# Patient Record
Sex: Male | Born: 1950 | Race: Black or African American | Hispanic: No | Marital: Single | Smoking: Current some day smoker
Health system: Southern US, Community
[De-identification: ages and names within clinical notes are randomized; demographics above are authoritative.]

## PROBLEM LIST (undated history)

## (undated) DIAGNOSIS — B20 Human immunodeficiency virus [HIV] disease: Secondary | ICD-10-CM

## (undated) DIAGNOSIS — Z21 Asymptomatic human immunodeficiency virus [HIV] infection status: Secondary | ICD-10-CM

## (undated) DIAGNOSIS — E049 Nontoxic goiter, unspecified: Secondary | ICD-10-CM

## (undated) HISTORY — DX: Nontoxic goiter, unspecified: E04.9

## (undated) HISTORY — DX: Human immunodeficiency virus (HIV) disease: B20

## (undated) HISTORY — DX: Asymptomatic human immunodeficiency virus (hiv) infection status: Z21

## (undated) HISTORY — PX: OTHER SURGICAL HISTORY: SHX169

---

## 2015-06-26 ENCOUNTER — Emergency Department (INDEPENDENT_AMBULATORY_CARE_PROVIDER_SITE_OTHER)
Admission: EM | Admit: 2015-06-26 | Discharge: 2015-06-26 | Disposition: A | Discharge status: Home / Self Care | Payer: Worker's Compensation | Source: Home / Self Care | Attending: Family Medicine | Admitting: Family Medicine

## 2015-06-26 ENCOUNTER — Encounter (HOSPITAL_COMMUNITY): Payer: Self-pay | Admitting: Emergency Medicine

## 2015-06-26 DIAGNOSIS — S20412A Abrasion of left back wall of thorax, initial encounter: Secondary | ICD-10-CM

## 2015-06-26 MED ORDER — NAPROXEN 500 MG PO TABS: 500.0000 mg | ORAL_TABLET | Freq: Two times a day (BID) | ORAL | Status: DC

## 2015-06-26 NOTE — ED Provider Notes (Signed)
CSN: [id number]     Arrival date & time 06/26/15  1654 History   First MD Initiated Contact with Patient 06/26/15 1954     Chief Complaint  Patient presents with  . Back Injury   (Consider location/radiation/quality/duration/timing/severity/associated sxs/prior Treatment) The history is provided by the patient. No language interpreter was used.  Patient presents for evaluation following an injury that occurred on the job today at around 4:30pm.  Was hit in the back by a large roll of fabric that came out of place. Did not knock him over. Sustained an abrasion over the L lower back. No trouble walking or using lower extremities. No prior history of back problems or injury that he can recall.  Came for evaluation.  PMHx he denies any past medical history. Does not have medication allergies and takes no medications. Has not taken anything for pain since today's incident. No history of gastritis or GI bleeding.   Social Hx; Drinks alcohol on occasion.   History reviewed. No pertinent past medical history. History reviewed. No pertinent past surgical history. History reviewed. No pertinent family history. Social History  Substance Use Topics  . Smoking status: Current Some Day Smoker -- 0.50 packs/day    Types: Cigarettes  . Smokeless tobacco: None  . Alcohol Use: Yes    Review of Systems  Constitutional: Negative for fever, chills, diaphoresis and fatigue.  Genitourinary:       No history of urinary or fecal incontinence.   Neurological: Negative for weakness.  All other systems reviewed and are negative.   Allergies  Review of patient's allergies indicates no known allergies.  Home Medications   Prior to Admission medications   Medication Sig Start Date End Date Taking? Authorizing Provider  naproxen (NAPROSYN) 500 MG tablet Take 1 tablet (500 mg total) by mouth 2 (two) times daily with a meal. 06/26/15   Willeen Niece, MD   Meds Ordered and Administered this Visit   Medications - No data to display  There were no vitals taken for this visit. No data found.   Physical Exam  Constitutional: He appears well-developed and well-nourished. No distress.  HENT:  Head: Normocephalic and atraumatic.  Musculoskeletal:  Area of redness without laceration along L iliac crest. No paraspinous mm tenderness or bony tenderness over vertebral processes.   UEs full active ROM and strength. Handgrip full and symmetric.   LEs full strength with bilat hip flexion, ab/adduction, and int/ext rotation.  Negative SLR.   Neurological:  Dorsi/plantarflexion bilat intact and full. Sensation grossly intact bilat. DTRs patellar 1+ symmetric bilaterally.     Skin: Skin is warm and dry. He is not diaphoretic.    ED Course  Procedures (including critical care time)  Labs Review Labs Reviewed - No data to display  Imaging Review No results found.   Visual Acuity Review  Right Eye Distance:   Left Eye Distance:   Bilateral Distance:    Right Eye Near:   Left Eye Near:    Bilateral Near:         MDM   1. Abrasion of back, left, initial encounter    Patient with L lower back abrasion, no laceration. MSK exam is unremarkable, I do not believe imaging is necessary.  Ice to area; NSAIDs with food this evening and tomorrow as needed. No restrictions at this time.   Dalbert Mayotte, MD  Willeen Niece, MD 06/26/15 2014

## 2015-06-26 NOTE — Discharge Instructions (Signed)
It is a pleasure to see you today.    For the abrasion of your back, I recommend applying ice to the affected area tonight, and tomorrow if needed.   I am prescribing an anti-inflammatory, NAPROXEN 500mg  tablets, take 1 tablet by mouth twice daily with something to eat.  May cause upset stomach.   No other restrictions for work at this time.

## 2015-06-26 NOTE — ED Notes (Signed)
Patient reports that a ream of paper fell onto his back this afternoon around 4:30 pm. Patient reports the left side of his back hurts. Patient is in NAD.

## 2016-10-07 ENCOUNTER — Ambulatory Visit (HOSPITAL_COMMUNITY)
Admission: EM | Admit: 2016-10-07 | Discharge: 2016-10-07 | Disposition: A | Discharge status: Home / Self Care | Payer: BLUE CROSS/BLUE SHIELD | Attending: Family Medicine | Admitting: Family Medicine

## 2016-10-07 ENCOUNTER — Encounter (HOSPITAL_COMMUNITY): Payer: Self-pay | Admitting: Emergency Medicine

## 2016-10-07 DIAGNOSIS — L03032 Cellulitis of left toe: Secondary | ICD-10-CM | POA: Diagnosis not present

## 2016-10-07 DIAGNOSIS — L732 Hidradenitis suppurativa: Secondary | ICD-10-CM

## 2016-10-07 MED ORDER — SULFAMETHOXAZOLE-TRIMETHOPRIM 800-160 MG PO TABS: 1.0000 | ORAL_TABLET | Freq: Two times a day (BID) | ORAL | 0 refills | Status: AC

## 2016-10-07 NOTE — Discharge Instructions (Addendum)
Today you were diagnosed with the following: 1. Paronychia of toe of left foot   2. Hydradenitis    You have been prescribed prescription medications this visit.   Meds ordered this encounter  Medications   ibuprofen (ADVIL,MOTRIN) 200 MG tablet    Sig: Take 200 mg by mouth every 6 (six) hours as needed.   sulfamethoxazole-trimethoprim (BACTRIM DS,SEPTRA DS) 800-160 MG tablet    Sig: Take 1 tablet by mouth 2 (two) times daily.    Dispense:  14 tablet    Refill:  0   Please take and finish your antibiotic. If you are not improving over the next few days or feel you are worsening please follow up here or the Emergency Department if you are unable to see your regular doctor.

## 2016-10-07 NOTE — ED Provider Notes (Signed)
  [name]   [id number] 10/07/16 Arrival Time: 1212  ASSESSMENT & PLAN:  Today you were diagnosed with the following: 1. Paronychia of toe of left foot   2. Hydradenitis    You have been prescribed prescription medications this visit.   Meds ordered this encounter  Medications  . ibuprofen (ADVIL,MOTRIN) 200 MG tablet    Sig: Take 200 mg by mouth every 6 (six) hours as needed.  . sulfamethoxazole-trimethoprim (BACTRIM DS,SEPTRA DS) 800-160 MG tablet    Sig: Take 1 tablet by mouth 2 (two) times daily.    Dispense:  14 tablet    Refill:  0   Please take and finish your antibiotic.  If you are not improving over the next few days or feel you are worsening please follow up here or the Emergency Department if you are unable to see your regular doctor.  Paronychia of L great toe cleaned with alcohol. #11 scalpel used to gently lift and open. Scant purulent drainage expressed. Not enough to get adequate culture. No complications. Pt tolerated well. No bleeding.  Wound care instructions discussed. Reviewed expectations re: course of current medical issues. Questions answered. Outlined signs and symptoms indicating need for more acute intervention. Patient verbalized understanding. After Visit Summary given.   SUBJECTIVE:  Juan Johnston is a 66 y.o. male who presents with complaint of questionable infection of L great toe. Noticed a few days ago. Slight tenderness. No injury. Afebrile. No drainage.  Also questions abscess in R axilla. Several weeks. Not very painful. No drainage. H/O similar in the past that have drained spontaneously.  ROS: As per HPI.   OBJECTIVE:  Vitals:   10/07/16 1302  BP: (!) 152/85  Pulse: 73  Resp: 20  Temp: 98.1 F (36.7 C)  TempSrc: Oral  SpO2: 100%    General appearance: alert; no distress Extremities: L great toe with paronychia; approx 1 cm around proximal nail; no significant erythema; toe with FROM MSK: symmetrical with  no gross deformities R axilla with approx 1 cm nodule vs cyst under skin; no tenderness; no fluctuance  No Known Allergies  PMHx, SurgHx, SocialHx, Medications, and Allergies were reviewed in the Visit Navigator and updated as appropriate.      Vanessa Kick, MD 10/07/16 1409

## 2016-10-07 NOTE — ED Triage Notes (Signed)
Abscess to right axilla Bilateral great toe pain and swelling around nail bed.

## 2017-02-06 ENCOUNTER — Emergency Department (HOSPITAL_COMMUNITY)
Admission: EM | Admit: 2017-02-06 | Discharge: 2017-02-06 | Disposition: A | Discharge status: Home / Self Care | Payer: Self-pay | Attending: Physician Assistant | Admitting: Physician Assistant

## 2017-02-06 ENCOUNTER — Encounter (HOSPITAL_COMMUNITY): Payer: Self-pay | Admitting: Emergency Medicine

## 2017-02-06 ENCOUNTER — Emergency Department (HOSPITAL_COMMUNITY): Payer: Self-pay

## 2017-02-06 DIAGNOSIS — J189 Pneumonia, unspecified organism: Secondary | ICD-10-CM | POA: Insufficient documentation

## 2017-02-06 DIAGNOSIS — J181 Lobar pneumonia, unspecified organism: Secondary | ICD-10-CM

## 2017-02-06 DIAGNOSIS — F17210 Nicotine dependence, cigarettes, uncomplicated: Secondary | ICD-10-CM | POA: Insufficient documentation

## 2017-02-06 LAB — COMPREHENSIVE METABOLIC PANEL
ALBUMIN: 3.4 g/dL — AB (ref 3.5–5.0)
ALT: 16 U/L — ABNORMAL LOW (ref 17–63)
AST: 18 U/L (ref 15–41)
Alkaline Phosphatase: 91 U/L (ref 38–126)
Anion gap: 10 (ref 5–15)
BUN: 8 mg/dL (ref 6–20)
CHLORIDE: 101 mmol/L (ref 101–111)
CO2: 25 mmol/L (ref 22–32)
CREATININE: 0.86 mg/dL (ref 0.61–1.24)
Calcium: 8.9 mg/dL (ref 8.9–10.3)
GFR calc non Af Amer: >60 mL/min (ref >60)
Glucose, Bld: 120 mg/dL — ABNORMAL HIGH (ref 65–99)
POTASSIUM: 3.6 mmol/L (ref 3.5–5.1)
SODIUM: 136 mmol/L (ref 135–145)
Total Bilirubin: 0.7 mg/dL (ref 0.3–1.2)
Total Protein: 7.2 g/dL (ref 6.5–8.1)

## 2017-02-06 LAB — CBC
HEMATOCRIT: 41.2 % (ref 39.0–52.0)
HEMOGLOBIN: 13.9 g/dL (ref 13.0–17.0)
MCH: 31.5 pg (ref 26.0–34.0)
MCHC: 33.7 g/dL (ref 30.0–36.0)
MCV: 93.4 fL (ref 78.0–100.0)
PLATELETS: 157 10*3/uL (ref 150–400)
RBC: 4.41 MIL/uL (ref 4.22–5.81)
RDW: 12.6 % (ref 11.5–15.5)
WBC: 4.8 10*3/uL (ref 4.0–10.5)

## 2017-02-06 LAB — URINALYSIS, ROUTINE W REFLEX MICROSCOPIC
Bilirubin Urine: NEGATIVE
GLUCOSE, UA: NEGATIVE mg/dL
HGB URINE DIPSTICK: NEGATIVE
Ketones, ur: NEGATIVE mg/dL
LEUKOCYTES UA: NEGATIVE
Nitrite: NEGATIVE
PH: 5 (ref 5.0–8.0)
Protein, ur: NEGATIVE mg/dL
SPECIFIC GRAVITY, URINE: 1.021 (ref 1.005–1.030)

## 2017-02-06 LAB — LIPASE, BLOOD: LIPASE: 41 U/L (ref 11–51)

## 2017-02-06 MED ORDER — AZITHROMYCIN 250 MG PO TABS: ORAL_TABLET | ORAL | 0 refills | Status: DC

## 2017-02-06 MED ORDER — DOXYCYCLINE HYCLATE 100 MG PO CAPS: 100.0000 mg | ORAL_CAPSULE | Freq: Two times a day (BID) | ORAL | 0 refills | Status: AC

## 2017-02-06 NOTE — ED Triage Notes (Signed)
Pt. Stated, I started having left stomach pain for 4 hours this morning. No other symptoms

## 2017-02-06 NOTE — Discharge Instructions (Signed)
Please read and follow all provided instructions.  Your diagnoses today include:  1. Community acquired pneumonia of left lower lobe of lung ([place])     Tests performed today include: Vital signs. See below for your results today.   Medications prescribed:  Take as prescribed   Home care instructions:  Follow any educational materials contained in this packet.  Follow-up instructions: Please follow-up with your primary care provider for further evaluation of symptoms and treatment   Return instructions:  Please return to the Emergency Department if you do not get better, if you get worse, or new symptoms OR  - Fever (temperature greater than 101.47F)  - Bleeding that does not stop with holding pressure to the area    -Severe pain (please note that you may be more sore the day after your accident)  - Chest Pain  - Difficulty breathing  - Severe nausea or vomiting  - Inability to tolerate food and liquids  - Passing out  - Skin becoming red around your wounds  - Change in mental status (confusion or lethargy)  - New numbness or weakness    Please return if you have any other emergent concerns.  Additional Information:  Your vital signs today were: BP 116/74    Pulse 76    Resp 20    Ht 5\' 7"  (1.702 m)    Wt 64.9 kg (143 lb)    SpO2 99%    BMI 22.40 kg/m  If your blood pressure (BP) was elevated above 135/85 this visit, please have this repeated by your doctor within one month. ---------------

## 2017-02-06 NOTE — ED Notes (Signed)
Patient in Xray

## 2017-02-06 NOTE — ED Provider Notes (Signed)
[facility] EMERGENCY DEPARTMENT Provider Note   CSN: [id number] Arrival date & time: 02/06/17  6010     History   Chief Complaint Chief Complaint  Patient presents with  . Abdominal Pain    HPI Juan Johnston is a 66 y.o. male.  HPI  65 y.o. male, presents to the Emergency Department today due to left lower chest pain. Pt states this morning. Notes worsening with deep breathing and with coughing. Denies pain at rest. Denies abdominal pain. No N/V. No diaphoresis. Denies pain currently. Pt does endorse non productive cough. No sick contacts. No fevers. No rhinorrhea or sinus congestion. No meds PTA. Otherwise unremarkable medical history. No hx ACS. No FH. No hx DVT/PE. No recent travel. No recent surgeries. No other symptoms noted    History reviewed. No pertinent past medical history.  There are no active problems to display for this patient.   History reviewed. No pertinent surgical history.     Home Medications    Prior to Admission medications   Medication Sig Start Date End Date Taking? Authorizing Provider  ibuprofen (ADVIL,MOTRIN) 200 MG tablet Take 200 mg by mouth every 6 (six) hours as needed.    [provider]  naproxen (NAPROSYN) 500 MG tablet Take 1 tablet (500 mg total) by mouth 2 (two) times daily with a meal. 06/26/15   Willeen Niece, MD    Family History No family history on file.  Social History Social History   Tobacco Use  . Smoking status: Current Some Day Smoker    Packs/day: 0.50    Types: Cigarettes  . Smokeless tobacco: Current User  Substance Use Topics  . Alcohol use: Yes  . Drug use: Not on file     Allergies   Patient has no known allergies.   Review of Systems Review of Systems ROS reviewed and all are negative for acute change except as noted in the HPI.  Physical Exam Updated Vital Signs BP 125/74   Pulse 79   Ht 5\' 7"  (1.702 m)   Wt 64.9 kg (143 lb)   SpO2 97%   BMI 22.40 kg/m    Physical Exam  Constitutional: He is oriented to person, place, and time. Vital signs are normal. He appears well-developed and well-nourished. No distress.  HENT:  Head: Normocephalic and atraumatic.  Right Ear: Hearing, tympanic membrane, external ear and ear canal normal.  Left Ear: Hearing, tympanic membrane, external ear and ear canal normal.  Nose: Nose normal.  Mouth/Throat: Uvula is midline, oropharynx is clear and moist and mucous membranes are normal. No trismus in the jaw. No oropharyngeal exudate, posterior oropharyngeal erythema or tonsillar abscesses.  Eyes: Conjunctivae and EOM are normal. Pupils are equal, round, and reactive to light.  Neck: Normal range of motion. Neck supple. No tracheal deviation present.  Cardiovascular: Normal rate, regular rhythm, S1 normal, S2 normal, normal heart sounds, intact distal pulses and normal pulses.  Pulmonary/Chest: Effort normal and breath sounds normal. No respiratory distress. He has no decreased breath sounds. He has no wheezes. He has no rhonchi. He has no rales.  Abdominal: Normal appearance and bowel sounds are normal. There is no tenderness. There is no rigidity, no rebound, no guarding, no CVA tenderness, no tenderness at McBurney's point and negative Murphy's sign.  Musculoskeletal: Normal range of motion.  Neurological: He is alert and oriented to person, place, and time.  Skin: Skin is warm and dry.  Psychiatric: He has a normal mood and affect. His  speech is normal and behavior is normal. Thought content normal.  Nursing note and vitals reviewed.    ED Treatments / Results  Labs (all labs ordered are listed, but only abnormal results are displayed) Labs Reviewed  COMPREHENSIVE METABOLIC PANEL - Abnormal; Notable for the following components:      Result Value   Glucose, Bld 120 (*)    Albumin 3.4 (*)    ALT 16 (*)    All other components within normal limits  LIPASE, BLOOD  CBC  URINALYSIS, ROUTINE W REFLEX  MICROSCOPIC    EKG  EKG Interpretation None       Radiology Dg Chest 2 View  Result Date: 02/06/2017 CLINICAL DATA:  66 year old male with left-sided chest pain. EXAM: CHEST  2 VIEW COMPARISON:  None. FINDINGS: The heart size and mediastinal contours are within normal limits. Focal opacities identified in the left lung base, suspicious for consolidation. The right lung is clear. No sizeable effusions or pneumothorax. The visualized skeletal structures are unremarkable. IMPRESSION: Left basilar opacity suspicious for consolidation. Correlate clinically for signs of infection or trauma. If further imaging evaluation is needed, contrast-enhanced CT would be the study of choice. Electronically Signed   By: Kristopher Oppenheim M.D.   On: 02/06/2017 11:05    Procedures Procedures (including critical care time)  Medications Ordered in ED Medications - No data to display   Initial Impression / Assessment and Plan / ED Course  I have reviewed the triage vital signs and the nursing notes.  Pertinent labs & imaging results that were available during my care of the patient were reviewed by me and considered in my medical decision making (see chart for details).  Final Clinical Impressions(s) / ED Diagnoses  {I have reviewed and evaluated the relevant laboratory values. {I have reviewed and evaluated the relevant imaging studies. {I have interpreted the relevant EKG. {I have reviewed the relevant previous healthcare records.  {I obtained HPI from historian. {Patient discussed with supervising physician.  ED Course:  Assessment: Pt is a 66 y.o. male  presents to the Emergency Department today due to left lower chest pain. Pt states this morning. Notes worsening with deep breathing and with coughing. Denies pain at rest. Denies abdominal pain. No N/V. No diaphoresis. Denies pain currently. Pt does endorse non productive cough. No sick contacts. No fevers. No rhinorrhea or sinus congestion. No meds PTA.  Otherwise unremarkable medical history. No hx ACS. No FH. No hx DVT/PE. No recent travel. No recent surgeries. On exam, pt in NAD. Nontoxic/nonseptic appearing. VSS. Afebrile. Lungs CTA. Heart RRR. Abdomen nontender soft. CBC unremarkable. BMP unremarkable. CXR with LLL consolidation suspicious for pneumonia. Low suspicion for PE given low risk factors. Plan is to DC home with ABX and close follow up to PCP. Given strict return precautions if symptoms do not improve with ABX. At time of discharge, Patient is in no acute distress. Vital Signs are stable. Patient is able to ambulate. Patient able to tolerate PO.   Disposition/Plan:  DC Home Additional Verbal discharge instructions given and discussed with patient.  Pt Instructed to f/u with PCP in the next week for evaluation and treatment of symptoms. Return precautions given Pt acknowledges and agrees with plan  Supervising Physician Mackuen, Courteney Lyn, *  Final diagnoses:  Community acquired pneumonia of left lower lobe of lung ([place])    New Prescriptions This SmartLink is deprecated. Use AVSMEDLIST instead to display the medication list for a patient.   Shary Decamp, PA-C 02/06/17 1119  Macarthur Critchley, MD 02/08/17 [phone]

## 2017-02-13 ENCOUNTER — Encounter (HOSPITAL_COMMUNITY): Payer: Self-pay

## 2017-02-13 ENCOUNTER — Emergency Department (HOSPITAL_COMMUNITY)
Admission: EM | Admit: 2017-02-13 | Discharge: 2017-02-13 | Disposition: A | Discharge status: Home / Self Care | Payer: Medicare Other | Attending: Emergency Medicine | Admitting: Emergency Medicine

## 2017-02-13 DIAGNOSIS — Z09 Encounter for follow-up examination after completed treatment for conditions other than malignant neoplasm: Secondary | ICD-10-CM

## 2017-02-13 DIAGNOSIS — B370 Candidal stomatitis: Secondary | ICD-10-CM

## 2017-02-13 MED ORDER — NYSTATIN 100000 UNIT/ML MT SUSP: 500000.0000 [IU] | Freq: Four times a day (QID) | OROMUCOSAL | 0 refills | Status: DC

## 2017-02-13 NOTE — ED Triage Notes (Signed)
Patient here requesting recheck for diagnosis of pneumonia last week, has been taking antibiotic as instructed and complains of some intermittent ongoing left rib pain, NAD

## 2017-02-13 NOTE — ED Provider Notes (Signed)
[facility] EMERGENCY DEPARTMENT Provider Note   CSN: [id number] Arrival date & time: 02/13/17  1531     History   Chief Complaint No chief complaint on file.   HPI Juan Johnston is a 66 y.o. male with recent diagnosis of pneumonia who presents for recheck.  He reports he is still coughing a little bit and having very intermittent left rib pain, however he is feeling much better.  He denies any fevers.  He denies any chest pain or shortness of breath.  He has finished his azithromycin and doxycycline.  He reports it did make him constipated, however he has had a large bowel movement this morning.  He has also had a decreased appetite and feels like he may have lost weight over the course of his illness.  He does not have a primary care provider.  HPI  History reviewed. No pertinent past medical history.  There are no active problems to display for this patient.   History reviewed. No pertinent surgical history.     Home Medications    Prior to Admission medications   Medication Sig Start Date End Date Taking? Authorizing Provider  azithromycin (ZITHROMAX Z-PAK) 250 MG tablet 500mg  PO once daily on day 1. THEN 250mg  PO once daily for 4 days 02/06/17   Shary Decamp, PA-C  doxycycline (VIBRAMYCIN) 100 MG capsule Take 1 capsule (100 mg total) 2 (two) times daily for 7 days by mouth. 02/06/17 02/13/17  Shary Decamp, PA-C  ibuprofen (ADVIL,MOTRIN) 200 MG tablet Take 400 mg every 6 (six) hours as needed by mouth for moderate pain.     [provider]  naproxen (NAPROSYN) 500 MG tablet Take 1 tablet (500 mg total) by mouth 2 (two) times daily with a meal. Patient not taking: Reported on 02/06/2017 06/26/15   Willeen Niece, MD  nystatin (MYCOSTATIN) 100000 UNIT/ML suspension Take 5 mLs (500,000 Units total) 4 (four) times daily by mouth. 02/13/17   Vander Kueker, Bea Graff, PA-C    Family History No family history on file.  Social History Social History    Tobacco Use  . Smoking status: Current Some Day Smoker    Packs/day: 0.50    Types: Cigarettes  . Smokeless tobacco: Current User  Substance Use Topics  . Alcohol use: Yes  . Drug use: Not on file     Allergies   Patient has no known allergies.   Review of Systems Review of Systems  Constitutional: Positive for appetite change. Negative for fever.  Respiratory: Positive for cough. Negative for shortness of breath.   Cardiovascular: Negative for chest pain.  Musculoskeletal:       L rib pain     Physical Exam Updated Vital Signs BP (!) 154/104 (BP Location: Right Arm)   Pulse 94   Temp (!) 97.5 F (36.4 C) (Oral)   Resp 16   Ht 5\' 7"  (1.702 m)   SpO2 100%   BMI 22.40 kg/m   Physical Exam  Constitutional: He appears well-developed and well-nourished. No distress.  HENT:  Head: Normocephalic and atraumatic.  Mouth/Throat: Oropharynx is clear and moist. No oropharyngeal exudate.  White, coating to bilateral sides of the soft palate (no complaints related  Eyes: Conjunctivae are normal. Pupils are equal, round, and reactive to light. Right eye exhibits no discharge. Left eye exhibits no discharge. No scleral icterus.  Neck: Normal range of motion. Neck supple. No thyromegaly present.  Cardiovascular: Normal rate, regular rhythm, normal heart sounds and intact distal pulses.  Exam reveals no gallop and no friction rub.  No murmur heard. Pulmonary/Chest: Effort normal and breath sounds normal. No stridor. No respiratory distress. He has no wheezes. He has no rales.  Abdominal: Soft. Bowel sounds are normal. He exhibits no distension. There is no tenderness. There is no rebound and no guarding.  Musculoskeletal: He exhibits no edema.  Lymphadenopathy:    He has no cervical adenopathy.  Neurological: He is alert. Coordination normal.  Skin: Skin is warm and dry. No rash noted. He is not diaphoretic. No pallor.  Psychiatric: He has a normal mood and affect.  Nursing  note and vitals reviewed.    ED Treatments / Results  Labs (all labs ordered are listed, but only abnormal results are displayed) Labs Reviewed - No data to display  EKG  EKG Interpretation None       Radiology No results found.  Procedures Procedures (including critical care time)  Medications Ordered in ED Medications - No data to display   Initial Impression / Assessment and Plan / ED Course  I have reviewed the triage vital signs and the nursing notes.  Pertinent labs & imaging results that were available during my care of the patient were reviewed by me and considered in my medical decision making (see chart for details).     Patient with resolving symptoms of pneumonia.  Lungs are clear to auscultation.  Oxygen saturations at 100%.  Patient is afebrile.  Patient does appear to have thrush, potentially from antibiotic treatment over the past week.  Will treat with nystatin suspension.  Patient encouraged to follow-up and establish care with a primary care provider.  He is given resources.  Return precautions discussed.  Patient understands and agrees with plan.  Patient vitals stable throughout ED course and discharged in satisfactory condition. Patient also evaluated by Dr. Laverta Baltimore who guided the patient's management and agrees with plan.  Final Clinical Impressions(s) / ED Diagnoses   Final diagnoses:  Oral candidiasis  Follow-up exam    ED Discharge Orders        Ordered    nystatin (MYCOSTATIN) 100000 UNIT/ML suspension  4 times daily,   Status:  Discontinued     02/13/17 1605    nystatin (MYCOSTATIN) 100000 UNIT/ML suspension  4 times daily     02/13/17 [address] PA-C 02/13/17 1616    Margette Fast, MD 02/14/17 [id number]

## 2017-02-13 NOTE — ED Notes (Signed)
Discharge signature pad not working. Patient understands discharge instructions

## 2017-02-13 NOTE — Discharge Instructions (Signed)
Swish 5 mL of nystatin for as long as possible 4 times daily.  Swallow the medicine.  Do this for 14 days.  Please follow-up and establish care with a primary care provider for further evaluation and treatment.  Please return to the emergency department if you develop any increasing chest pain, shortness of breath, fevers over 100.4 F, or any other new or concerning symptoms.

## 2017-02-26 ENCOUNTER — Emergency Department (HOSPITAL_COMMUNITY)
Admission: EM | Admit: 2017-02-26 | Discharge: 2017-02-26 | Disposition: A | Discharge status: Home / Self Care | Payer: Medicare Other | Attending: Emergency Medicine | Admitting: Emergency Medicine

## 2017-02-26 ENCOUNTER — Emergency Department (HOSPITAL_COMMUNITY): Payer: Medicare Other

## 2017-02-26 ENCOUNTER — Encounter (HOSPITAL_COMMUNITY): Payer: Self-pay

## 2017-02-26 DIAGNOSIS — R0789 Other chest pain: Secondary | ICD-10-CM

## 2017-02-26 DIAGNOSIS — F17228 Nicotine dependence, chewing tobacco, with other nicotine-induced disorders: Secondary | ICD-10-CM | POA: Insufficient documentation

## 2017-02-26 DIAGNOSIS — D649 Anemia, unspecified: Secondary | ICD-10-CM

## 2017-02-26 DIAGNOSIS — F17210 Nicotine dependence, cigarettes, uncomplicated: Secondary | ICD-10-CM | POA: Insufficient documentation

## 2017-02-26 LAB — CBC
HCT: 37 % — ABNORMAL LOW (ref 39.0–52.0)
Hemoglobin: 12 g/dL — ABNORMAL LOW (ref 13.0–17.0)
MCH: 30.3 pg (ref 26.0–34.0)
MCHC: 32.4 g/dL (ref 30.0–36.0)
MCV: 93.4 fL (ref 78.0–100.0)
PLATELETS: 263 10*3/uL (ref 150–400)
RBC: 3.96 MIL/uL — ABNORMAL LOW (ref 4.22–5.81)
RDW: 12.7 % (ref 11.5–15.5)
WBC: 4.1 10*3/uL (ref 4.0–10.5)

## 2017-02-26 LAB — BASIC METABOLIC PANEL
Anion gap: 10 (ref 5–15)
BUN: 11 mg/dL (ref 6–20)
CALCIUM: 9.1 mg/dL (ref 8.9–10.3)
CO2: 27 mmol/L (ref 22–32)
CREATININE: 0.84 mg/dL (ref 0.61–1.24)
Chloride: 99 mmol/L — ABNORMAL LOW (ref 101–111)
GFR calc Af Amer: >60 mL/min (ref >60)
GLUCOSE: 115 mg/dL — AB (ref 65–99)
Potassium: 3.6 mmol/L (ref 3.5–5.1)
SODIUM: 136 mmol/L (ref 135–145)

## 2017-02-26 LAB — I-STAT TROPONIN, ED: TROPONIN I, POC: 0 ng/mL (ref 0.00–0.08)

## 2017-02-26 LAB — POC OCCULT BLOOD, ED: FECAL OCCULT BLD: NEGATIVE

## 2017-02-26 MED ORDER — PANTOPRAZOLE SODIUM 20 MG PO TBEC: 20.0000 mg | DELAYED_RELEASE_TABLET | Freq: Every day | ORAL | 0 refills | Status: DC

## 2017-02-26 MED ORDER — PANTOPRAZOLE SODIUM 40 MG PO TBEC
40.0000 mg | DELAYED_RELEASE_TABLET | Freq: Every day | ORAL | Status: DC
  Administered 2017-02-26: 40 mg via ORAL
  Filled 2017-02-26: qty 1

## 2017-02-26 MED ORDER — GI COCKTAIL ~~LOC~~
30.0000 mL | Freq: Once | ORAL | Status: AC
  Administered 2017-02-26: 30 mL via ORAL
  Filled 2017-02-26: qty 30

## 2017-02-26 NOTE — ED Triage Notes (Signed)
Patient complains of chest discomfort when he eats. States that he feels as if the food is stopping in his chest. Patient had recent tx for pneumonia and also still coughing up yellowish sputum, alert and oriented, NAD

## 2017-02-26 NOTE — ED Notes (Signed)
ED Provider at bedside. 

## 2017-02-26 NOTE — Discharge Instructions (Signed)
Avoid alcohol use and nonsteroidal drugs such as ibuprofen/Aleve/naproxen. Soft/liquid foods until you see gastroenterologist. For pain take Tylenol and acid suppression medications. Have chest xray repeated in 6 weeks.   Please come to the emergency room if you develop recurrent bleeding, uncontrolled pain or worsening symptoms. Follow-up closely with gastroenterology for further workup and to discuss camera to see more details of your pain with eating.

## 2017-02-26 NOTE — ED Provider Notes (Signed)
[facility] EMERGENCY DEPARTMENT Provider Note   CSN: [id number] Arrival date & time: 02/26/17  1437     History   Chief Complaint No chief complaint on file.   HPI Juan Johnston is a 66 y.o. male.  Patient with history of alcohol and cigarette smoking use presents with  chest discomfort.patient has had recurrent pain and difficulty with eating for the past week. No history of similar. Patient's had just over 10 pounds weight loss. Patient is a chronic smoker and alcohol user he stopped 3 weeks ago when he had pneumonia.  Patient's cough is almost resolved without fever or chills.patient did complete antibiotics. Patient denies abdominal pain or blood/melanotic stools.      History reviewed. No pertinent past medical history.  There are no active problems to display for this patient.   History reviewed. No pertinent surgical history.     Home Medications    Prior to Admission medications   Medication Sig Start Date End Date Taking? Authorizing Provider  azithromycin (ZITHROMAX Z-PAK) 250 MG tablet 500mg  PO once daily on day 1. THEN 250mg  PO once daily for 4 days 02/06/17   Shary Decamp, PA-C  ibuprofen (ADVIL,MOTRIN) 200 MG tablet Take 400 mg every 6 (six) hours as needed by mouth for moderate pain.     [provider]  naproxen (NAPROSYN) 500 MG tablet Take 1 tablet (500 mg total) by mouth 2 (two) times daily with a meal. Patient not taking: Reported on 02/06/2017 06/26/15   Willeen Niece, MD  nystatin (MYCOSTATIN) 100000 UNIT/ML suspension Take 5 mLs (500,000 Units total) 4 (four) times daily by mouth. 02/13/17   Law, Bea Graff, PA-C  pantoprazole (PROTONIX) 20 MG tablet Take 1 tablet (20 mg total) by mouth daily. 02/26/17   Elnora Morrison, MD    Family History No family history on file.  Social History Social History   Tobacco Use  . Smoking status: Current Some Day Smoker    Packs/day: 0.50    Types: Cigarettes  . Smokeless  tobacco: Current User  Substance Use Topics  . Alcohol use: Yes  . Drug use: Not on file     Allergies   Patient has no known allergies.   Review of Systems Review of Systems  Constitutional: Negative for chills and fever.  HENT: Negative for congestion.   Eyes: Negative for visual disturbance.  Respiratory: Negative for shortness of breath.   Cardiovascular: Positive for chest pain. Negative for leg swelling.  Gastrointestinal: Negative for abdominal pain and vomiting.  Genitourinary: Negative for dysuria and flank pain.  Musculoskeletal: Negative for back pain, neck pain and neck stiffness.  Skin: Negative for rash.  Neurological: Negative for light-headedness and headaches.      Physical Exam Updated Vital Signs BP 136/75   Pulse 71   Temp 97.9 F (36.6 C) (Oral)   Resp 15   SpO2 97%   Physical Exam  Constitutional: He is oriented to person, place, and time. He appears well-developed and well-nourished.  HENT:  Head: Normocephalic and atraumatic.  Eyes: Conjunctivae are normal. Right eye exhibits no discharge. Left eye exhibits no discharge.  Neck: Normal range of motion. Neck supple. No tracheal deviation present.  Cardiovascular: Normal rate and regular rhythm.  Pulmonary/Chest: Effort normal and breath sounds normal.  Abdominal: Soft. He exhibits no distension. There is no tenderness. There is no guarding.  Musculoskeletal: He exhibits no edema.  Neurological: He is alert and oriented to person, place, and time.  Skin:  Skin is warm. No rash noted.  Psychiatric: He has a normal mood and affect.  Nursing note and vitals reviewed.    ED Treatments / Results  Labs (all labs ordered are listed, but only abnormal results are displayed) Labs Reviewed  BASIC METABOLIC PANEL - Abnormal; Notable for the following components:      Result Value   Chloride 99 (*)    Glucose, Bld 115 (*)    All other components within normal limits  CBC - Abnormal; Notable for the  following components:   RBC 3.96 (*)    Hemoglobin 12.0 (*)    HCT 37.0 (*)    All other components within normal limits  I-STAT TROPONIN, ED  POC OCCULT BLOOD, ED    EKG  EKG Interpretation  Date/Time:  Saturday February 26 2017 14:53:57 EST Ventricular Rate:  85 PR Interval:    QRS Duration: 89 QT Interval:  377 QTC Calculation: 449 R Axis:   28 Text Interpretation:  Sinus rhythm Abnormal R-wave progression, early transition Nonspecific T abnormalities, Baseline wander in lead(s) I III aVR aVL aVF V2 Confirmed by Elnora Morrison [id number]) on 02/26/2017 3:49:29 PM       Radiology Dg Chest 2 View  Result Date: 02/26/2017 CLINICAL DATA:  Midline chest pain with eating and shortness of breath for 3 days. EXAM: CHEST  2 VIEW COMPARISON:  PA and lateral chest 02/06/2017. FINDINGS: Left basilar airspace disease seen on the prior examination has resolved. There is new volume loss in the left lower lobe and pleural thickening or fluid along the lateral aspect of the left chest wall. Right lung is expanded and clear. Heart size is normal. No pneumothorax. IMPRESSION: New pleural thickening or fluid along the lateral aspect of the left chest wall and volume loss in the left lower lobe is likely the sequelae of pneumonia seen on the prior examination. Repeat PA and lateral chest films in 6- 8 weeks to ensure stability recommended. Electronically Signed   By: Inge Rise M.D.   On: 02/26/2017 15:51    Procedures Procedures (including critical care time)  Medications Ordered in ED Medications  pantoprazole (PROTONIX) EC tablet 40 mg (40 mg Oral Given 02/26/17 1634)  gi cocktail (Maalox,Lidocaine,Donnatal) (30 mLs Oral Given 02/26/17 1636)     Initial Impression / Assessment and Plan / ED Course  I have reviewed the triage vital signs and the nursing notes.  Pertinent labs & imaging results that were available during my care of the patient were reviewed by me and considered in my  medical decision making (see chart for details).     Patient presents with discomfort after eating anything more than liquids. Patient has had weight loss and history of smoking. Patient also has mild drop in hemoglobin. Discussed differential diagnosis in importance of avoiding alcohol and following up with gastroenterologist and primary doctor. No indication for admission at this time.  Results and differential diagnosis were discussed with the patient/parent/guardian. Xrays were independently reviewed by myself.  Close follow up outpatient was discussed, comfortable with the plan.   Medications  pantoprazole (PROTONIX) EC tablet 40 mg (40 mg Oral Given 02/26/17 1634)  gi cocktail (Maalox,Lidocaine,Donnatal) (30 mLs Oral Given 02/26/17 1636)    Vitals:   02/26/17 1450 02/26/17 1600 02/26/17 1630  BP: 116/78 (!) 133/95 136/75  Pulse: 90 76 71  Resp:  19 15  Temp: 97.9 F (36.6 C)    TempSrc: Oral    SpO2: 100% 100% 97%  Final diagnoses:  Chest discomfort  Anemia, unspecified type     Final Clinical Impressions(s) / ED Diagnoses   Final diagnoses:  Chest discomfort  Anemia, unspecified type    ED Discharge Orders        Ordered    pantoprazole (PROTONIX) 20 MG tablet  Daily     02/26/17 1636       Elnora Morrison, MD 02/26/17 1650

## 2017-03-03 ENCOUNTER — Other Ambulatory Visit: Payer: Self-pay | Admitting: Gastroenterology

## 2017-03-03 DIAGNOSIS — R1310 Dysphagia, unspecified: Secondary | ICD-10-CM

## 2017-03-07 ENCOUNTER — Ambulatory Visit
Admission: RE | Admit: 2017-03-07 | Discharge: 2017-03-07 | Disposition: A | Discharge status: Home / Self Care | Payer: No Typology Code available for payment source | Source: Ambulatory Visit | Attending: Gastroenterology | Admitting: Gastroenterology

## 2017-03-07 ENCOUNTER — Ambulatory Visit (INDEPENDENT_AMBULATORY_CARE_PROVIDER_SITE_OTHER): Payer: Self-pay | Admitting: Physician Assistant

## 2017-03-07 ENCOUNTER — Other Ambulatory Visit: Payer: Self-pay

## 2017-03-07 ENCOUNTER — Encounter (INDEPENDENT_AMBULATORY_CARE_PROVIDER_SITE_OTHER): Payer: Self-pay | Admitting: Physician Assistant

## 2017-03-07 VITALS — BP 133/77 | HR 91 | Temp 97.9°F | Wt 140.0 lb

## 2017-03-07 DIAGNOSIS — Z114 Encounter for screening for human immunodeficiency virus [HIV]: Secondary | ICD-10-CM

## 2017-03-07 DIAGNOSIS — Z1211 Encounter for screening for malignant neoplasm of colon: Secondary | ICD-10-CM

## 2017-03-07 DIAGNOSIS — R462 Strange and inexplicable behavior: Secondary | ICD-10-CM

## 2017-03-07 DIAGNOSIS — Z131 Encounter for screening for diabetes mellitus: Secondary | ICD-10-CM

## 2017-03-07 DIAGNOSIS — Z9189 Other specified personal risk factors, not elsewhere classified: Secondary | ICD-10-CM

## 2017-03-07 DIAGNOSIS — R1310 Dysphagia, unspecified: Secondary | ICD-10-CM

## 2017-03-07 DIAGNOSIS — Z09 Encounter for follow-up examination after completed treatment for conditions other than malignant neoplasm: Secondary | ICD-10-CM

## 2017-03-07 DIAGNOSIS — R634 Abnormal weight loss: Secondary | ICD-10-CM

## 2017-03-07 DIAGNOSIS — K209 Esophagitis, unspecified without bleeding: Secondary | ICD-10-CM

## 2017-03-07 DIAGNOSIS — Z125 Encounter for screening for malignant neoplasm of prostate: Secondary | ICD-10-CM

## 2017-03-07 DIAGNOSIS — J181 Lobar pneumonia, unspecified organism: Secondary | ICD-10-CM

## 2017-03-07 DIAGNOSIS — E049 Nontoxic goiter, unspecified: Secondary | ICD-10-CM

## 2017-03-07 DIAGNOSIS — R7303 Prediabetes: Secondary | ICD-10-CM

## 2017-03-07 DIAGNOSIS — Z1159 Encounter for screening for other viral diseases: Secondary | ICD-10-CM

## 2017-03-07 DIAGNOSIS — J189 Pneumonia, unspecified organism: Secondary | ICD-10-CM

## 2017-03-07 LAB — POCT GLYCOSYLATED HEMOGLOBIN (HGB A1C): HEMOGLOBIN A1C: 5.8

## 2017-03-07 MED ORDER — PANTOPRAZOLE SODIUM 40 MG PO TBEC
40.0000 mg | DELAYED_RELEASE_TABLET | Freq: Two times a day (BID) | ORAL | 0 refills | Status: DC
Start: 2017-03-07 — End: 2017-07-19

## 2017-03-07 NOTE — Progress Notes (Signed)
Subjective:  Patient ID:  [place], male    DOB: [birth year]  Age: 66 y.o. MRN: [id number]  CC:    Hospital f/u    HPI Juan Johnston is a 66 y.o. male with a medical history of CAP, tobacco use, and oral candidiasis presents as a new patient on ED visit f/u for pneumonia.  ED visit on 02/26/17 (9 days ago). CXR impression of "New pleural thickening or fluid along the lateral aspect of the left chest wall and volume loss in the left lower lobe is likely the sequelae of pneumonia seen on the prior examination. Repeat PA and lateral chest films in 6- 8 weeks to ensure stability recommended". Pt reports feeling better but is concerned about weight loss. Lost 10lbs since having contracted pneumonia. Endorses mild night sweats everyday. Does not endorse extreme fatigue, fever, chills, nausea, vomiting, rash, hematuria, hematochezia, hemoptysis, CP, palpitations, SOB, or abdominal pain..      Outpatient Medications Prior to Visit  Medication Sig Dispense Refill  . pantoprazole (PROTONIX) 20 MG tablet Take 1 tablet (20 mg total) by mouth daily. 30 tablet 0  . azithromycin (ZITHROMAX Z-PAK) 250 MG tablet 500mg  PO once daily on day 1. THEN 250mg  PO once daily for 4 days (Patient not taking: Reported on 02/26/2017) 6 tablet 0  . naproxen (NAPROSYN) 500 MG tablet Take 1 tablet (500 mg total) by mouth 2 (two) times daily with a meal. (Patient not taking: Reported on 02/06/2017) 30 tablet 0  . nystatin (MYCOSTATIN) 100000 UNIT/ML suspension Take 5 mLs (500,000 Units total) 4 (four) times daily by mouth. (Patient not taking: Reported on 02/26/2017) 280 mL 0   No facility-administered medications prior to visit.      ROS Review of Systems  Constitutional: Negative for chills, fever and malaise/fatigue.  Eyes: Negative for blurred vision.  Respiratory: Negative for shortness of breath.   Cardiovascular: Negative for chest pain and palpitations.  Gastrointestinal: Negative for abdominal pain and  nausea.  Genitourinary: Negative for dysuria and hematuria.  Musculoskeletal: Negative for joint pain and myalgias.  Skin: Negative for rash.  Neurological: Negative for tingling and headaches.  Psychiatric/Behavioral: Negative for depression. The patient is not nervous/anxious.     Objective:   Vitals:   03/07/17 1426  BP: 133/77  Pulse: 91  Temp: 97.9 F (36.6 C)  SpO2: 96%      Physical Exam  Constitutional: He is oriented to person, place, and time.  thin, NAD, not well versed, poor comprehension skills.  HENT:  Head: Normocephalic and atraumatic.  Poor dentition  Eyes: No scleral icterus.  Neck: Normal range of motion. Neck supple. Thyromegaly present.  Cardiovascular: Normal rate, regular rhythm and normal heart sounds. Exam reveals no gallop and no friction rub.  No murmur heard. Pulmonary/Chest: Effort normal and breath sounds normal. No respiratory distress. He has no wheezes. He has no rales.  Abdominal: Soft. Bowel sounds are normal. There is no tenderness.  Musculoskeletal: He exhibits no edema.  Neurological: He is alert and oriented to person, place, and time. No cranial nerve deficit. Coordination normal.  Skin: Skin is warm and dry. No rash noted. No erythema. No pallor.  Psychiatric: He has a normal mood and affect. His behavior is normal. Thought content normal.  Vitals reviewed.    Assessment & Plan:     1. Hospital discharge follow-up - CBC with Differential - DG Chest 2 View; Future  2. Pneumonia of left lower lobe due to infectious organism ([place]) - CBC with  Differential - DG Chest 2 View; Future  3. Esophagitis - Severe esophagitis per barium swallow conducted today. Increase pantoprazole to 40 mg BID until his appointment with GI.    4. Screening for diabetes mellitus - HgB A1c 5.8% in clinic today  5. Prediabetes - A1c 5.8% in clinic today.  6. Special screening for malignant neoplasms, colon - Fecal occult blood,  imunochemical  7. Encounter for hepatitis C virus screening test for high risk patient - Hepatitis c antibody (reflex)  8. Screening for prostate cancer - PSA  9. Screening for HIV (human immunodeficiency virus) - HIV antibody  10. Loss of weight - 10 lbs since contracting pneumonia. Will also screen for underlying cause in this high risk homosexual male.   11. Goiter - Thyroid Panel With TSH  12. Strange and inexplicable behavior - Few times patient asked bizarre questions, eg can I keep my phone (before having venipuncture done), also seems to have difficulty in comprehension of simple instruction.  - RPR     Follow-up: 2 weeks for vaccinations.  [name] PA

## 2017-03-09 ENCOUNTER — Encounter: Payer: Self-pay | Admitting: Infectious Diseases

## 2017-03-09 LAB — CBC WITH DIFFERENTIAL/PLATELET
BASOS: 0 %
Basophils Absolute: 0 10*3/uL (ref 0.0–0.2)
EOS (ABSOLUTE): 0.2 10*3/uL (ref 0.0–0.4)
EOS: 6 %
HEMATOCRIT: 35.1 % — AB (ref 37.5–51.0)
Hemoglobin: 11.3 g/dL — ABNORMAL LOW (ref 13.0–17.7)
IMMATURE GRANULOCYTES: 1 %
Immature Grans (Abs): 0 10*3/uL (ref 0.0–0.1)
Lymphocytes Absolute: 0.6 10*3/uL — ABNORMAL LOW (ref 0.7–3.1)
Lymphs: 16 %
MCH: 29.7 pg (ref 26.6–33.0)
MCHC: 32.2 g/dL (ref 31.5–35.7)
MCV: 92 fL (ref 79–97)
MONOS ABS: 0.3 10*3/uL (ref 0.1–0.9)
Monocytes: 9 %
NEUTROS ABS: 2.4 10*3/uL (ref 1.4–7.0)
NEUTROS PCT: 68 %
PLATELETS: 235 10*3/uL (ref 150–379)
RBC: 3.8 x10E6/uL — ABNORMAL LOW (ref 4.14–5.80)
RDW: 13.3 % (ref 12.3–15.4)
WBC: 3.5 10*3/uL (ref 3.4–10.8)

## 2017-03-09 LAB — HEPATITIS C ANTIBODY (REFLEX): HCV Ab: <0.1 s/co ratio (ref 0.0–0.9)

## 2017-03-09 LAB — HIV 1/2 AB DIFFERENTIATION
HIV 1 AB: POSITIVE — AB
HIV 2 Ab: NEGATIVE
NOTE (HIV CONF MULTISPOT): POSITIVE

## 2017-03-09 LAB — THYROID PANEL WITH TSH
Free Thyroxine Index: 1.5 (ref 1.2–4.9)
T3 UPTAKE RATIO: 20 % — AB (ref 24–39)
T4 TOTAL: 7.7 ug/dL (ref 4.5–12.0)
TSH: 2.98 u[IU]/mL (ref 0.450–4.500)

## 2017-03-09 LAB — PSA: PROSTATE SPECIFIC AG, SERUM: 0.7 ng/mL (ref 0.0–4.0)

## 2017-03-09 LAB — HIV ANTIBODY (ROUTINE TESTING W REFLEX): HIV SCREEN 4TH GENERATION: REACTIVE — AB

## 2017-03-09 LAB — RPR: RPR Ser Ql: NONREACTIVE

## 2017-03-09 LAB — HCV COMMENT:

## 2017-03-11 LAB — FECAL OCCULT BLOOD, IMMUNOCHEMICAL: FECAL OCCULT BLD: NEGATIVE

## 2017-04-06 ENCOUNTER — Other Ambulatory Visit: Payer: Self-pay

## 2017-04-06 ENCOUNTER — Ambulatory Visit: Payer: Medicare Other

## 2017-04-08 LAB — T-HELPER CELL (CD4) - (RCID CLINIC ONLY)
CD4 T CELL HELPER: 8 % — AB (ref 33–55)
CD4 T Cell Abs: 70 /uL — ABNORMAL LOW (ref 400–2700)

## 2017-04-08 LAB — QUANTIFERON-TB GOLD PLUS
MITOGEN-NIL: 1.09 [IU]/mL
NIL: 0.04 [IU]/mL
QUANTIFERON-TB GOLD PLUS: NEGATIVE
TB1-NIL: 0.02 [IU]/mL
TB2-NIL: 0.04 [IU]/mL

## 2017-04-11 LAB — HEPATITIS B SURFACE ANTIBODY,QUALITATIVE: Hep B S Ab: NONREACTIVE

## 2017-04-11 LAB — COMPLETE METABOLIC PANEL WITH GFR
AG RATIO: 1.1 (calc) (ref 1.0–2.5)
ALBUMIN MSPROF: 3.9 g/dL (ref 3.6–5.1)
ALT: 19 U/L (ref 9–46)
AST: 19 U/L (ref 10–35)
Alkaline phosphatase (APISO): 81 U/L (ref 40–115)
BUN: 13 mg/dL (ref 7–25)
CALCIUM: 9.4 mg/dL (ref 8.6–10.3)
CO2: 28 mmol/L (ref 20–32)
Chloride: 104 mmol/L (ref 98–110)
Creat: 0.78 mg/dL (ref 0.70–1.25)
GFR, EST AFRICAN AMERICAN: 109 mL/min/{1.73_m2} (ref >=60)
GFR, EST NON AFRICAN AMERICAN: 94 mL/min/{1.73_m2} (ref >=60)
GLOBULIN: 3.5 g/dL (ref 1.9–3.7)
Glucose, Bld: 106 mg/dL — ABNORMAL HIGH (ref 65–99)
POTASSIUM: 4 mmol/L (ref 3.5–5.3)
SODIUM: 140 mmol/L (ref 135–146)
Total Bilirubin: 0.5 mg/dL (ref 0.2–1.2)
Total Protein: 7.4 g/dL (ref 6.1–8.1)

## 2017-04-11 LAB — LIPID PANEL
CHOL/HDL RATIO: 5.2 (calc) — AB (ref <5.0)
CHOLESTEROL: 262 mg/dL — AB (ref <200)
HDL: 50 mg/dL (ref >40)
LDL CHOLESTEROL (CALC): 183 mg/dL — AB
Non-HDL Cholesterol (Calc): 212 mg/dL (calc) — ABNORMAL HIGH (ref <130)
Triglycerides: 145 mg/dL (ref <150)

## 2017-04-11 LAB — CBC WITH DIFFERENTIAL/PLATELET
BASOS PCT: 0.6 %
Basophils Absolute: 21 cells/uL (ref 0–200)
EOS PCT: 10.1 %
Eosinophils Absolute: 354 cells/uL (ref 15–500)
HCT: 37.5 % — ABNORMAL LOW (ref 38.5–50.0)
HEMOGLOBIN: 12.5 g/dL — AB (ref 13.2–17.1)
LYMPHS ABS: 952 {cells}/uL (ref 850–3900)
MCH: 29.9 pg (ref 27.0–33.0)
MCHC: 33.3 g/dL (ref 32.0–36.0)
MCV: 89.7 fL (ref 80.0–100.0)
MONOS PCT: 9 %
MPV: 10.9 fL (ref 7.5–12.5)
NEUTROS ABS: 1859 {cells}/uL (ref 1500–7800)
Neutrophils Relative %: 53.1 %
Platelets: 181 10*3/uL (ref 140–400)
RBC: 4.18 10*6/uL — ABNORMAL LOW (ref 4.20–5.80)
RDW: 14.3 % (ref 11.0–15.0)
Total Lymphocyte: 27.2 %
WBC mixed population: 315 cells/uL (ref 200–950)
WBC: 3.5 10*3/uL — ABNORMAL LOW (ref 3.8–10.8)

## 2017-04-11 LAB — HEPATITIS B SURFACE ANTIGEN: HEP B S AG: NONREACTIVE

## 2017-04-11 LAB — RPR: RPR Ser Ql: NONREACTIVE

## 2017-04-11 LAB — HEPATITIS C ANTIBODY
HEP C AB: NONREACTIVE
SIGNAL TO CUT-OFF: 0.17 (ref <1.00)

## 2017-04-11 LAB — HEPATITIS B CORE ANTIBODY, TOTAL: Hep B Core Total Ab: NONREACTIVE

## 2017-04-11 LAB — HLA B*5701: HLA-B*5701 w/rflx HLA-B High: NEGATIVE

## 2017-04-11 LAB — HEPATITIS A ANTIBODY, TOTAL: Hepatitis A AB,Total: NONREACTIVE

## 2017-04-14 LAB — HIV-1 RNA ULTRAQUANT REFLEX TO GENTYP+
HIV 1 RNA QUANT: 1210000 {copies}/mL — AB
HIV-1 RNA Quant, Log: 6.08 Log cps/mL — ABNORMAL HIGH

## 2017-04-14 LAB — RFLX HIV-1 INTEGRASE GENOTYPE: HIV-1 GENOTYPE: NOT DETECTED

## 2017-04-21 ENCOUNTER — Ambulatory Visit (INDEPENDENT_AMBULATORY_CARE_PROVIDER_SITE_OTHER): Payer: Self-pay | Admitting: Licensed Clinical Social Worker

## 2017-04-21 ENCOUNTER — Encounter: Payer: Self-pay | Admitting: Infectious Diseases

## 2017-04-21 ENCOUNTER — Ambulatory Visit (INDEPENDENT_AMBULATORY_CARE_PROVIDER_SITE_OTHER): Payer: Self-pay | Admitting: Infectious Diseases

## 2017-04-21 ENCOUNTER — Ambulatory Visit: Payer: Medicare Other | Admitting: Pharmacist Clinician (PhC)/ Clinical Pharmacy Specialist

## 2017-04-21 DIAGNOSIS — Z0000 Encounter for general adult medical examination without abnormal findings: Secondary | ICD-10-CM

## 2017-04-21 DIAGNOSIS — E782 Mixed hyperlipidemia: Secondary | ICD-10-CM

## 2017-04-21 DIAGNOSIS — B20 Human immunodeficiency virus [HIV] disease: Secondary | ICD-10-CM

## 2017-04-21 DIAGNOSIS — Z205 Contact with and (suspected) exposure to viral hepatitis: Secondary | ICD-10-CM | POA: Insufficient documentation

## 2017-04-21 DIAGNOSIS — E785 Hyperlipidemia, unspecified: Secondary | ICD-10-CM | POA: Insufficient documentation

## 2017-04-21 DIAGNOSIS — G4700 Insomnia, unspecified: Secondary | ICD-10-CM

## 2017-04-21 MED ORDER — DARUN-COBIC-EMTRICIT-TENOFAF 800-150-200-10 MG PO TABS: 1.0000 | ORAL_TABLET | Freq: Every day | ORAL | 0 refills | Status: DC

## 2017-04-21 MED ORDER — SULFAMETHOXAZOLE-TRIMETHOPRIM 800-160 MG PO TABS: 1.0000 | ORAL_TABLET | Freq: Every day | ORAL | 3 refills | Status: DC

## 2017-04-21 MED ORDER — SULFAMETHOXAZOLE-TRIMETHOPRIM 400-80 MG PO TABS: 1.0000 | ORAL_TABLET | Freq: Every day | ORAL | 5 refills | Status: DC

## 2017-04-21 MED ORDER — DARUN-COBIC-EMTRICIT-TENOFAF 800-150-200-10 MG PO TABS: 1.0000 | ORAL_TABLET | Freq: Every day | ORAL | 3 refills | Status: DC

## 2017-04-21 MED FILL — SYMTUZA 800-150-200-10 MG T: 800-150-200 | 30 days supply | Qty: 30 | Fill #0

## 2017-04-21 MED FILL — SULFAMETHOXAZOLE-TMP DS TAB: 800-160 | 30 days supply | Qty: 30 | Fill #0

## 2017-04-21 NOTE — Patient Instructions (Signed)
We are going to start you on 2 medications today.   Symtuza and Bactrim - you will need to take both of these medications once a day with a full meal.   Please come back in 4 weeks to check in with [place] and see how you are doing. We will recheck labs again at this visit.

## 2017-04-21 NOTE — Assessment & Plan Note (Addendum)
Hold off on recommended vaccines until CD4 reconstitutes. Needs pneumovax, prevnar, hep b. He is newly established with a PCP

## 2017-04-21 NOTE — Assessment & Plan Note (Signed)
His partner is currently undergoing treatment for HCV and they have had unprotected sex. Will re-screen HCV Ab again at future visit.

## 2017-04-21 NOTE — Assessment & Plan Note (Addendum)
New patient here to establish for HIV care. I discussed with Stevie Kern treatment options/side effects, benefits of treatment and long-term outcomes. I discussed how HIV is transmitted and the process of untreated HIV including increased risk for opportunistic infections, cancer, dementia and renal failure. He was counseled on routine HIV care including medication adherence, blood monitoring, necessary vaccines and follow up visits. Counseled regarding safe sex practices including: condom use, partner disclosure, limiting partners. He spent time talking with our pharmacist Minh regarding successful practices of ART and understands to reach out to our clinic in the future with questions. He needs to apply for Medicare Part D ASAP. We can do 30 days of medications with med assist card in the interim.   CD4 count 70 - start Bactrim daily for OI prophylaxis.   His partner's regimen currently Odefsey + Tivicay - will start [place] on [place] today as I fear he has high risk for transmitted resistance. He will return in 4 weeks after starting medications and reassess.

## 2017-04-21 NOTE — Progress Notes (Signed)
Juan Johnston  Aug 30, 1950 [id number] Juan Demark, PA-C   Reason for Visit: Routine HIV Care - Establish Care   Patient Active Problem List   Diagnosis Date Noted  . AIDS (acquired immune deficiency syndrome) (Juan Johnston) 04/21/2017  . Exposure to hepatitis C 04/21/2017  . Healthcare maintenance 04/21/2017  . Hyperlipidemia 04/21/2017    Patient's Medications  New Prescriptions   DARUNAVIR-COBICISCTAT-EMTRICITABINE-TENOFOVIR ALAFENAMIDE (SYMTUZA) 800-150-200-10 MG TABS    Take 1 tablet by mouth daily with breakfast.   SULFAMETHOXAZOLE-TRIMETHOPRIM (BACTRIM) 400-80 MG TABLET    Take 1 tablet by mouth daily.  Previous Medications   PANTOPRAZOLE (PROTONIX) 40 MG TABLET    Take 1 tablet (40 mg total) by mouth 2 (two) times daily.  Modified Medications   No medications on file  Discontinued Medications   No medications on file    HPI:  Juan Johnston is a 67 y.o. AA male with newly diagnosed HIV infection. He is here with his husband of 18 years whom is also HIV positive. He is not surprised at this information. He has no questions surrounding his lab work but is curious to know where his numbers fall. Reports no complaints today suggestive of associated opportunistic infection or advancing HIV disease such as fevers, night sweats, weight loss, anorexia, cough, SOB, nausea, vomiting, diarrhea, headache, sensory changes, lymphadenopathy or oral thrush. He does report a rash to his bilateral lower extremities that he is concerned about. It is flat, non painful, non-pruritic.   He is newly established with a PCP. Esophagitis noted on recent barium swallow however he reports no pain and is not taking protonix as of now.   Review of Systems: Review of Systems  Constitutional: Negative for chills, fever, malaise/fatigue and weight loss.  HENT: Negative for sore throat.        No dental problems  Respiratory: Negative for cough and sputum production.   Cardiovascular: Negative for chest pain and  leg swelling.  Gastrointestinal: Negative for abdominal pain, diarrhea and vomiting.  Genitourinary: Negative for dysuria and flank pain.  Musculoskeletal: Negative for joint pain, myalgias and neck pain.  Skin: Positive for rash.  Neurological: Negative for dizziness, tingling and headaches.  Psychiatric/Behavioral: Negative for depression and substance abuse. The patient is not nervous/anxious and does not have insomnia.     Past Medical History:  Diagnosis Date  . HIV infection (Juan Johnston)     No Known Allergies  Social History   Tobacco Use  . Smoking status: Former Smoker    Packs/day: 0.50    Types: Cigarettes    Last attempt to quit: 02/03/2017    Years since quitting: 0.2  . Smokeless tobacco: Never Used  Substance Use Topics  . Alcohol use: Yes  . Drug use: Not on file    Family History  Problem Relation Age of Onset  . Hypertension Mother     Social History   Substance and Sexual Activity  Sexual Activity Not on file    Physical Exam and Objective Findings:  Vitals:   04/21/17 1402  BP: (!) 147/86  Pulse: 73  Temp: (!) 97.5 F (36.4 C)  TempSrc: Oral  Weight: 147 lb (66.7 kg)   Body mass index is 23.02 kg/m.  Physical Exam  Constitutional: He is oriented to person, place, and time and well-developed, well-nourished, and in no distress.  Thin appearing AA male.   HENT:  Mouth/Throat: No oral lesions. Normal dentition. No dental caries.  Eyes: No scleral icterus.  Cardiovascular: Normal rate, regular rhythm  and normal heart sounds.  Pulmonary/Chest: Effort normal and breath sounds normal.  Abdominal: Soft. He exhibits no distension. There is no tenderness.  Lymphadenopathy:    He has no cervical adenopathy.  Neurological: He is alert and oriented to person, place, and time.  Skin: Skin is warm and dry. No rash noted.  Psychiatric: Mood and affect normal.  Vitals reviewed.   Lab Results Lab Results  Component Value Date   WBC 3.5 (L)  04/06/2017   HGB 12.5 (L) 04/06/2017   HCT 37.5 (L) 04/06/2017   MCV 89.7 04/06/2017   PLT 181 04/06/2017    Lab Results  Component Value Date   CREATININE 0.78 04/06/2017   BUN 13 04/06/2017   NA 140 04/06/2017   K 4.0 04/06/2017   CL 104 04/06/2017   CO2 28 04/06/2017    Lab Results  Component Value Date   ALT 19 04/06/2017   AST 19 04/06/2017   ALKPHOS 91 02/06/2017   BILITOT 0.5 04/06/2017    Lab Results  Component Value Date   CHOL 262 (H) 04/06/2017   HDL 50 04/06/2017   TRIG 145 04/06/2017   CHOLHDL 5.2 (H) 04/06/2017   HIV 1 RNA Quant (Copies/mL)  Date Value  04/06/2017 1,210,000 (H)   CD4 T Cell Abs (/uL)  Date Value  04/06/2017 70 (L)   No results found for: HAV Lab Results  Component Value Date   HEPBSAG NON-REACTIVE 04/06/2017   HEPBSAB NON-REACTIVE 04/06/2017   No results found for: HCVAB No results found for: CHLAMYDIAWP, N No results found for: GCPROBEAPT No results found for: QUANTGOLD No results found for: RPR    Problem List Items Addressed This Visit      Other   AIDS (acquired immune deficiency syndrome) (Juan Johnston)    New patient here to establish for HIV care. I discussed with Stevie Kern treatment options/side effects, benefits of treatment and long-term outcomes. I discussed how HIV is transmitted and the process of untreated HIV including increased risk for opportunistic infections, cancer, dementia and renal failure. He was counseled on routine HIV care including medication adherence, blood monitoring, necessary vaccines and follow up visits. Counseled regarding safe sex practices including: condom use, partner disclosure, limiting partners. He spent time talking with our pharmacist Minh regarding successful practices of ART and understands to reach out to our clinic in the future with questions. He needs to apply for Medicare Part D ASAP. We can do 30 days of medications with med assist card in the interim.   CD4 count 70 - start  Bactrim daily for OI prophylaxis.   His partner's regimen currently Odefsey + Tivicay - will start [place] on [place] today as I fear he has high risk for transmitted resistance. He will return in 4 weeks after starting medications and reassess.         Relevant Orders   HIV-1 RNA ultraquant reflex to gentyp+   Exposure to hepatitis C    His partner is currently undergoing treatment for HCV and they have had unprotected sex. Will re-screen HCV Ab again at future visit.       Healthcare maintenance    Hold off on recommended vaccines until CD4 reconstitutes. Needs pneumovax, prevnar, hep b. He is newly established with a PCP       Hyperlipidemia    He will need to start on statin therapy. Will address this at upcoming visit.         I spent greater than 45 minutes with  the patient today. Greater than 50% of the time spent face-to-face counseling and coordination of care re: HIV and health maintenance.   Janene Madeira, MSN, NP-C [facility] [place] Pager: [phone]  04/21/2017  4:25 PM

## 2017-04-21 NOTE — Progress Notes (Signed)
HPI: Juan Johnston is a 67 y.o. male who is here for his initial visit with Colletta [place] for his HIV  Allergies: No Known Allergies  Vitals: Temp: 97.5 F (36.4 C) (01/17 1402) Temp Source: Oral (01/17 1402) BP: 147/86 (01/17 1402) Pulse Rate: 73 (01/17 1402)  Past Medical History: Past Medical History:  Diagnosis Date  . HIV infection [facility])     Social History: Social History   Socioeconomic History  . Marital status: Single    Spouse name: Not on file  . Number of children: Not on file  . Years of education: Not on file  . Highest education level: Not on file  Social Needs  . Financial resource strain: Not on file  . Food insecurity - worry: Not on file  . Food insecurity - inability: Not on file  . Transportation needs - medical: Not on file  . Transportation needs - non-medical: Not on file  Occupational History  . Not on file  Tobacco Use  . Smoking status: Former Smoker    Packs/day: 0.50    Types: Cigarettes    Last attempt to quit: 02/03/2017    Years since quitting: 0.2  . Smokeless tobacco: Never Used  Substance and Sexual Activity  . Alcohol use: Yes  . Drug use: Not on file  . Sexual activity: Not on file  Other Topics Concern  . Not on file  Social History Narrative  . Not on file    Previous Regimen: None  Current Regimen: None  Labs: HIV 1 RNA Quant (Copies/mL)  Date Value  04/06/2017 1,210,000 (H)   CD4 T Cell Abs (/uL)  Date Value  04/06/2017 70 (L)   Hep B S Ab (no units)  Date Value  04/06/2017 NON-REACTIVE   Hepatitis B Surface Ag (no units)  Date Value  04/06/2017 NON-REACTIVE    CrCl: Estimated Creatinine Clearance: 84.9 mL/min (by C-G formula based on SCr of 0.78 mg/dL).  Lipids:    Component Value Date/Time   CHOL 262 (H) 04/06/2017 1708   TRIG 145 04/06/2017 1708   HDL 50 04/06/2017 1708   CHOLHDL 5.2 (H) 04/06/2017 1708    Assessment: Juan Johnston is here with his partner for his initial HIV visit with our NP.  He has gotten all labs done, however, they couldn't do the genotype for some reason. They were able to do the integrase one. Juan Johnston called them but they didn't have a very good reason of why that is. Therefore, we will repeat it today.   His partner is on [place] and Tivicay, therefore, we are going to use Symtuza for him. Counseled him extensively on adherence to avoid resistance. We were planning to start him on meds today through the voucher card then [facility] until his ADAP is approved. We quickly realized that he will not be qualified through ADAP because he has Medicare Part A. We told him to set up an appt with [facility] to sign up for Medicare Part D. Gave him the address and phone number to [facility] in [place].   Recommendations:  Start Symtuza 1 PO qday today through voucher ADAP submitted on 1/4 Sign up for Medicare Part D Septra SS 1 PO qday at [facility] F/u in 4 wks with Jeanann Lewandowsky, PharmD, BCPS, AAHIVP, CPP Clinical Infectious [place] [facility] 04/21/2017, 4:18 PM

## 2017-04-21 NOTE — Assessment & Plan Note (Signed)
He will need to start on statin therapy. Will address this at upcoming visit.

## 2017-04-25 NOTE — BH Specialist Note (Signed)
Integrated Behavioral Health Initial Visit  MRN: [id number] Name: Juan Johnston  Number of [place] Clinician visits:: 1/6 Session Start time: 3:05 pm  Session End time: 3:15 pm Total time: 10 mins  Type of Service: [place] Interpretor:No. Interpretor Name and Language: N/A   Warm Hand Off Completed.       SUBJECTIVE: Juan Johnston is a 67 y.o. male accompanied by Partner/Significant Other Patient was referred by Janene Madeira for being a new HIV referral.  Patient reports the following symptoms/concerns: Patient reported that he was diagnosed with HIV 3-4 weeks ago in December and has informed his partner and sister, who has been supportive. Patient denied experiencing difficulty with adjusting to his new diagnosis.  Patient reported having good social support and stated that he has friends to talk to when he is stressed.  Patient denied having any current life stressors.  Patient reported sleeping in blocks of 5 hours, then waking up and sleeping for an additional 2-3 hours.  Patient denied being in distress or feeling daytime fatigue and denied need for sleep hygiene resources.  Patient denied need for resources or referrals and denied need for follow up appointment with [facility] and was not willing to schedule.  **Patient did not answer all questions immediately.  His partner provided responses to multiple questions.  Duration of problem: December 2018; Severity of problem: mild  OBJECTIVE: Mood: Euthymic and Affect: Blunt Risk of harm to self or others: No plan to harm self or others  LIFE CONTEXT: Family and Social: Patient lives with his partner. School/Work: Patient does not work. Self-Care: Patient is able to tend to his ADL's. Life Changes: Patient is newly diagnosed with HIV.  ASSESSMENT: Patient is newly diagnosed with HIV and presents as well adjusted.  Patient may benefit from continued medication  education to increase medication and treatment compliance.  Patient is also experiencing sleep issues and may benefit from sleep hygiene.  GOALS ADDRESSED: Patient will: 1. Reduce symptoms of: insomnia 2. Increase knowledge and/or ability of: coping skills and healthy habits  3. Demonstrate ability to: Improve quality and quantity of sleep  INTERVENTIONS: Interventions utilized: Supportive Counseling and Sleep Hygiene   PLAN: 1. Patient will call [facility] if additional assistance or resources are desired.  Sande Rives, [facility]

## 2017-05-03 LAB — HIV-1 RNA ULTRAQUANT REFLEX TO GENTYP+
HIV 1 RNA QUANT: 348000 {copies}/mL — AB
HIV-1 RNA QUANT, LOG: 5.54 {Log_copies}/mL — AB

## 2017-05-03 LAB — RFLX HIV-1 INTEGRASE GENOTYPE: HIV-1 Genotype: DETECTED — AB

## 2017-05-09 ENCOUNTER — Encounter: Payer: Self-pay | Admitting: Behavioral Health

## 2017-05-09 ENCOUNTER — Other Ambulatory Visit: Payer: Self-pay | Admitting: Pharmacist Clinician (PhC)/ Clinical Pharmacy Specialist

## 2017-05-09 MED ORDER — DARUNAVIR-COBICISTAT 800-150 MG PO TABS: 1.0000 | ORAL_TABLET | Freq: Every day | ORAL | 5 refills | Status: DC

## 2017-05-09 MED ORDER — EMTRICITABINE-TENOFOVIR AF 200-25 MG PO TABS: 1.0000 | ORAL_TABLET | Freq: Every day | ORAL | 5 refills | Status: DC

## 2017-05-09 NOTE — Progress Notes (Addendum)
ADAP is now  Approved so we will send Prezcobix and Descovy to [facility]. Told him to finish out Symtuza then start the new regimen. He was still confused so I made him a calendar.

## 2017-05-11 ENCOUNTER — Encounter: Payer: Self-pay | Admitting: Infectious Diseases

## 2017-05-19 ENCOUNTER — Encounter: Payer: Self-pay | Admitting: Infectious Diseases

## 2017-05-19 ENCOUNTER — Other Ambulatory Visit: Payer: Self-pay

## 2017-05-19 ENCOUNTER — Ambulatory Visit (INDEPENDENT_AMBULATORY_CARE_PROVIDER_SITE_OTHER): Payer: Medicare Other | Admitting: Infectious Diseases

## 2017-05-19 VITALS — BP 122/77 | HR 86 | Temp 97.6°F | Ht 68.0 in | Wt 149.0 lb

## 2017-05-19 DIAGNOSIS — B20 Human immunodeficiency virus [HIV] disease: Secondary | ICD-10-CM

## 2017-05-19 DIAGNOSIS — L731 Pseudofolliculitis barbae: Secondary | ICD-10-CM

## 2017-05-19 DIAGNOSIS — L738 Other specified follicular disorders: Secondary | ICD-10-CM

## 2017-05-19 DIAGNOSIS — Z23 Encounter for immunization: Secondary | ICD-10-CM

## 2017-05-19 DIAGNOSIS — D7218 Eosinophilia in diseases classified elsewhere: Secondary | ICD-10-CM | POA: Insufficient documentation

## 2017-05-19 DIAGNOSIS — E049 Nontoxic goiter, unspecified: Secondary | ICD-10-CM

## 2017-05-19 MED ORDER — HYDROXYZINE HCL 25 MG PO TABS: 25.0000 mg | ORAL_TABLET | Freq: Three times a day (TID) | ORAL | 0 refills | Status: DC | PRN

## 2017-05-19 NOTE — Patient Instructions (Signed)
Continue taking your Prezcobix and Descovy every day with food.   Continue taking the Bactrim every day.   I am hopeful that your rash and itching will resolve once we get your immune system working properly - this is something that we see very commonly with people with low CD4 counts that start on medications.   We will check your labs today and see you back in 6 weeks to review. We will call you with anything abnormal should we need to.   Happy Valentine's Day!

## 2017-05-19 NOTE — Progress Notes (Signed)
Name:Juan Johnston  DOB: Nov 01, 1950  MRN: [id number]  PCP: [name], PA-C   Chief Complaint  Patient presents with  . Rash    noticed last week, itchy, chest and back   Patient Active Problem List   Diagnosis Date Noted  . 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 Thyroid goiter 05/19/2017  . AIDS (acquired immune deficiency syndrome) ([place]) 04/21/2017  . Exposure to hepatitis C 04/21/2017  . Healthcare maintenance 04/21/2017  . Hyperlipidemia 04/21/2017    Subjective:  Brief Narrative:  Juan Johnston is a 67 y.o. AA male with HIV infection diagnosed 03-2017. His CD4 nadir was 70 and was with AIDS defining illness (esophageal candidiasis/oral thrush). HIV Risk: MSM / HIV+ partner. OI Hx: esophageal candidiasis.   Genotype:  04-2017: K103N, E138G (R-nevirapine, efavirenz; I-rilpivirine)  HPI:  Juan Johnston is here for follow up with his partner today for HIV. He has done well taking his Prezcobix + Descovy every day. He reports no issues or concerning side effects. Although he is still somewhat fatigued and needs breaks throughout the day he does have some increased energy and has put on a few pounds. Overall feeling better than our last visit. No dysphagia. He has only 2 doses of carafate left then will start on his PPI.   Only complaint is a new rash today that started about 1 week ago. It is mildly pruritic but noticibly more so at night. It is localized to the sides of his neck, upper chest and upper back. No evidence of progression of this on extremities, legs or torso. He has taken nothing for the symptoms.   Review of Systems  Constitutional: Positive for malaise/fatigue. Negative for chills, fever and weight loss.  HENT: Negative for sore throat.        No dental problems  Respiratory: Negative for cough and sputum production.   Cardiovascular: Negative for chest pain and leg swelling.  Gastrointestinal: Negative for abdominal pain, diarrhea and vomiting.  Genitourinary:  Negative for dysuria and flank pain.  Musculoskeletal: Negative for joint pain, myalgias and neck pain.  Skin: Positive for itching and rash.  Neurological: Negative for dizziness, tingling and headaches.  Psychiatric/Behavioral: Negative for depression and substance abuse. The patient is not nervous/anxious and does not have insomnia.     Past Medical History:  Diagnosis Date  . HIV infection ([place])   . Thyroid goiter    Outpatient Medications Prior to Visit  Medication Sig Dispense Refill  . darunavir-cobicistat (PREZCOBIX) 800-150 MG tablet Take 1 tablet by mouth daily with breakfast. Swallow whole. Do NOT crush, break or chew tablets. Take with food. 30 tablet 5  . emtricitabine-tenofovir AF (DESCOVY) 200-25 MG tablet Take 1 tablet by mouth daily. 30 tablet 5  . sulfamethoxazole-trimethoprim (BACTRIM DS,SEPTRA DS) 800-160 MG tablet   5  . pantoprazole (PROTONIX) 40 MG tablet Take 1 tablet (40 mg total) by mouth 2 (two) times daily. (Patient not taking: Reported on 04/21/2017) 60 tablet 0  . sucralfate (CARAFATE) 1 g tablet take 1 tablets by mouth four times a day on empty stomach before meals and at bedtime  0  . sulfamethoxazole-trimethoprim (BACTRIM) 400-80 MG tablet Take 1 tablet by mouth daily. (Patient not taking: Reported on 05/19/2017) 30 tablet 5   No facility-administered medications prior to visit.    No Known Allergies  Social History   Tobacco Use  . Smoking status: Former Smoker    Packs/day: 0.50    Types: Cigarettes    Last attempt to quit: 02/03/2017  Years since quitting: 0.2  . Smokeless tobacco: Never Used  Substance Use Topics  . Alcohol use: Yes  . Drug use: No    Family History  Problem Relation Age of Onset  . Hypertension Mother     Social History   Substance and Sexual Activity  Sexual Activity Yes  . Partners: Male  . Birth control/protection: None    Objective:   Vitals:   05/19/17 1344  BP: 122/77  Pulse: 86  Temp: 97.6 F (36.4  C)  TempSrc: Oral  Weight: 149 lb (67.6 kg)  Height: 5\' 8"  (1.727 m)   Body mass index is 22.66 kg/m.   Physical Exam  Constitutional: He is oriented to person, place, and time and well-developed, well-nourished, and in no distress.  HENT:  Mouth/Throat: Oropharynx is clear and moist. No oral lesions. Normal dentition. No dental caries.  Eyes: Pupils are equal, round, and reactive to light. No scleral icterus.  Neck: Thyromegaly (goiter present ) present.  Cardiovascular: Normal rate, regular rhythm and normal heart sounds.  Pulmonary/Chest: Effort normal and breath sounds normal.  Abdominal: Soft. He exhibits no distension. There is no tenderness.  Lymphadenopathy:    He has cervical adenopathy.  Neurological: He is alert and oriented to person, place, and time.  Skin: Skin is warm and dry. No rash noted.  Psychiatric: Mood and affect normal.  Vitals reviewed.   Lab Results Lab Results  Component Value Date   WBC 3.5 (L) 04/06/2017   HGB 12.5 (L) 04/06/2017   HCT 37.5 (L) 04/06/2017   MCV 89.7 04/06/2017   PLT 181 04/06/2017    Lab Results  Component Value Date   CREATININE 0.78 04/06/2017   BUN 13 04/06/2017   NA 140 04/06/2017   K 4.0 04/06/2017   CL 104 04/06/2017   CO2 28 04/06/2017    Lab Results  Component Value Date   ALT 19 04/06/2017   AST 19 04/06/2017   ALKPHOS 91 02/06/2017   BILITOT 0.5 04/06/2017    Lab Results  Component Value Date   CHOL 262 (H) 04/06/2017   HDL 50 04/06/2017   TRIG 145 04/06/2017   CHOLHDL 5.2 (H) 04/06/2017   HIV 1 RNA Quant (Copies/mL)  Date Value  04/21/2017 348,000 (H)  04/06/2017 1,210,000 (H)   CD4 T Cell Abs (/uL)  Date Value  04/06/2017 70 (L)   No results found for: HAV Lab Results  Component Value Date   HEPBSAG NON-REACTIVE 04/06/2017   HEPBSAB NON-REACTIVE 04/06/2017   No results found for: HCVAB No results found for: CHLAMYDIAWP, N No results found for: GCPROBEAPT No results found for:  QUANTGOLD No results found for: RPR    Problem List Items Addressed This Visit      Endocrine   Thyroid goiter    Large goiter present which appears larger to me today than last time. I will check TSH/Free T4 today to ensure proper function.       Relevant Orders   TSH   T4, free     Musculoskeletal and Integument   Eosinophilic folliculitis    I will send in some hydroxyzine for him to try to help him with the nocturnal itching and sleep. I reassured him that this was likely due to his low CD4 count and possible IRIS-mediated response with applying his HIV medications. It is fairly mild and contained to his neck/upper chest/upper back and less likely related to prezcobix. Possible r/t bactrim. I have asked him to call should  this worsen in severity - will reduce Bactrim to 3x/w or change to Dapsone.         Other   AIDS (acquired immune deficiency syndrome) ([place]) - Primary    He has done well on current regimen. I will continue this and the Bactrim for him until our next visit. I will recheck his VL with INSTI genotype (not done prior) and CD4 today. Recheck CMET for medication monitoring. He has regained 9 lbs and feeling better. Adherence counseling provided today. He will follow up again in 6 weeks with me considering his AIDS status.       Relevant Orders   HIV RNA, RTPCR W/R GT (RTI, PI,INT)   T-helper cell (CD4)- ([facility] clinic only)   COMPLETE METABOLIC PANEL WITH GFR    Other Visit Diagnoses    Need for immunization against influenza       Relevant Orders   Flu Vaccine QUAD 36+ mos IM (Completed)     Health Maintenance = flu vaccine today. I would like to hold on the pneumovax until CD4 > [place], MSN, NP-C [facility] [place] Pager: [phone]  05/19/2017  2:29 PM

## 2017-05-19 NOTE — Assessment & Plan Note (Signed)
I will send in some hydroxyzine for him to try to help him with the nocturnal itching and sleep. I reassured him that this was likely due to his low CD4 count and possible IRIS-mediated response with applying his HIV medications. It is fairly mild and contained to his neck/upper chest/upper back and less likely related to prezcobix. Possible r/t bactrim. I have asked him to call should this worsen in severity - will reduce Bactrim to 3x/w or change to Dapsone.

## 2017-05-19 NOTE — Assessment & Plan Note (Signed)
Large goiter present which appears larger to me today than last time. I will check TSH/Free T4 today to ensure proper function.

## 2017-05-19 NOTE — Assessment & Plan Note (Signed)
He has done well on current regimen. I will continue this and the Bactrim for him until our next visit. I will recheck his VL with INSTI genotype (not done prior) and CD4 today. Recheck CMET for medication monitoring. He has regained 9 lbs and feeling better. Adherence counseling provided today. He will follow up again in 6 weeks with me considering his AIDS status.

## 2017-05-20 LAB — COMPLETE METABOLIC PANEL WITH GFR
AG RATIO: 1.3 (calc) (ref 1.0–2.5)
ALBUMIN MSPROF: 4.3 g/dL (ref 3.6–5.1)
ALT: 22 U/L (ref 9–46)
AST: 16 U/L (ref 10–35)
Alkaline phosphatase (APISO): 63 U/L (ref 40–115)
BUN: 18 mg/dL (ref 7–25)
CO2: 26 mmol/L (ref 20–32)
CREATININE: 1.02 mg/dL (ref 0.70–1.25)
Calcium: 9.8 mg/dL (ref 8.6–10.3)
Chloride: 101 mmol/L (ref 98–110)
GFR, EST AFRICAN AMERICAN: 88 mL/min/{1.73_m2} (ref >=60)
GFR, EST NON AFRICAN AMERICAN: 76 mL/min/{1.73_m2} (ref >=60)
GLOBULIN: 3.4 g/dL (ref 1.9–3.7)
Glucose, Bld: 111 mg/dL — ABNORMAL HIGH (ref 65–99)
POTASSIUM: 4.3 mmol/L (ref 3.5–5.3)
SODIUM: 134 mmol/L — AB (ref 135–146)
TOTAL PROTEIN: 7.7 g/dL (ref 6.1–8.1)
Total Bilirubin: 0.5 mg/dL (ref 0.2–1.2)

## 2017-05-20 LAB — T-HELPER CELL (CD4) - (RCID CLINIC ONLY)
CD4 % Helper T Cell: 8 % — ABNORMAL LOW (ref 33–55)
CD4 T Cell Abs: 100 /uL — ABNORMAL LOW (ref 400–2700)

## 2017-05-20 LAB — TSH: TSH: 1.79 mIU/L (ref 0.40–4.50)

## 2017-05-20 LAB — T4, FREE: Free T4: 1.1 ng/dL (ref 0.8–1.8)

## 2017-06-04 LAB — HIV-1 INTEGRASE GENOTYPE

## 2017-06-04 LAB — HIV RNA, RTPCR W/R GT (RTI, PI,INT)
HIV 1 RNA Quant: 497 copies/mL — ABNORMAL HIGH
HIV-1 RNA QUANT, LOG: 2.7 {Log_copies}/mL — AB

## 2017-06-04 LAB — HIV-1 GENOTYPE: HIV-1 GENOTYPE: DETECTED — AB

## 2017-06-15 ENCOUNTER — Ambulatory Visit (HOSPITAL_COMMUNITY)
Admission: EM | Admit: 2017-06-15 | Discharge: 2017-06-15 | Disposition: A | Discharge status: Home / Self Care | Payer: No Typology Code available for payment source | Attending: Family Medicine | Admitting: Family Medicine

## 2017-06-15 ENCOUNTER — Ambulatory Visit (INDEPENDENT_AMBULATORY_CARE_PROVIDER_SITE_OTHER): Payer: Self-pay | Admitting: Physician Assistant

## 2017-06-15 ENCOUNTER — Encounter (INDEPENDENT_AMBULATORY_CARE_PROVIDER_SITE_OTHER): Payer: Self-pay | Admitting: Physician Assistant

## 2017-06-15 ENCOUNTER — Encounter (HOSPITAL_COMMUNITY): Payer: Self-pay | Admitting: Emergency Medicine

## 2017-06-15 ENCOUNTER — Other Ambulatory Visit: Payer: Self-pay

## 2017-06-15 VITALS — BP 137/87 | HR 96 | Temp 97.6°F | Wt 158.0 lb

## 2017-06-15 DIAGNOSIS — N492 Inflammatory disorders of scrotum: Secondary | ICD-10-CM

## 2017-06-15 DIAGNOSIS — L0291 Cutaneous abscess, unspecified: Secondary | ICD-10-CM

## 2017-06-15 MED ORDER — CLINDAMYCIN HCL 300 MG PO CAPS: 300.0000 mg | ORAL_CAPSULE | Freq: Four times a day (QID) | ORAL | 0 refills | Status: AC

## 2017-06-15 NOTE — ED Notes (Signed)
Patient in lobby bathroom

## 2017-06-15 NOTE — Progress Notes (Signed)
   Subjective:  Patient ID: Juan Johnston, male    DOB: May 09, 1950  Age: 67 y.o. MRN: [id number]  CC: testicular mass  HPI Juan Johnston is a 67 y.o. male with a medical history of CAP, tobacco use, and oral candidiasis presents with a lesion on left testicle that appeared four days ago. Lesion is red and tender to palpation. No pain in testicle, no penile drainage, no f/c/n/v.      Outpatient Medications Prior to Visit  Medication Sig Dispense Refill  . darunavir-cobicistat (PREZCOBIX) 800-150 MG tablet Take 1 tablet by mouth daily with breakfast. Swallow whole. Do NOT crush, break or chew tablets. Take with food. 30 tablet 5  . emtricitabine-tenofovir AF (DESCOVY) 200-25 MG tablet Take 1 tablet by mouth daily. 30 tablet 5  . hydrOXYzine (ATARAX/VISTARIL) 25 MG tablet Take 1 tablet (25 mg total) by mouth 3 (three) times daily as needed. 60 tablet 0  . pantoprazole (PROTONIX) 40 MG tablet Take 1 tablet (40 mg total) by mouth 2 (two) times daily. (Patient not taking: Reported on 04/21/2017) 60 tablet 0  . sucralfate (CARAFATE) 1 g tablet take 1 tablets by mouth four times a day on empty stomach before meals and at bedtime  0  . sulfamethoxazole-trimethoprim (BACTRIM DS,SEPTRA DS) 800-160 MG tablet   5  . sulfamethoxazole-trimethoprim (BACTRIM) 400-80 MG tablet Take 1 tablet by mouth daily. (Patient not taking: Reported on 05/19/2017) 30 tablet 5   No facility-administered medications prior to visit.      ROS Review of Systems  Constitutional: Negative for chills, fever and malaise/fatigue.  Eyes: Negative for blurred vision.  Respiratory: Negative for shortness of breath.   Cardiovascular: Negative for chest pain and palpitations.  Gastrointestinal: Negative for abdominal pain and nausea.  Genitourinary: Negative for dysuria and hematuria.  Musculoskeletal: Negative for joint pain and myalgias.  Skin: Negative for rash.  Neurological: Negative for tingling and headaches.   Psychiatric/Behavioral: Negative for depression. The patient is not nervous/anxious.     Objective:  BP 137/87 (BP Location: Left Arm, Patient Position: Sitting, Cuff Size: Normal)   Pulse 96   Temp 97.6 F (36.4 C) (Oral)   Wt 158 lb (71.7 kg)   SpO2 100%   BMI 24.02 kg/m   BP/Weight 06/15/2017 05/19/2017 0/99/8338  Systolic BP 250 539 767  Diastolic BP 87 77 86  Wt. (Lbs) 158 149 147  BMI 24.02 22.66 23.02      Physical Exam  Genitourinary:  Genitourinary Comments: Left side of scrotum with a moderately sized abscess. No suppuration or bleeding. No testicular edema or tenderness.     Assessment & Plan:   1. Scrotal abscess - Begin clindamycin (CLEOCIN) 300 MG capsule; Take 1 capsule (300 mg total) by mouth 4 (four) times daily for 7 days.  Dispense: 28 capsule; Refill: 0   Meds ordered this encounter  Medications  . clindamycin (CLEOCIN) 300 MG capsule    Sig: Take 1 capsule (300 mg total) by mouth 4 (four) times daily for 7 days.    Dispense:  28 capsule    Refill:  0    Order Specific Question:   Supervising Provider    Answer:   Tresa Garter [[id number]]    Follow-up: Return in about 1 week (around 06/22/2017) for scrotal abscess.   [name] PA

## 2017-06-15 NOTE — ED Provider Notes (Signed)
[facility]    CSN: [id number] Arrival date & time: 06/15/17  1204     History   Chief Complaint Chief Complaint  Patient presents with  . Abscess    HPI Delrick Dehart is a 67 y.o. male presenting today with abscess.  States that it is under his scrotum.  Has been there for approximately 4-5 days.  Initially thought it was a hair bump, but it continued to grow.  Denies any drainage.  Was sent here by PCP for I&D.  Patient has had small abscesses on his armpits previously, but denies any previous abscesses on his scrotum or genital area.  HPI  Past Medical History:  Diagnosis Date  . HIV infection ([place])   . Thyroid goiter     Patient Active Problem List   Diagnosis Date Noted  . Eosinophilic folliculitis 47/84/1282  . Thyroid goiter 05/19/2017  . AIDS (acquired immune deficiency syndrome) ([place]) 04/21/2017  . Exposure to hepatitis C 04/21/2017  . Healthcare maintenance 04/21/2017  . Hyperlipidemia 04/21/2017    Past Surgical History:  Procedure Laterality Date  . NO PAST SURGERIES         Home Medications    Prior to Admission medications   Medication Sig Start Date End Date Taking? Authorizing Provider  clindamycin (CLEOCIN) 300 MG capsule Take 1 capsule (300 mg total) by mouth 4 (four) times daily for 7 days. 06/15/17 06/22/17 Yes [name], PA-C  darunavir-cobicistat (PREZCOBIX) 800-150 MG tablet Take 1 tablet by mouth daily with breakfast. Swallow whole. Do NOT crush, break or chew tablets. Take with food. 05/09/17  Yes [place] Callas, NP  emtricitabine-tenofovir AF (DESCOVY) 200-25 MG tablet Take 1 tablet by mouth daily. 05/09/17  Yes [place] Callas, NP  hydrOXYzine (ATARAX/VISTARIL) 25 MG tablet Take 1 tablet (25 mg total) by mouth 3 (three) times daily as needed. 05/19/17  Yes [place] Callas, NP  pantoprazole (PROTONIX) 40 MG tablet Take 1 tablet (40 mg total) by mouth 2 (two) times daily. Patient not taking: Reported on 04/21/2017  03/07/17   [name], PA-C  sucralfate (CARAFATE) 1 g tablet take 1 tablets by mouth four times a day on empty stomach before meals and at bedtime 03/11/17   [provider]  sulfamethoxazole-trimethoprim (BACTRIM DS,SEPTRA DS) 800-160 MG tablet  04/21/17   [provider]  sulfamethoxazole-trimethoprim (BACTRIM) 400-80 MG tablet Take 1 tablet by mouth daily. Patient not taking: Reported on 05/19/2017 04/21/17   [place] Callas, NP    Family History Family History  Problem Relation Age of Onset  . Hypertension Mother     Social History Social History   Tobacco Use  . Smoking status: Former Smoker    Packs/day: 0.50    Types: Cigarettes    Last attempt to quit: 02/03/2017    Years since quitting: 0.3  . Smokeless tobacco: Never Used  Substance Use Topics  . Alcohol use: Yes  . Drug use: No     Allergies   Patient has no known allergies.   Review of Systems Review of Systems  Constitutional: Negative for fatigue and fever.  Respiratory: Negative for shortness of breath.   Cardiovascular: Negative for chest pain.  Gastrointestinal: Negative for abdominal pain, nausea and vomiting.  Genitourinary: Positive for scrotal swelling. Negative for difficulty urinating, discharge, dysuria, penile pain and penile swelling.  Skin:       Scrotal abscess  Neurological: Negative for dizziness, light-headedness and headaches.     Physical Exam  Triage Vital Signs ED Triage Vitals [06/15/17 1319]  Enc Vitals Group     BP 116/73     Pulse Rate 78     Resp 18     Temp 98 F (36.7 C)     Temp src      SpO2 100 %     Weight      Height      Head Circumference      Peak Flow      Pain Score      Pain Loc      Pain Edu?      Excl. in [place]?    No data found.  Updated Vital Signs BP 116/73   Pulse 78   Temp 98 F (36.7 C)   Resp 18   SpO2 100%   Visual Acuity Right Eye Distance:   Left Eye Distance:   Bilateral Distance:    Right Eye Near:     Left Eye Near:    Bilateral Near:     Physical Exam  Constitutional: He appears well-developed and well-nourished.  HENT:  Head: Normocephalic and atraumatic.  Eyes: Conjunctivae are normal.  Neck: Neck supple.  Cardiovascular: Normal rate.  Pulmonary/Chest: Effort normal. No respiratory distress.  Musculoskeletal: He exhibits no edema.  Neurological: He is alert.  Skin: Skin is warm and dry.  1 cm area of thickening with erythema to tip on posterior inferior aspect of scrotum  Psychiatric: He has a normal mood and affect.  Nursing note and vitals reviewed.    UC Treatments / Results  Labs (all labs ordered are listed, but only abnormal results are displayed) Labs Reviewed - No data to display  EKG  EKG Interpretation None       Radiology No results found.  Procedures Incision and Drainage Date/Time: 06/15/2017 2:22 PM Performed by: Tristian Sickinger, Elesa Hacker, PA-C Authorized by: Vanessa Kick, MD   Consent:    Consent obtained:  Verbal   Consent given by:  Patient   Risks discussed:  Bleeding and incomplete drainage   Alternatives discussed:  Alternative treatment Location:    Type:  Abscess   Size:  1.5   Location:  Anogenital   Anogenital location:  Scrotal space Anesthesia (see MAR for exact dosages):    Anesthesia method:  Local infiltration   Local anesthetic:  Lidocaine 2% w/o epi Procedure type:    Complexity:  Simple Procedure details:    Needle aspiration: no     Incision types:  Single straight   Scalpel blade:  11   Wound management:  Probed and deloculated   Drainage:  Bloody (thick white discharge)   Drainage amount:  Scant   Wound treatment:  Wound left open   Packing materials:  None Post-procedure details:    Patient tolerance of procedure:  Tolerated well, no immediate complications   (including critical care time)  Medications Ordered in UC Medications - No data to display   Initial Impression / Assessment and Plan / UC Course  I  have reviewed the triage vital signs and the nursing notes.  Pertinent labs & imaging results that were available during my care of the patient were reviewed by me and considered in my medical decision making (see chart for details).     I& D performed.  Small amount of drainage elicited.  Patient given clindamycin by his PCP, will have him follow this, also warm compresses/warm bath soaks with massage. Discussed strict return precautions. Patient verbalized understanding and is agreeable  with plan.   Final Clinical Impressions(s) / UC Diagnoses   Final diagnoses:  Abscess    ED Discharge Orders    None       Controlled Substance Prescriptions [place] Controlled Substance Registry consulted? Not Applicable   Janith Lima, [place] 06/15/17 1424

## 2017-06-15 NOTE — ED Triage Notes (Signed)
Pt c/o an abscess under his scrotum, x4-5 days, pt went to PCP but did not have equipment to have it drained. Sent here for I/D and packing.

## 2017-06-15 NOTE — Discharge Instructions (Signed)
Please fill antibiotic provided by your primary doctor.  Please take as prescribed.  Please perform daily warm baths soaks with massage or warm compresses with massage to encourage further drainage.  Please follow-up if not improving with completion of antibiotics.

## 2017-06-15 NOTE — Patient Instructions (Signed)
Skin Abscess A skin abscess is an infected area on or under your skin that contains pus and other material. An abscess can happen almost anywhere on your body. Some abscesses break open (rupture) on their own. Most continue to get worse unless they are treated. The infection can spread deeper into the body and into your blood, which can make you feel sick. Treatment usually involves draining the abscess. Follow these instructions at home: Abscess Care  If you have an abscess that has not drained, place a warm, clean, wet washcloth over the abscess several times a day. Do this as told by your doctor.  Follow instructions from your doctor about how to take care of your abscess. Make sure you: ? Cover the abscess with a bandage (dressing). ? Change your bandage or gauze as told by your doctor. ? Wash your hands with soap and water before you change the bandage or gauze. If you cannot use soap and water, use hand sanitizer.  Check your abscess every day for signs that the infection is getting worse. Check for: ? More redness, swelling, or pain. ? More fluid or blood. ? Warmth. ? More pus or a bad smell. Medicines   Take over-the-counter and prescription medicines only as told by your doctor.  If you were prescribed an antibiotic medicine, take it as told by your doctor. Do not stop taking the antibiotic even if you start to feel better. General instructions  To avoid spreading the infection: ? Do not share personal care items, towels, or hot tubs with others. ? Avoid making skin-to-skin contact with other people.  Keep all follow-up visits as told by your doctor. This is important. Contact a doctor if:  You have more redness, swelling, or pain around your abscess.  You have more fluid or blood coming from your abscess.  Your abscess feels warm when you touch it.  You have more pus or a bad smell coming from your abscess.  You have a fever.  Your muscles ache.  You have  chills.  You feel sick. Get help right away if:  You have very bad (severe) pain.  You see red streaks on your skin spreading away from the abscess. This information is not intended to replace advice given to you by your health care provider. Make sure you discuss any questions you have with your health care provider. Document Released: 09/08/2007 Document Revised: 11/16/2015 Document Reviewed: 01/29/2015 Elsevier Interactive Patient Education  2018 Elsevier Inc.  

## 2017-06-22 ENCOUNTER — Other Ambulatory Visit: Payer: Self-pay

## 2017-06-22 ENCOUNTER — Ambulatory Visit (INDEPENDENT_AMBULATORY_CARE_PROVIDER_SITE_OTHER): Payer: Self-pay | Admitting: Physician Assistant

## 2017-06-22 ENCOUNTER — Encounter (INDEPENDENT_AMBULATORY_CARE_PROVIDER_SITE_OTHER): Payer: Self-pay | Admitting: Physician Assistant

## 2017-06-22 VITALS — BP 135/87 | HR 82 | Temp 97.6°F | Wt 158.4 lb

## 2017-06-22 DIAGNOSIS — N492 Inflammatory disorders of scrotum: Secondary | ICD-10-CM

## 2017-06-22 NOTE — Progress Notes (Signed)
   Subjective:  Patient ID: Juan Johnston, male    DOB: April 29, 1950  Age: 67 y.o. MRN: [id number]  CC: f/u scrotal abscess  HPI Juan Johnston a 67 y.o.malewith a medical history of CAP, tobacco use, and oral candidiasis presents on f/u of scrotal abscess. He was sent to the urgent care for I&D since proper instrumentation was not available here. Patient had I&D performed and is taking his Clindamycin as directed. Feels much better. Pain, redness, and swelling are significantly reduced. No f/c/n/v or increased warmth.      Outpatient Medications Prior to Visit  Medication Sig Dispense Refill  . clindamycin (CLEOCIN) 300 MG capsule Take 1 capsule (300 mg total) by mouth 4 (four) times daily for 7 days. 28 capsule 0  . darunavir-cobicistat (PREZCOBIX) 800-150 MG tablet Take 1 tablet by mouth daily with breakfast. Swallow whole. Do NOT crush, break or chew tablets. Take with food. 30 tablet 5  . emtricitabine-tenofovir AF (DESCOVY) 200-25 MG tablet Take 1 tablet by mouth daily. 30 tablet 5  . hydrOXYzine (ATARAX/VISTARIL) 25 MG tablet Take 1 tablet (25 mg total) by mouth 3 (three) times daily as needed. 60 tablet 0  . pantoprazole (PROTONIX) 40 MG tablet Take 1 tablet (40 mg total) by mouth 2 (two) times daily. (Patient not taking: Reported on 04/21/2017) 60 tablet 0  . sucralfate (CARAFATE) 1 g tablet take 1 tablets by mouth four times a day on empty stomach before meals and at bedtime  0  . sulfamethoxazole-trimethoprim (BACTRIM DS,SEPTRA DS) 800-160 MG tablet   5  . sulfamethoxazole-trimethoprim (BACTRIM) 400-80 MG tablet Take 1 tablet by mouth daily. (Patient not taking: Reported on 05/19/2017) 30 tablet 5   No facility-administered medications prior to visit.      ROS Review of Systems  Constitutional: Negative for chills, fever and malaise/fatigue.  Eyes: Negative for blurred vision.  Respiratory: Negative for shortness of breath.   Cardiovascular: Negative for chest pain and  palpitations.  Gastrointestinal: Negative for abdominal pain and nausea.  Genitourinary: Negative for dysuria and hematuria.  Musculoskeletal: Negative for joint pain and myalgias.  Skin: Negative for rash.  Neurological: Negative for tingling and headaches.  Psychiatric/Behavioral: Negative for depression. The patient is not nervous/anxious.     Objective:  BP 135/87 (BP Location: Right Arm, Patient Position: Sitting, Cuff Size: Normal)   Pulse 82   Temp 97.6 F (36.4 C) (Oral)   Wt 158 lb 6.4 oz (71.8 kg)   SpO2 97%   BMI 24.08 kg/m   BP/Weight 06/22/2017 06/15/2017 0/86/5784  Systolic BP 696 295 284  Diastolic BP 87 73 87  Wt. (Lbs) 158.4 - 158  BMI 24.08 - 24.02      Physical Exam  Constitutional: He is oriented to person, place, and time.  Thin, appears well  Neurological: He is alert and oriented to person, place, and time.  Skin:  Abscess on left scrotum with moderate induration but no erythema, edema, ecchymosis, suppuration, bleeding, increased warmth, or tenderness.  Psychiatric: He has a normal mood and affect. His behavior is normal.     Assessment & Plan:     1. Scrotal abscess - Continue taking clindamycin as directed. Patient educated on symptoms of C diff colitis with use of Clindamycin and to call me if sxs appear.     Follow-up: Return if symptoms worsen or fail to improve.   [name] PA

## 2017-07-05 ENCOUNTER — Encounter: Payer: Self-pay | Admitting: Infectious Diseases

## 2017-07-05 ENCOUNTER — Ambulatory Visit (INDEPENDENT_AMBULATORY_CARE_PROVIDER_SITE_OTHER): Payer: Self-pay | Admitting: Infectious Diseases

## 2017-07-05 VITALS — BP 106/72 | HR 82 | Temp 97.6°F | Wt 160.0 lb

## 2017-07-05 DIAGNOSIS — R05 Cough: Secondary | ICD-10-CM

## 2017-07-05 DIAGNOSIS — Z0000 Encounter for general adult medical examination without abnormal findings: Secondary | ICD-10-CM

## 2017-07-05 DIAGNOSIS — B20 Human immunodeficiency virus [HIV] disease: Secondary | ICD-10-CM

## 2017-07-05 DIAGNOSIS — R059 Cough, unspecified: Secondary | ICD-10-CM

## 2017-07-05 MED ORDER — SULFAMETHOXAZOLE-TRIMETHOPRIM 800-160 MG PO TABS: 1.0000 | ORAL_TABLET | Freq: Two times a day (BID) | ORAL | 6 refills | Status: DC

## 2017-07-05 MED ORDER — PREDNISONE 20 MG PO TABS
20.0000 mg | ORAL_TABLET | Freq: Every day | ORAL | 0 refills | Status: AC
Start: 2017-07-05 — End: 2017-07-12

## 2017-07-05 NOTE — Patient Instructions (Addendum)
Benadryl can help with itching and makes you sleepy - take this one at night for itching.   Hydroxyzine can also help with itching but won't make you sleepy - take this one during the day for itching.   Continue your Prezcobix and Descovy once a day with food.   Will restart your bactrim once a day - continue taking this until we tell you to stop.   Prednisone one tablet once daily with food at breakfast.   I think your cough may be a lingering viral cold that has not quite gone away yet. I do not think you need an antibiotic at this point.   If your cough worsens or you start feeling short of breath or develop fevers please call us back to see you again as things can change.   Otherwise will follow up in 3 months with labs prior to your visit.

## 2017-07-05 NOTE — Progress Notes (Signed)
Name:Juan Johnston  DOB: [birth year]  MRN: [id number]  PCP: [name], PA-C   Chief Complaint  Patient presents with  . Follow-up   Patient Active Problem List   Diagnosis Date Noted  . Cough 07/15/2017  . 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 Thyroid goiter 05/19/2017  . AIDS (acquired immune deficiency syndrome) ([place]) 04/21/2017  . Exposure to hepatitis C 04/21/2017  . Healthcare maintenance 04/21/2017  . Hyperlipidemia 04/21/2017    Brief Narrative:  Brief Narrative:  Juan Johnston is a 67 y.o. AA male with HIV infection diagnosed 03-2017. His CD4 nadir was 70 and was with AIDS defining illness (esophageal candidiasis/oral thrush). HIV Risk: MSM / HIV+ partner. OI Hx: esophageal candidiasis.   Previous Regimen:   Prezcobix + Descovy 2018  Genotype:   04-2017: K103N, E138G (R-nevirapine, efavirenz; I-rilpivirine)   Subjective:   HPI:  Juan Johnston is here for follow up with his partner today for HIV. He has done well taking his Prezcobix + Descovy every day. He reports no issues or concerning side effects. Although he is still somewhat fatigued and needs breaks throughout the day he does have some increased energy and has put on a few pounds. Cough has been going on a few weeks now. Mostly dry. He denies fevers, weight loss or dyspnea on exertion/SOB. His partner joins him in the visit today and reports it is getting worse although Shourya disagrees.   He is not presently taking bactrim. Was taking clindamycin for a scrotal abscess he had drained in the ED about a month ago.   Review of Systems  Constitutional: Negative for chills, fever, malaise/fatigue and weight loss.  HENT: Negative for sore throat.        No dental problems  Respiratory: Positive for cough. Negative for hemoptysis, sputum production, shortness of breath and wheezing.   Cardiovascular: Negative for chest pain and leg swelling.  Gastrointestinal: Negative for abdominal pain, diarrhea and vomiting.    Genitourinary: Negative for dysuria and flank pain.  Musculoskeletal: Negative for joint pain, myalgias and neck pain.  Skin: Negative for itching and rash.  Neurological: Negative for dizziness, tingling and headaches.  Psychiatric/Behavioral: Negative for depression and substance abuse. The patient is not nervous/anxious and does not have insomnia.     Past Medical History:  Diagnosis Date  . HIV infection ([place])   . Thyroid goiter    Outpatient Medications Prior to Visit  Medication Sig Dispense Refill  . clindamycin (CLEOCIN) 300 MG capsule Take 300 mg by mouth 3 (three) times daily.    . darunavir-cobicistat (PREZCOBIX) 800-150 MG tablet Take 1 tablet by mouth daily with breakfast. Swallow whole. Do NOT crush, break or chew tablets. Take with food. 30 tablet 5  . diphenhydrAMINE (BENADRYL) 25 MG tablet Take 25 mg by mouth every 6 (six) hours as needed.    Marland Kitchen emtricitabine-tenofovir AF (DESCOVY) 200-25 MG tablet Take 1 tablet by mouth daily. 30 tablet 5  . hydrOXYzine (ATARAX/VISTARIL) 25 MG tablet Take 1 tablet (25 mg total) by mouth 3 (three) times daily as needed. 60 tablet 0  . pantoprazole (PROTONIX) 40 MG tablet Take 1 tablet (40 mg total) by mouth 2 (two) times daily. (Patient not taking: Reported on 04/21/2017) 60 tablet 0  . sucralfate (CARAFATE) 1 g tablet take 1 tablets by mouth four times a day on empty stomach before meals and at bedtime  0   No facility-administered medications prior to visit.    No Known Allergies  Social History   Tobacco  Use  . Smoking status: Former Smoker    Packs/day: 0.50    Types: Cigarettes    Last attempt to quit: 02/03/2017    Years since quitting: 0.4  . Smokeless tobacco: Never Used  Substance Use Topics  . Alcohol use: Yes  . Drug use: No    Family History  Problem Relation Age of Onset  . Hypertension Mother     Social History   Substance and Sexual Activity  Sexual Activity Yes  . Partners: Male  . Birth  control/protection: None    Objective:   Vitals:   07/05/17 1136  BP: 106/72  Pulse: 82  Temp: 97.6 F (36.4 C)  TempSrc: Oral  Weight: 160 lb (72.6 kg)   Body mass index is 24.33 kg/m.   Physical Exam  Constitutional: He is oriented to person, place, and time and well-developed, well-nourished, and in no distress.  HENT:  Mouth/Throat: Oropharynx is clear and moist. No oral lesions. Normal dentition. No dental caries.  Eyes: Pupils are equal, round, and reactive to light. No scleral icterus.  Neck: Thyromegaly (goiter present ) present.  Cardiovascular: Normal rate, regular rhythm and normal heart sounds.  Pulmonary/Chest: Effort normal and breath sounds normal. No respiratory distress. He has no wheezes. He has no rales.  Dry cough observed intermittently   Abdominal: Soft. He exhibits no distension. There is no tenderness.  Musculoskeletal: Normal range of motion.  Lymphadenopathy:    He has no cervical adenopathy.  Neurological: He is alert and oriented to person, place, and time.  Skin: Skin is warm and dry. No rash noted.  Psychiatric: Mood and affect normal.  Vitals reviewed.  Lab Results Lab Results  Component Value Date   WBC 3.5 (L) 04/06/2017   HGB 12.5 (L) 04/06/2017   HCT 37.5 (L) 04/06/2017   MCV 89.7 04/06/2017   PLT 181 04/06/2017    Lab Results  Component Value Date   CREATININE 1.02 05/19/2017   BUN 18 05/19/2017   NA 134 (L) 05/19/2017   K 4.3 05/19/2017   CL 101 05/19/2017   CO2 26 05/19/2017    Lab Results  Component Value Date   ALT 22 05/19/2017   AST 16 05/19/2017   ALKPHOS 91 02/06/2017   BILITOT 0.5 05/19/2017    Lab Results  Component Value Date   CHOL 262 (H) 04/06/2017   HDL 50 04/06/2017   LDLCALC 183 (H) 04/06/2017   TRIG 145 04/06/2017   CHOLHDL 5.2 (H) 04/06/2017   HIV 1 RNA Quant  Date Value  05/19/2017 497 copies/mL (H)  04/21/2017 348,000 Copies/mL (H)  04/06/2017 1,210,000 Copies/mL (H)   CD4 T Cell Abs  (/uL)  Date Value  05/19/2017 100 (L)  04/06/2017 70 (L)   No results found for: HAV Lab Results  Component Value Date   HEPBSAG NON-REACTIVE 04/06/2017   HEPBSAB NON-REACTIVE 04/06/2017   No results found for: HCVAB No results found for: CHLAMYDIAWP, N No results found for: GCPROBEAPT No results found for: QUANTGOLD No results found for: RPR    Problem List Items Addressed This Visit      Other   AIDS (acquired immune deficiency syndrome) ([place]) - Primary    Doing well on his Prezcobix + Descovy and will continue these. Reviewed his labs and reduction of his viral loads. Will check VL/CD4 again in a few months and have him resume his Bactrim for OI prophylaxis until he returns in 3 months.       Relevant Medications  clindamycin (CLEOCIN) 300 MG capsule   sulfamethoxazole-trimethoprim (BACTRIM DS,SEPTRA DS) 800-160 MG tablet   Other Relevant Orders   HIV 1 RNA quant-no reflex-bld   T-helper cell (CD4)- ([facility] clinic only)   Healthcare maintenance    Will vaccinate Hep B and A along with Prevnar at upcoming appointment. Pneumovax to follow 8w after prevnar.       Cough    Lung assessment unrevealing aside from some scattered wheezing. He is not hypoxic and has a dry cough observed occasionally. Presumed this to be tail end of viral bronchitis. Will give short prednisone taper to see if it helps with symptoms. Recommended OTC delsym for cough suppressant if it keeps him up at night (which he denies). Advised to return should he worsen for re-evaluation.         Janene Madeira, MSN, NP-C [facility] [place] [facility] Pager: [phone]

## 2017-07-15 DIAGNOSIS — R059 Cough, unspecified: Secondary | ICD-10-CM | POA: Insufficient documentation

## 2017-07-15 DIAGNOSIS — R05 Cough: Secondary | ICD-10-CM | POA: Insufficient documentation

## 2017-07-15 NOTE — Assessment & Plan Note (Signed)
Lung assessment unrevealing aside from some scattered wheezing. He is not hypoxic and has a dry cough observed occasionally. Presumed this to be tail end of viral bronchitis. Will give short prednisone taper to see if it helps with symptoms. Recommended OTC delsym for cough suppressant if it keeps him up at night (which he denies). Advised to return should he worsen for re-evaluation.

## 2017-07-15 NOTE — Assessment & Plan Note (Addendum)
Doing well on his Prezcobix + Descovy and will continue these. Reviewed his labs and reduction of his viral loads. Will check VL/CD4 again in a few months and have him resume his Bactrim for OI prophylaxis until he returns in 3 months.

## 2017-07-15 NOTE — Assessment & Plan Note (Signed)
Will vaccinate Hep B and A along with Prevnar at upcoming appointment. Pneumovax to follow 8w after prevnar.

## 2017-07-19 ENCOUNTER — Encounter (HOSPITAL_COMMUNITY): Payer: Self-pay

## 2017-07-19 ENCOUNTER — Ambulatory Visit (HOSPITAL_BASED_OUTPATIENT_CLINIC_OR_DEPARTMENT_OTHER)
Admission: RE | Admit: 2017-07-19 | Discharge: 2017-07-19 | Disposition: A | Discharge status: Home / Self Care | Payer: Medicare Other | Source: Ambulatory Visit | Attending: Family Medicine | Admitting: Family Medicine

## 2017-07-19 ENCOUNTER — Ambulatory Visit (HOSPITAL_COMMUNITY)
Admission: EM | Admit: 2017-07-19 | Discharge: 2017-07-19 | Disposition: A | Discharge status: Home / Self Care | Payer: Self-pay | Attending: Family Medicine | Admitting: Family Medicine

## 2017-07-19 ENCOUNTER — Inpatient Hospital Stay (HOSPITAL_COMMUNITY)
Admission: EM | Admit: 2017-07-19 | Discharge: 2017-07-23 | DRG: 271 | Disposition: A | Discharge status: Home / Self Care | Payer: Medicare Other | Attending: Internal Medicine | Admitting: Internal Medicine

## 2017-07-19 ENCOUNTER — Encounter (HOSPITAL_COMMUNITY): Payer: Self-pay | Admitting: Emergency Medicine

## 2017-07-19 DIAGNOSIS — K219 Gastro-esophageal reflux disease without esophagitis: Secondary | ICD-10-CM | POA: Diagnosis present

## 2017-07-19 DIAGNOSIS — I82431 Acute embolism and thrombosis of right popliteal vein: Secondary | ICD-10-CM | POA: Diagnosis present

## 2017-07-19 DIAGNOSIS — I82221 Chronic embolism and thrombosis of inferior vena cava: Secondary | ICD-10-CM | POA: Diagnosis present

## 2017-07-19 DIAGNOSIS — B20 Human immunodeficiency virus [HIV] disease: Secondary | ICD-10-CM | POA: Diagnosis present

## 2017-07-19 DIAGNOSIS — I82401 Acute embolism and thrombosis of unspecified deep veins of right lower extremity: Secondary | ICD-10-CM | POA: Insufficient documentation

## 2017-07-19 DIAGNOSIS — Z21 Asymptomatic human immunodeficiency virus [HIV] infection status: Secondary | ICD-10-CM | POA: Diagnosis present

## 2017-07-19 DIAGNOSIS — I82411 Acute embolism and thrombosis of right femoral vein: Secondary | ICD-10-CM | POA: Insufficient documentation

## 2017-07-19 DIAGNOSIS — I82512 Chronic embolism and thrombosis of left femoral vein: Secondary | ICD-10-CM | POA: Diagnosis present

## 2017-07-19 DIAGNOSIS — I82501 Chronic embolism and thrombosis of unspecified deep veins of right lower extremity: Secondary | ICD-10-CM | POA: Diagnosis present

## 2017-07-19 DIAGNOSIS — Z87891 Personal history of nicotine dependence: Secondary | ICD-10-CM

## 2017-07-19 DIAGNOSIS — M79609 Pain in unspecified limb: Secondary | ICD-10-CM

## 2017-07-19 DIAGNOSIS — E871 Hypo-osmolality and hyponatremia: Secondary | ICD-10-CM | POA: Diagnosis present

## 2017-07-19 DIAGNOSIS — R609 Edema, unspecified: Secondary | ICD-10-CM

## 2017-07-19 DIAGNOSIS — R509 Fever, unspecified: Secondary | ICD-10-CM

## 2017-07-19 DIAGNOSIS — I82511 Chronic embolism and thrombosis of right femoral vein: Secondary | ICD-10-CM | POA: Diagnosis present

## 2017-07-19 DIAGNOSIS — Z8249 Family history of ischemic heart disease and other diseases of the circulatory system: Secondary | ICD-10-CM

## 2017-07-19 DIAGNOSIS — I82521 Chronic embolism and thrombosis of right iliac vein: Secondary | ICD-10-CM | POA: Diagnosis not present

## 2017-07-19 DIAGNOSIS — I82890 Acute embolism and thrombosis of other specified veins: Secondary | ICD-10-CM | POA: Diagnosis present

## 2017-07-19 DIAGNOSIS — I82532 Chronic embolism and thrombosis of left popliteal vein: Secondary | ICD-10-CM | POA: Diagnosis present

## 2017-07-19 DIAGNOSIS — M7989 Other specified soft tissue disorders: Secondary | ICD-10-CM

## 2017-07-19 DIAGNOSIS — E785 Hyperlipidemia, unspecified: Secondary | ICD-10-CM | POA: Diagnosis present

## 2017-07-19 DIAGNOSIS — I82441 Acute embolism and thrombosis of right tibial vein: Secondary | ICD-10-CM | POA: Diagnosis present

## 2017-07-19 DIAGNOSIS — Z79899 Other long term (current) drug therapy: Secondary | ICD-10-CM

## 2017-07-19 DIAGNOSIS — I82409 Acute embolism and thrombosis of unspecified deep veins of unspecified lower extremity: Secondary | ICD-10-CM | POA: Diagnosis present

## 2017-07-19 LAB — CBC WITH DIFFERENTIAL/PLATELET
BASOS ABS: 0 10*3/uL (ref 0.0–0.1)
BASOS PCT: 0 %
Eosinophils Absolute: 0.3 10*3/uL (ref 0.0–0.7)
Eosinophils Relative: 5 %
HEMATOCRIT: 43.9 % (ref 39.0–52.0)
Hemoglobin: 14.7 g/dL (ref 13.0–17.0)
LYMPHS PCT: 20 %
Lymphs Abs: 1.2 10*3/uL (ref 0.7–4.0)
MCH: 32.2 pg (ref 26.0–34.0)
MCHC: 33.5 g/dL (ref 30.0–36.0)
MCV: 96.1 fL (ref 78.0–100.0)
Monocytes Absolute: 0.4 10*3/uL (ref 0.1–1.0)
Monocytes Relative: 7 %
NEUTROS ABS: 4.2 10*3/uL (ref 1.7–7.7)
Neutrophils Relative %: 68 %
PLATELETS: 163 10*3/uL (ref 150–400)
RBC: 4.57 MIL/uL (ref 4.22–5.81)
RDW: 14.7 % (ref 11.5–15.5)
WBC: 6.1 10*3/uL (ref 4.0–10.5)

## 2017-07-19 LAB — BASIC METABOLIC PANEL
Anion gap: 9 (ref 5–15)
BUN: 8 mg/dL (ref 6–20)
CHLORIDE: 103 mmol/L (ref 101–111)
CO2: 26 mmol/L (ref 22–32)
Calcium: 9.8 mg/dL (ref 8.9–10.3)
Creatinine, Ser: 0.96 mg/dL (ref 0.61–1.24)
GFR calc Af Amer: >60 mL/min (ref >60)
GFR calc non Af Amer: >60 mL/min (ref >60)
GLUCOSE: 127 mg/dL — AB (ref 65–99)
POTASSIUM: 4.1 mmol/L (ref 3.5–5.1)
Sodium: 138 mmol/L (ref 135–145)

## 2017-07-19 MED ORDER — ACETAMINOPHEN 650 MG RE SUPP: 650.0000 mg | Freq: Four times a day (QID) | RECTAL | Status: DC | PRN

## 2017-07-19 MED ORDER — SULFAMETHOXAZOLE-TRIMETHOPRIM 800-160 MG PO TABS
1.0000 | ORAL_TABLET | Freq: Every day | ORAL | Status: DC
Start: 2017-07-20 — End: 2017-07-23
  Administered 2017-07-20 – 2017-07-23 (×4): 1 via ORAL
  Filled 2017-07-19 (×5): qty 1

## 2017-07-19 MED ORDER — ZOLPIDEM TARTRATE 5 MG PO TABS: 5.0000 mg | ORAL_TABLET | Freq: Every evening | ORAL | Status: DC | PRN

## 2017-07-19 MED ORDER — HEPARIN (PORCINE) IN NACL 100-0.45 UNIT/ML-% IJ SOLN
1300.0000 [IU]/h | INTRAMUSCULAR | Status: DC
  Administered 2017-07-19: 1300 [IU]/h via INTRAVENOUS
  Filled 2017-07-19 (×2): qty 250

## 2017-07-19 MED ORDER — DIPHENHYDRAMINE HCL 25 MG PO CAPS: 25.0000 mg | ORAL_CAPSULE | Freq: Four times a day (QID) | ORAL | Status: DC | PRN

## 2017-07-19 MED ORDER — ONDANSETRON HCL 4 MG/2ML IJ SOLN: 4.0000 mg | Freq: Three times a day (TID) | INTRAMUSCULAR | Status: DC | PRN

## 2017-07-19 MED ORDER — OXYCODONE-ACETAMINOPHEN 5-325 MG PO TABS
1.0000 | ORAL_TABLET | ORAL | Status: DC | PRN
Start: 2017-07-19 — End: 2017-07-23
  Administered 2017-07-20 – 2017-07-22 (×4): 1 via ORAL
  Filled 2017-07-19 (×4): qty 1

## 2017-07-19 MED ORDER — SUCRALFATE 1 G PO TABS: 1.0000 g | ORAL_TABLET | Freq: Two times a day (BID) | ORAL | Status: DC

## 2017-07-19 MED ORDER — PANTOPRAZOLE SODIUM 40 MG PO TBEC
40.0000 mg | DELAYED_RELEASE_TABLET | Freq: Two times a day (BID) | ORAL | Status: DC
  Administered 2017-07-20 – 2017-07-23 (×6): 40 mg via ORAL
  Filled 2017-07-19 (×7): qty 1

## 2017-07-19 MED ORDER — EMTRICITABINE-TENOFOVIR AF 200-25 MG PO TABS
1.0000 | ORAL_TABLET | Freq: Every day | ORAL | Status: DC
  Administered 2017-07-20 – 2017-07-22 (×3): 1 via ORAL
  Filled 2017-07-19 (×3): qty 1

## 2017-07-19 MED ORDER — DARUNAVIR-COBICISTAT 800-150 MG PO TABS
1.0000 | ORAL_TABLET | Freq: Every day | ORAL | Status: DC
  Administered 2017-07-20 – 2017-07-22 (×3): 1 via ORAL
  Filled 2017-07-19 (×3): qty 1

## 2017-07-19 MED ORDER — HEPARIN BOLUS VIA INFUSION
5000.0000 [IU] | Freq: Once | INTRAVENOUS | Status: AC
  Administered 2017-07-19: 5000 [IU] via INTRAVENOUS
  Filled 2017-07-19: qty 5000

## 2017-07-19 MED ORDER — POLYETHYLENE GLYCOL 3350 17 G PO PACK: 17.0000 g | PACK | Freq: Every day | ORAL | Status: DC | PRN

## 2017-07-19 MED ORDER — ACETAMINOPHEN 325 MG PO TABS: 650.0000 mg | ORAL_TABLET | Freq: Four times a day (QID) | ORAL | Status: DC | PRN

## 2017-07-19 NOTE — H&P (Signed)
History and Physical    Juan Johnston ZOX:[id number] DOB: 1950-05-10 DOA: 07/19/2017  Referring MD/NP/PA:   PCP: [name], PA-C   Patient coming from:  The patient is coming from home.  At baseline, pt is independent for most of ADL.   Chief Complaint: right leg pain and swelling  HPI: Juan Johnston is a 67 y.o. male with medical history significant of HIV/AIDS (CD4 100 and VL 497 on 05/19/17), gerd, HLD,who presents with right leg pain and swelling.  Pt states that had scrotum cyst surgery 1 month ago, which limited his mobility recently. He developed right leg swelling and pain whichhas been going on for more than 3 days. The pain is constant, 7 out of 10 in safety, sharp, nonradiating. Patient does not have chest pain, shortness breath, palpitation. He has some mild cough with clear mucus production, no fever or chills. Denies nausea, vomiting, diarrhea or abdominal pain. No symptoms of UTI. Pt was seen in UC. He had LE doppler which showed extensive DVT in right leg.  ED Course: pt was found to have WBC 6.1, electrolytes renal function okay, temperature normal, heart rate 90s, no tachypnea, O2 sat 98% on room air. Patient is placed on telemetry bed for observation.  # LE venous Doppler preliminary results showed:  Positive for acute DVT involving right common femoral vein, femoral vein, and popliteal vein, also DVT in right calf veins and right EIV. Positive for age indeterminate in the left femoral vein and popliteal vein.  Negative for DVT in left EIV  IVC not well visualized due to patient not NPO.     Review of Systems:   General: no fevers, chills, no body weight gain, has fatigue HEENT: no blurry vision, hearing changes or sore throat Respiratory: no dyspnea, coughing, wheezing CV: no chest pain, no palpitations GI: no nausea, vomiting, abdominal pain, diarrhea, constipation GU: no dysuria, burning on urination, increased urinary frequency, hematuria  Ext: has  right leg edema Neuro: no unilateral weakness, numbness, or tingling, no vision change or hearing loss Skin: no rash, no skin tear. MSK: No muscle spasm, no deformity, no limitation of range of movement in spin Heme: No easy bruising.  Travel history: No recent long distant travel.  Allergy: No Known Allergies  Past Medical History:  Diagnosis Date  . HIV infection ([place])   . Thyroid goiter     Past Surgical History:  Procedure Laterality Date  . scrotum cyst      Social History:  reports that he quit smoking about 5 months ago. His smoking use included cigarettes. He smoked 0.50 packs per day. He has never used smokeless tobacco. He reports that he drinks alcohol. He reports that he does not use drugs.  Family History:  Family History  Problem Relation Age of Onset  . Hypertension Mother      Prior to Admission medications   Medication Sig Start Date End Date Taking? Authorizing Provider  clindamycin (CLEOCIN) 300 MG capsule Take 300 mg by mouth 3 (three) times daily.    [provider]  darunavir-cobicistat (PREZCOBIX) 800-150 MG tablet Take 1 tablet by mouth daily with breakfast. Swallow whole. Do NOT crush, break or chew tablets. Take with food. 05/09/17   [place] Callas, NP  diphenhydrAMINE (BENADRYL) 25 MG tablet Take 25 mg by mouth every 6 (six) hours as needed.    [provider]  emtricitabine-tenofovir AF (DESCOVY) 200-25 MG tablet Take 1 tablet by mouth daily. 05/09/17   [place] Callas,  NP  hydrOXYzine (ATARAX/VISTARIL) 25 MG tablet Take 1 tablet (25 mg total) by mouth 3 (three) times daily as needed. 05/19/17   [place] Callas, NP  pantoprazole (PROTONIX) 40 MG tablet Take 1 tablet (40 mg total) by mouth 2 (two) times daily. Patient not taking: Reported on 04/21/2017 03/07/17   [name], PA-C  sucralfate (CARAFATE) 1 g tablet take 1 tablets by mouth four times a day on empty stomach before meals and at bedtime 03/11/17   [provider]  sulfamethoxazole-trimethoprim (BACTRIM DS,SEPTRA DS) 800-160 MG tablet Take 1 tablet by mouth 2 (two) times daily. 07/05/17   [place] Callas, NP    Physical Exam: Vitals:   07/20/17 0115 07/20/17 0137 07/20/17 0143 07/20/17 0302  BP: 124/73 120/62 120/62 117/83  Pulse: 87 92 91 86  Resp: (!) 21 (!) 24 19 19   Temp:   98.8 F (37.1 C) 98 F (36.7 C)  TempSrc:   Oral Oral  SpO2: 95% 96% 98% 95%  Weight:    71.3 kg (157 lb 3 oz)   General: Not in acute distress HEENT:       Eyes: PERRL, EOMI, no scleral icterus.       ENT: No discharge from the ears and nose, no pharynx injection, no tonsillar enlargement.        Neck: No JVD, no bruit, no mass felt. Heme: No neck lymph node enlargement. Cardiac: S1/S2, RRR, No murmurs, No gallops or rubs. Respiratory: No rales, wheezing, rhonchi or rubs. GI: Soft, nondistended, nontender, no rebound pain, no organomegaly, BS present. GU: No hematuria Ext: 2+DP/PT pulse bilaterally. Right lower leg is swelling and tender. Musculoskeletal: No joint deformities, No joint redness or warmth, no limitation of ROM in spin. Skin: No rashes.  Neuro: Alert, oriented X3, cranial nerves II-XII grossly intact, moves all extremities normally. Psych: Patient is not psychotic, no suicidal or hemocidal ideation.  Labs on Admission: I have personally reviewed following labs and imaging studies  CBC: Recent Labs  Lab 07/19/17 1700  WBC 6.1  NEUTROABS 4.2  HGB 14.7  HCT 43.9  MCV 96.1  PLT 403   Basic Metabolic Panel: Recent Labs  Lab 07/19/17 1700  NA 138  K 4.1  CL 103  CO2 26  GLUCOSE 127*  BUN 8  CREATININE 0.96  CALCIUM 9.8   GFR: Estimated Creatinine Clearance: 73.2 mL/min (by C-G formula based on SCr of 0.96 mg/dL). Liver Function Tests: No results for input(s): AST, ALT, ALKPHOS, BILITOT, PROT, ALBUMIN in the last 168 hours. No results for input(s): LIPASE, AMYLASE in the last 168 hours. No results for input(s):  AMMONIA in the last 168 hours. Coagulation Profile: No results for input(s): INR, PROTIME in the last 168 hours. Cardiac Enzymes: No results for input(s): CKTOTAL, CKMB, CKMBINDEX, TROPONINI in the last 168 hours. BNP (last 3 results) No results for input(s): PROBNP in the last 8760 hours. HbA1C: No results for input(s): HGBA1C in the last 72 hours. CBG: No results for input(s): GLUCAP in the last 168 hours. Lipid Profile: No results for input(s): CHOL, HDL, LDLCALC, TRIG, CHOLHDL, LDLDIRECT in the last 72 hours. Thyroid Function Tests: No results for input(s): TSH, T4TOTAL, FREET4, T3FREE, THYROIDAB in the last 72 hours. Anemia Panel: No results for input(s): VITAMINB12, FOLATE, FERRITIN, TIBC, IRON, RETICCTPCT in the last 72 hours. Urine analysis:    Component Value Date/Time   COLORURINE YELLOW 02/06/2017 0914   APPEARANCEUR CLEAR 02/06/2017 0914   LABSPEC 1.021 02/06/2017 0914  PHURINE 5.0 02/06/2017 0914   GLUCOSEU NEGATIVE 02/06/2017 0914   HGBUR NEGATIVE 02/06/2017 0914   BILIRUBINUR NEGATIVE 02/06/2017 0914   KETONESUR NEGATIVE 02/06/2017 0914   PROTEINUR NEGATIVE 02/06/2017 0914   NITRITE NEGATIVE 02/06/2017 0914   LEUKOCYTESUR NEGATIVE 02/06/2017 0914   Sepsis Labs: @LABRCNTIP (procalcitonin:4,lacticidven:4) )No results found for this or any previous visit (from the past 240 hour(s)).   Radiological Exams on Admission: No results found.   EKG: Independently reviewed.  Sinus rhythm, QTC 452, borderline LAD, early R-wave progression   Assessment/Plan Principal Problem:   DVT (deep venous thrombosis) ([facility]) Active Problems:   AIDS (acquired immune deficiency syndrome) ([facility])   HIV (human immunodeficiency virus infection) ([facility])   GERD (gastroesophageal reflux disease)   DVT (deep venous thrombosis) ([place]): pt has extensive DVT in right leg. Patient has mild cough, but no chest pain or shortness breath. No tachycardia or O2 desaturation, does not seem to have PE  currently.  -place in tele bed for obs -heparin drip initiated -pain control: When necessary Percocet and tylenol -may need to discuss with [facility] in AM  AIDS/HIV: CD4 100 and VL 497 on 05/19/17 -continue home HIV meds -on Bactrim PPx   GERD: -Protonix  DVT ppx: on IV Heparin    Code Status: Full code Family Communication: None at bed side.    Disposition Plan:  Anticipate discharge back to previous home environment Consults called:  none Admission status: Obs / tele     Date of Service 07/20/2017    Ivor Costa [facility] Pager [phone]  If 7PM-7AM, please contact night-coverage [email or web address] Password [facility] 07/20/2017, 3:26 AM

## 2017-07-19 NOTE — ED Provider Notes (Signed)
[place]   [id number] 07/19/17 Arrival Time: 1350   SUBJECTIVE:  Juan Johnston is a 67 y.o. male who presents to the urgent care with complaint of proximal right leg swelling.  He had a cyst taken off of his testicle a couple weeks ago and was largely inactive at that time.  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  Over the last 2-3 days his right leg is swollen considerably although he has had minimal amounts of pain.  He has had no chest pain or shortness of breath, although he does chronically have some exercise-induced shortness of breath when he goes up 1-2 flights of stairs.     Past Medical History:  Diagnosis Date  . HIV infection ([place])   . Thyroid goiter    Family History  Problem Relation Age of Onset  . Hypertension Mother    Social History   Socioeconomic History  . Marital status: Single    Spouse name: Not on file  . Number of children: Not on file  . Years of education: Not on file  . Highest education level: Not on file  Occupational History  . Not on file  Social Needs  . Financial resource strain: Not on file  . Food insecurity:    Worry: Not on file    Inability: Not on file  . Transportation needs:    Medical: Not on file    Non-medical: Not on file  Tobacco Use  . Smoking status: Former Smoker    Packs/day: 0.50    Types: Cigarettes    Last attempt to quit: 02/03/2017    Years since quitting: 0.4  . Smokeless tobacco: Never Used  Substance and Sexual Activity  . Alcohol use: Yes  . Drug use: No  . Sexual activity: Yes    Partners: Male    Birth control/protection: None  Lifestyle  . Physical activity:    Days per week: Not on file    Minutes per session: Not on file  . Stress: Not on file  Relationships  . Social connections:    Talks on phone: Not on file    Gets together: Not on file    Attends religious service: Not on file    Active member of club or organization: Not on file    Attends  meetings of clubs or organizations: Not on file    Relationship status: Not on file  . Intimate partner violence:    Fear of current or ex partner: Not on file    Emotionally abused: Not on file    Physically abused: Not on file    Forced sexual activity: Not on file  Other Topics Concern  . Not on file  Social History Narrative  . Not on file   No outpatient medications have been marked as taking for the 07/19/17 encounter [facility] Encounter).   No Known Allergies    ROS: As per HPI, remainder of ROS negative.   OBJECTIVE:   Vitals:   07/19/17 1402  BP: 137/69  Pulse: 90  Resp: 16  Temp: 98.3 F (36.8 C)  TempSrc: Temporal  SpO2: 100%     General appearance: alert; no distress Eyes: PERRL; EOMI; conjunctiva normal HENT: normocephalic; atraumatic;  oral mucosa normal Neck: supple Lungs: clear to auscultation bilaterally Heart: regular rate and rhythm Abdomen: soft, non-tender; bowel sounds normal; no masses or organomegaly; no guarding or rebound tenderness Back: no CVA tenderness Extremities: no cyanosis but  there is significant edema from the right ankle to the inguinal ligament.  No tenderness.  No bruit on right groin with palpable right femoral pulse.  Feet have good cap filling Skin: warm and dry; some circumferential exfoliation of entire right leg. Neurologic: normal gait; grossly normal Psychological: alert and cooperative; normal mood and affect      Labs:  Results for orders placed or performed in visit on 05/19/17  TSH  Result Value Ref Range   TSH 1.79 0.40 - 4.50 mIU/L  T4, free  Result Value Ref Range   Free T4 1.1 0.8 - 1.8 ng/dL  HIV RNA, RTPCR W/R GT (RTI, PI,INT)  Result Value Ref Range   HIV 1 RNA Quant 497 (H) copies/mL   HIV-1 RNA Quant, Log 2.70 (H) Log copies/mL  T-helper cell (CD4)- ([facility] clinic only)  Result Value Ref Range   CD4 T Cell Abs 100 (L) 400 - 2,700 /uL   CD4 % Helper T Cell 8 (L) 33 - 55 %  COMPLETE METABOLIC  PANEL WITH GFR  Result Value Ref Range   Glucose, Bld 111 (H) 65 - 99 mg/dL   BUN 18 7 - 25 mg/dL   Creat 1.02 0.70 - 1.25 mg/dL   GFR, Est Non African American 76 > OR = 60 mL/min/1.9m2   GFR, Est African American 88 > OR = 60 mL/min/1.37m2   BUN/Creatinine Ratio NOT APPLICABLE 6 - 22 (calc)   Sodium 134 (L) 135 - 146 mmol/L   Potassium 4.3 3.5 - 5.3 mmol/L   Chloride 101 98 - 110 mmol/L   CO2 26 20 - 32 mmol/L   Calcium 9.8 8.6 - 10.3 mg/dL   Total Protein 7.7 6.1 - 8.1 g/dL   Albumin 4.3 3.6 - 5.1 g/dL   Globulin 3.4 1.9 - 3.7 g/dL (calc)   AG Ratio 1.3 1.0 - 2.5 (calc)   Total Bilirubin 0.5 0.2 - 1.2 mg/dL   Alkaline phosphatase (APISO) 63 40 - 115 U/L   AST 16 10 - 35 U/L   ALT 22 9 - 46 U/L  HIV-1 INTEGRASE GENOTYPE  Result Value Ref Range   Raltegravir Resistance NOT PREDICTED    Elvitegravir Resistance NOT PREDICTED    Dolutegravir Resistance NOT PREDICTED   HIV-1 Genotype  Result Value Ref Range   HIV-1 Genotype DETECTED (A)     Labs Reviewed - No data to display  No results found.     ASSESSMENT & PLAN:  1. Leg swelling   You need to have a venous doppler study to see if you have clots in the right leg that are causing the swelling  No orders of the defined types were placed in this encounter.   Reviewed expectations re: course of current medical issues. Questions answered. Outlined signs and symptoms indicating need for more acute intervention. Patient verbalized understanding. After Visit Summary given.    Procedures:      Robyn Haber, MD 07/19/17 1436

## 2017-07-19 NOTE — Discharge Instructions (Addendum)
You need to have a venous doppler study to see if you have clots in the right leg that are causing the swelling

## 2017-07-19 NOTE — ED Triage Notes (Signed)
Pt c/o R leg swelling x3 days. Pt states his R pinkie finger also is swollen. Denies injury.

## 2017-07-19 NOTE — ED Provider Notes (Signed)
Patient placed in Quick Look pathway, seen and evaluated   Chief Complaint: RLE swelling  HPI:   67 y.o. with PMH/o HIV (Last CD4 100 two months ago) who presents for evaluation of 3 days of right lower extremity swelling.  Patient reports that the swelling was progressively worsening over the last 3 days, prompting PCP visit.  PCP evaluated him today and was concerned about a DVT and symptoms to the ED for further evaluation.  Patient states that he is not very active.  He states that since he has been retired, he has not been doing much physical activity.  He denies any recent hospitalizations, surgeries, long plane, car rides, history of blood clots.  Patient denies any current cancer diagnosis.  Patient reports he is HIV positive and is followed by infectious disease.  He states he has been compliant with his medications.  Patient denies any fevers, chest pain, difficulty breathing.  ROS: RLE edema  Physical Exam:   Gen: No distress  Neuro: Awake and Alert  Skin: Warm    Focused Exam: Edema noted to the right thigh. RLE is larger than LLE.    Initiation of care has begun. The patient has been counseled on the process, plan, and necessity for staying for the completion/evaluation, and the remainder of the medical screening examination   Desma Mcgregor 07/19/17 1704    Valarie Merino, MD 07/20/17 [phone]

## 2017-07-19 NOTE — Progress Notes (Signed)
ANTICOAGULATION CONSULT NOTE - Initial Consult  Pharmacy Consult for heparin Indication: DVT  No Known Allergies  Patient Measurements:   Heparin Dosing Weight: 72 kg  Vital Signs: Temp: 98.1 F (36.7 C) (04/16 1932) Temp Source: Oral (04/16 1932) BP: 128/55 (04/16 2146) Pulse Rate: 95 (04/16 2146)  Labs: Recent Labs    07/19/17 1700  HGB 14.7  HCT 43.9  PLT 163  CREATININE 0.96    CrCl cannot be calculated (Unknown ideal weight.).   Medical History: Past Medical History:  Diagnosis Date  . HIV infection ([place])   . Thyroid goiter     Medications:  Scheduled:  . heparin  5,000 Units Intravenous Once    Assessment: 68 yo presented to ED for RLE swelling with suspected DVT. Pharmacy consulted for heparin dosing. No anticoagulation prior to admit. No signs of bleeding.  Goal of Therapy:  Heparin level 0.3-0.7 units/ml Monitor platelets by anticoagulation protocol: Yes   Plan:  Give 5,000 units bolus x 1 Start heparin infusion at 1,300 units/hr Check anti-Xa level in 6 hours and daily while on heparin Continue to monitor H&H and platelets  Aoki Wedemeyer A Haddie Bruhl 07/19/2017,10:51 PM

## 2017-07-19 NOTE — ED Triage Notes (Signed)
Pt presents after DVT study today. Pt presented for 3 day hx of leg swelling to R leg at [facility] today.

## 2017-07-19 NOTE — ED Notes (Signed)
Heparin rate/bolus confirmed in pump w/ Cleta Alberts RN

## 2017-07-19 NOTE — Progress Notes (Addendum)
Preliminary result by tech--Bilateral lower extremities venous study completed. Positive for acute DVT involving right common femoral vein, femoral vein, and popliteal vein, also DVT in right calf veins and right EIV. Positive for age indeterminate in the left femoral vein and popliteal vein.  Negative for DVT in left EIV  IVC not well visualized due to patient not NPO.    Patient from urgent care, tried to call twice no answer. Patient was taken to ED immediately.   Hongying Landry Mellow (RDMS RVT) 07/19/17 4:12 PM

## 2017-07-19 NOTE — ED Provider Notes (Signed)
[place] EMERGENCY DEPARTMENT Provider Note   CSN: [id number] Arrival date & time: 07/19/17  1622     History   Chief Complaint Chief Complaint  Patient presents with  . Leg Pain    HPI Juan Johnston is a 67 y.o. male.  The history is provided by the patient and medical records.     67 y.o. M with hx of HIV (last CD4 100 in Feb 2019), thyroid goiter, recent scrotal abscess, presenting to the ED for right leg swelling.  States this has been ongoing for 3 days, progressively worsening. States his leg is starting to feel very tight.  He was seen at urgent care and sent here for venous duplex of the leg to assess for DVT.  Patient has not hx of DVT or PE.  He did have scrotal abscess that required I&D a few weeks ago so he has not been moving around that much.  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  He denies any chest pain, SOB, cough, or hemoptysis.  No fever/chills.    Past Medical History:  Diagnosis Date  . HIV infection ([place])   . Thyroid goiter     Patient Active Problem List   Diagnosis Date Noted  . Cough 07/15/2017  . 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 Thyroid goiter 05/19/2017  . AIDS (acquired immune deficiency syndrome) ([place]) 04/21/2017  . Exposure to hepatitis C 04/21/2017  . Healthcare maintenance 04/21/2017  . Hyperlipidemia 04/21/2017    Past Surgical History:  Procedure Laterality Date  . NO PAST SURGERIES          Home Medications    Prior to Admission medications   Medication Sig Start Date End Date Taking? Authorizing Provider  clindamycin (CLEOCIN) 300 MG capsule Take 300 mg by mouth 3 (three) times daily.    [provider]  darunavir-cobicistat (PREZCOBIX) 800-150 MG tablet Take 1 tablet by mouth daily with breakfast. Swallow whole. Do NOT crush, break or chew tablets. Take with food. 05/09/17   [place]on Callas, NP  diphenhydrAMINE (BENADRYL) 25 MG tablet Take 25 mg by mouth every 6  (six) hours as needed.    [provider]  emtricitabine-tenofovir AF (DESCOVY) 200-25 MG tablet Take 1 tablet by mouth daily. 05/09/17   [place] Callas, NP  hydrOXYzine (ATARAX/VISTARIL) 25 MG tablet Take 1 tablet (25 mg total) by mouth 3 (three) times daily as needed. 05/19/17   [place] Callas, NP  pantoprazole (PROTONIX) 40 MG tablet Take 1 tablet (40 mg total) by mouth 2 (two) times daily. Patient not taking: Reported on 04/21/2017 03/07/17   [name], PA-C  sucralfate (CARAFATE) 1 g tablet take 1 tablets by mouth four times a day on empty stomach before meals and at bedtime 03/11/17   [provider]  sulfamethoxazole-trimethoprim (BACTRIM DS,SEPTRA DS) 800-160 MG tablet Take 1 tablet by mouth 2 (two) times daily. 07/05/17   [place] Callas, NP    Family History Family History  Problem Relation Age of Onset  . Hypertension Mother     Social History Social History   Tobacco Use  . Smoking status: Former Smoker    Packs/day: 0.50    Types: Cigarettes    Last attempt to quit: 02/03/2017    Years since quitting: 0.4  . Smokeless tobacco: Never Used  Substance Use Topics  . Alcohol use: Yes  . Drug use: No     Allergies   Patient  has no known allergies.   Review of Systems Review of Systems  Cardiovascular: Positive for leg swelling.  All other systems reviewed and are negative.    Physical Exam Updated Vital Signs BP (!) 128/55 (BP Location: Right Arm)   Pulse 95   Temp 98.1 F (36.7 C) (Oral)   Resp 18   SpO2 98%   Physical Exam  Constitutional: He is oriented to person, place, and time. He appears well-developed and well-nourished.  HENT:  Head: Normocephalic and atraumatic.  Mouth/Throat: Oropharynx is clear and moist.  Eyes: Pupils are equal, round, and reactive to light. Conjunctivae and EOM are normal.  Neck: Normal range of motion.  Cardiovascular: Normal rate, regular rhythm and normal heart sounds.  Pulmonary/Chest:  Effort normal and breath sounds normal. No stridor. No respiratory distress.  Abdominal: Soft. Bowel sounds are normal. There is no tenderness. There is no rebound.  Musculoskeletal: Normal range of motion.  Right leg diffusely swollen compared to left, compartments are tense but remain compressible, no muscle rigidity; DP pulses intact, normal sensation throughout  Neurological: He is alert and oriented to person, place, and time.  Skin: Skin is warm and dry.  Psychiatric: He has a normal mood and affect.  Nursing note and vitals reviewed.    ED Treatments / Results  Labs (all labs ordered are listed, but only abnormal results are displayed) Labs Reviewed  BASIC METABOLIC PANEL - Abnormal; Notable for the following components:      Result Value   Glucose, Bld 127 (*)    All other components within normal limits  CBC WITH DIFFERENTIAL/PLATELET  HEPARIN LEVEL (UNFRACTIONATED)  CBC  BASIC METABOLIC PANEL    EKG None  Radiology No results found.   study completed. Positive for acute DVT involving right common femoral vein, femoral vein, and popliteal vein, also DVT in right calf veins and right EIV. Positive for age indeterminate in the left femoral vein and popliteal vein.  Negative for DVT in left EIV  IVC not well visualized due to patient not NPO.    Patient from urgent care, tried to call twice no answer. Patient was taken to ED immediately.   Hongying Landry Mellow (RDMS RVT) 07/19/17 4:12 PM    Procedures Procedures (including critical care time)  CRITICAL CARE Performed by: Larene Pickett   Total critical care time: 35 minutes  Critical care time was exclusive of separately billable procedures and treating other patients.  Critical care was necessary to treat or prevent imminent or life-threatening deterioration.  Critical care was time spent personally by me on the following activities: development of treatment plan with patient and/or surrogate as well as nursing,  discussions with consultants, evaluation of patient's response to treatment, examination of patient, obtaining history from patient or surrogate, ordering and performing treatments and interventions, ordering and review of laboratory studies, ordering and review of radiographic studies, pulse oximetry and re-evaluation of patient's condition.   Medications Ordered in ED Medications - No data to display   Initial Impression / Assessment and Plan / ED Course  I have reviewed the triage vital signs and the nursing notes.  Pertinent labs & imaging results that were available during my care of the patient were reviewed by me and considered in my medical decision making (see chart for details).  67 y.o. M here with right leg swelling.  Had venous duplex earlier today with extensive clot all throughout right leg and some age indeterminate clot in left leg.  Right leg is  swollen compared to left but compartments remain compressible.  Leg is NVI.  He adamantly denies any chest pain or SOB on questioning.  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  Given extensiveness of his clot, will start on heparin and admit for ongoing care.  Discussed with Dr. Blaine Hamper-- will admit for ongoing care.  Final Clinical Impressions(s) / ED Diagnoses   Final diagnoses:  Leg DVT (deep venous thromboembolism), acute, right [facility])  History of HIV infection [facility])    ED Discharge Orders    None       Larene Pickett, PA-C 07/20/17 2763    Pattricia Boss, MD 07/21/17 [phone]

## 2017-07-19 NOTE — ED Notes (Signed)
Left message with vascular department about pt needing doppler; instructed pt to go to heart and vascular

## 2017-07-20 ENCOUNTER — Encounter (HOSPITAL_COMMUNITY): Payer: Self-pay | Admitting: Internal Medicine

## 2017-07-20 ENCOUNTER — Encounter (HOSPITAL_COMMUNITY)
Admission: EM | Disposition: A | Discharge status: Home / Self Care | Payer: Self-pay | Source: Home / Self Care | Attending: Internal Medicine

## 2017-07-20 ENCOUNTER — Encounter (HOSPITAL_COMMUNITY): Payer: Self-pay

## 2017-07-20 DIAGNOSIS — I82511 Chronic embolism and thrombosis of right femoral vein: Secondary | ICD-10-CM | POA: Diagnosis present

## 2017-07-20 DIAGNOSIS — I82532 Chronic embolism and thrombosis of left popliteal vein: Secondary | ICD-10-CM | POA: Diagnosis present

## 2017-07-20 DIAGNOSIS — I82431 Acute embolism and thrombosis of right popliteal vein: Secondary | ICD-10-CM | POA: Diagnosis present

## 2017-07-20 DIAGNOSIS — I82501 Chronic embolism and thrombosis of unspecified deep veins of right lower extremity: Secondary | ICD-10-CM | POA: Diagnosis present

## 2017-07-20 DIAGNOSIS — I82441 Acute embolism and thrombosis of right tibial vein: Secondary | ICD-10-CM | POA: Diagnosis present

## 2017-07-20 DIAGNOSIS — I82512 Chronic embolism and thrombosis of left femoral vein: Secondary | ICD-10-CM | POA: Diagnosis present

## 2017-07-20 DIAGNOSIS — E785 Hyperlipidemia, unspecified: Secondary | ICD-10-CM | POA: Diagnosis present

## 2017-07-20 DIAGNOSIS — K219 Gastro-esophageal reflux disease without esophagitis: Secondary | ICD-10-CM | POA: Diagnosis present

## 2017-07-20 DIAGNOSIS — Z79899 Other long term (current) drug therapy: Secondary | ICD-10-CM | POA: Diagnosis not present

## 2017-07-20 DIAGNOSIS — B20 Human immunodeficiency virus [HIV] disease: Secondary | ICD-10-CM | POA: Diagnosis present

## 2017-07-20 DIAGNOSIS — I82890 Acute embolism and thrombosis of other specified veins: Secondary | ICD-10-CM | POA: Diagnosis present

## 2017-07-20 DIAGNOSIS — Z8249 Family history of ischemic heart disease and other diseases of the circulatory system: Secondary | ICD-10-CM | POA: Diagnosis not present

## 2017-07-20 DIAGNOSIS — I82221 Chronic embolism and thrombosis of inferior vena cava: Secondary | ICD-10-CM | POA: Diagnosis present

## 2017-07-20 DIAGNOSIS — E871 Hypo-osmolality and hyponatremia: Secondary | ICD-10-CM | POA: Diagnosis present

## 2017-07-20 DIAGNOSIS — I824Y1 Acute embolism and thrombosis of unspecified deep veins of right proximal lower extremity: Secondary | ICD-10-CM

## 2017-07-20 DIAGNOSIS — I82521 Chronic embolism and thrombosis of right iliac vein: Secondary | ICD-10-CM | POA: Diagnosis present

## 2017-07-20 DIAGNOSIS — Z87891 Personal history of nicotine dependence: Secondary | ICD-10-CM | POA: Diagnosis not present

## 2017-07-20 HISTORY — PX: PERIPHERAL VASCULAR THROMBECTOMY: CATH118306

## 2017-07-20 LAB — CBC
HCT: 39.1 % (ref 39.0–52.0)
HEMATOCRIT: 40.4 % (ref 39.0–52.0)
Hemoglobin: 13.3 g/dL (ref 13.0–17.0)
Hemoglobin: 12.9 g/dL — ABNORMAL LOW (ref 13.0–17.0)
MCH: 31.4 pg (ref 26.0–34.0)
MCH: 31.3 pg (ref 26.0–34.0)
MCHC: 32.9 g/dL (ref 30.0–36.0)
MCHC: 33 g/dL (ref 30.0–36.0)
MCV: 95.5 fL (ref 78.0–100.0)
MCV: 94.9 fL (ref 78.0–100.0)
PLATELETS: 150 10*3/uL (ref 150–400)
PLATELETS: 153 10*3/uL (ref 150–400)
RBC: 4.23 MIL/uL (ref 4.22–5.81)
RBC: 4.12 MIL/uL — AB (ref 4.22–5.81)
RDW: 14.5 % (ref 11.5–15.5)
RDW: 14 % (ref 11.5–15.5)
WBC: 6.6 10*3/uL (ref 4.0–10.5)
WBC: 7.4 10*3/uL (ref 4.0–10.5)

## 2017-07-20 LAB — BASIC METABOLIC PANEL
ANION GAP: 12 (ref 5–15)
BUN: 9 mg/dL (ref 6–20)
CALCIUM: 9.4 mg/dL (ref 8.9–10.3)
CO2: 22 mmol/L (ref 22–32)
CREATININE: 0.99 mg/dL (ref 0.61–1.24)
Chloride: 100 mmol/L — ABNORMAL LOW (ref 101–111)
Glucose, Bld: 170 mg/dL — ABNORMAL HIGH (ref 65–99)
Potassium: 3.8 mmol/L (ref 3.5–5.1)
Sodium: 134 mmol/L — ABNORMAL LOW (ref 135–145)

## 2017-07-20 LAB — HEPARIN LEVEL (UNFRACTIONATED)
HEPARIN UNFRACTIONATED: 0.67 [IU]/mL (ref 0.30–0.70)
Heparin Unfractionated: 0.59 IU/mL (ref 0.30–0.70)

## 2017-07-20 LAB — FIBRINOGEN: Fibrinogen: 642 mg/dL — ABNORMAL HIGH (ref 210–475)

## 2017-07-20 LAB — MRSA PCR SCREENING: MRSA BY PCR: NEGATIVE

## 2017-07-20 SURGERY — PERIPHERAL VASCULAR THROMBECTOMY
Anesthesia: LOCAL

## 2017-07-20 MED ORDER — SODIUM CHLORIDE 0.9 % IV SOLN
INTRAVENOUS | Status: DC
  Administered 2017-07-20 – 2017-07-23 (×4): via INTRAVENOUS

## 2017-07-20 MED ORDER — MIDAZOLAM HCL 2 MG/2ML IJ SOLN
INTRAMUSCULAR | Status: AC
  Filled 2017-07-20: qty 2

## 2017-07-20 MED ORDER — MORPHINE SULFATE (PF) 10 MG/ML IV SOLN: 5.0000 mg | INTRAVENOUS | Status: DC | PRN

## 2017-07-20 MED ORDER — HEPARIN (PORCINE) IN NACL 2-0.9 UNITS/ML
INTRAMUSCULAR | Status: AC | PRN
  Administered 2017-07-20: 500 mL

## 2017-07-20 MED ORDER — MIDAZOLAM HCL 2 MG/2ML IJ SOLN
INTRAMUSCULAR | Status: DC | PRN
  Administered 2017-07-20 (×4): 1 mg via INTRAVENOUS

## 2017-07-20 MED ORDER — FENTANYL CITRATE (PF) 100 MCG/2ML IJ SOLN
INTRAMUSCULAR | Status: AC
  Filled 2017-07-20: qty 2

## 2017-07-20 MED ORDER — LIDOCAINE HCL (PF) 1 % IJ SOLN
INTRAMUSCULAR | Status: AC
  Filled 2017-07-20: qty 30

## 2017-07-20 MED ORDER — SODIUM CHLORIDE 0.9 % IV SOLN
0.5000 mg/h | INTRAVENOUS | Status: DC
  Administered 2017-07-20: 0.5 mg/h
  Filled 2017-07-20 (×3): qty 10

## 2017-07-20 MED ORDER — MORPHINE SULFATE (PF) 4 MG/ML IV SOLN: 5.0000 mg | INTRAVENOUS | Status: DC | PRN

## 2017-07-20 MED ORDER — SODIUM CHLORIDE 0.9 % IV SOLN: 250.0000 mL | INTRAVENOUS | Status: DC | PRN

## 2017-07-20 MED ORDER — SODIUM CHLORIDE 0.9 % IV SOLN: 0.5000 mg/h | INTRAVENOUS | Status: DC

## 2017-07-20 MED ORDER — HEPARIN (PORCINE) IN NACL 1000-0.9 UT/500ML-% IV SOLN
INTRAVENOUS | Status: AC
  Filled 2017-07-20: qty 500

## 2017-07-20 MED ORDER — FENTANYL CITRATE (PF) 100 MCG/2ML IJ SOLN
INTRAMUSCULAR | Status: DC | PRN
  Administered 2017-07-20 (×4): 25 ug via INTRAVENOUS

## 2017-07-20 MED ORDER — MIDAZOLAM HCL 2 MG/2ML IJ SOLN: 1.0000 mg | INTRAMUSCULAR | Status: DC | PRN

## 2017-07-20 MED ORDER — ONDANSETRON HCL 4 MG/2ML IJ SOLN: 4.0000 mg | Freq: Four times a day (QID) | INTRAMUSCULAR | Status: DC | PRN

## 2017-07-20 MED ORDER — LIDOCAINE HCL (PF) 1 % IJ SOLN
INTRAMUSCULAR | Status: DC | PRN
  Administered 2017-07-20: 15 mL

## 2017-07-20 MED ORDER — SODIUM CHLORIDE 0.9% FLUSH: 3.0000 mL | Freq: Two times a day (BID) | INTRAVENOUS | Status: DC

## 2017-07-20 MED ORDER — IODIXANOL 320 MG/ML IV SOLN
INTRAVENOUS | Status: DC | PRN
  Administered 2017-07-20: 10 mL via INTRAVENOUS

## 2017-07-20 MED ORDER — SODIUM CHLORIDE 0.9% FLUSH: 3.0000 mL | INTRAVENOUS | Status: DC | PRN

## 2017-07-20 MED ORDER — DM-GUAIFENESIN ER 30-600 MG PO TB12: 1.0000 | ORAL_TABLET | Freq: Two times a day (BID) | ORAL | Status: DC | PRN

## 2017-07-20 MED ORDER — HEPARIN (PORCINE) IN NACL 100-0.45 UNIT/ML-% IJ SOLN: 1100.0000 [IU]/h | INTRAMUSCULAR | Status: DC

## 2017-07-20 SURGICAL SUPPLY — 18 items
BAG SNAP BAND KOVER 36X36 (MISCELLANEOUS) ×2 IMPLANT
CATH ANGIO 5F BER2 65CM (CATHETERS) ×2 IMPLANT
CATH INFUS 135CMX50CM (CATHETERS) ×2 IMPLANT
CATH VISIONS PV .035 IVUS (CATHETERS) ×2 IMPLANT
COVER DOME SNAP 22 D (MISCELLANEOUS) ×2 IMPLANT
COVER PRB 48X5XTLSCP FOLD TPE (BAG) ×1 IMPLANT
COVER PROBE 5X48 (BAG) ×1
DEVICE TORQUE H2O (MISCELLANEOUS) ×2 IMPLANT
GUIDEWIRE ANGLED .035X150CM (WIRE) ×2 IMPLANT
KIT MICROPUNCTURE NIT STIFF (SHEATH) ×2 IMPLANT
PROTECTION STATION PRESSURIZED (MISCELLANEOUS) ×2
SHEATH AVANTI 11CM 5FR (SHEATH) ×2 IMPLANT
SHEATH AVANTI 11CM 8FR (SHEATH) ×2 IMPLANT
STATION PROTECTION PRESSURIZED (MISCELLANEOUS) ×1 IMPLANT
STOPCOCK MORSE 400PSI 3WAY (MISCELLANEOUS) ×2 IMPLANT
TRAY PV CATH (CUSTOM PROCEDURE TRAY) ×2 IMPLANT
WIRE AMPLATZ SS-J .035X260CM (WIRE) ×2 IMPLANT
WIRE BENTSON .035X145CM (WIRE) ×4 IMPLANT

## 2017-07-20 NOTE — Consult Note (Signed)
Vascular and Vein Specialist of [facility]  Patient name: Juan Johnston MRN: [id number] DOB: [birth year] Sex: male   REQUESTING PROVIDER:    Hospital Service   REASON FOR CONSULT:    DVT  HISTORY OF PRESENT ILLNESS:   Juan Johnston is a 67 y.o. male, who  I have been asked to evaluate for a right leg DVT.  The patient reports that he has been having pain and swelling in his right leg for 3 days.  He has been less active over the course of the past month given a scrotal cyst surgery 1 month ago.  He denies any chest pain or shortness of breath.  The patient suffers from HIV.  He denies any recent bleeding issues.  PAST MEDICAL HISTORY    Past Medical History:  Diagnosis Date  . HIV infection ([place])   . Thyroid goiter      FAMILY HISTORY   Family History  Problem Relation Age of Onset  . Hypertension Mother     SOCIAL HISTORY:   Social History   Socioeconomic History  . Marital status: Single    Spouse name: Not on file  . Number of children: Not on file  . Years of education: Not on file  . Highest education level: Not on file  Occupational History  . Not on file  Social Needs  . Financial resource strain: Not on file  . Food insecurity:    Worry: Not on file    Inability: Not on file  . Transportation needs:    Medical: Not on file    Non-medical: Not on file  Tobacco Use  . Smoking status: Former Smoker    Packs/day: 0.50    Types: Cigarettes    Last attempt to quit: 02/03/2017    Years since quitting: 0.4  . Smokeless tobacco: Never Used  Substance and Sexual Activity  . Alcohol use: Yes  . Drug use: No  . Sexual activity: Yes    Partners: Male    Birth control/protection: None  Lifestyle  . Physical activity:    Days per week: Not on file    Minutes per session: Not on file  . Stress: Not on file  Relationships  . Social connections:    Talks on phone: Not on file    Gets together: Not on file   Attends religious service: Not on file    Active member of club or organization: Not on file    Attends meetings of clubs or organizations: Not on file    Relationship status: Not on file  . Intimate partner violence:    Fear of current or ex partner: Not on file    Emotionally abused: Not on file    Physically abused: Not on file    Forced sexual activity: Not on file  Other Topics Concern  . Not on file  Social History Narrative  . Not on file    ALLERGIES:    No Known Allergies  CURRENT MEDICATIONS:    Current Facility-Administered Medications  Medication Dose Route Frequency Provider Last Rate Last Dose  . acetaminophen (TYLENOL) tablet 650 mg  650 mg Oral Q6H PRN Ivor Costa, MD       Or  . acetaminophen (TYLENOL) suppository 650 mg  650 mg Rectal Q6H PRN Ivor Costa, MD      . darunavir-cobicistat (PREZCOBIX) 800-150 MG per tablet 1 tablet  1 tablet Oral Q breakfast Ivor Costa, MD      . dextromethorphan-guaiFENesin [facility] DM)  30-600 MG per 12 hr tablet 1 tablet  1 tablet Oral BID PRN Ivor Costa, MD      . diphenhydrAMINE (BENADRYL) capsule 25 mg  25 mg Oral Q6H PRN Ivor Costa, MD      . emtricitabine-tenofovir AF (DESCOVY) 200-25 MG per tablet 1 tablet  1 tablet Oral Daily Ivor Costa, MD   1 tablet at 07/20/17 1029  . heparin ADULT infusion 100 units/mL (25000 units/259mL sodium chloride 0.45%)  1,300 Units/hr Intravenous Continuous Ivor Costa, MD 13 mL/hr at 07/19/17 2306 1,300 Units/hr at 07/19/17 2306  . ondansetron (ZOFRAN) injection 4 mg  4 mg Intravenous Q8H PRN Ivor Costa, MD      . oxyCODONE-acetaminophen (PERCOCET/ROXICET) 5-325 MG per tablet 1 tablet  1 tablet Oral Q4H PRN Ivor Costa, MD      . pantoprazole (PROTONIX) EC tablet 40 mg  40 mg Oral BID Ivor Costa, MD   40 mg at 07/20/17 1029  . polyethylene glycol (MIRALAX / GLYCOLAX) packet 17 g  17 g Oral Daily PRN Ivor Costa, MD      . sulfamethoxazole-trimethoprim (BACTRIM DS,SEPTRA DS) 800-160 MG per tablet 1  tablet  1 tablet Oral Daily Ivor Costa, MD   1 tablet at 07/20/17 1030  . zolpidem (AMBIEN) tablet 5 mg  5 mg Oral QHS PRN Ivor Costa, MD        REVIEW OF SYSTEMS:   [X]  denotes positive finding, [ ]  denotes negative finding Cardiac  Comments:  Chest pain or chest pressure:    Shortness of breath upon exertion:    Short of breath when lying flat:    Irregular heart rhythm:        Vascular    Pain in calf, thigh, or hip brought on by ambulation:    Pain in feet at night that wakes you up from your sleep:     Blood clot in your veins:    Leg swelling:  x       Pulmonary    Oxygen at home:    Productive cough:     Wheezing:         Neurologic    Sudden weakness in arms or legs:     Sudden numbness in arms or legs:     Sudden onset of difficulty speaking or slurred speech:    Temporary loss of vision in one eye:     Problems with dizziness:         Gastrointestinal    Blood in stool:      Vomited blood:         Genitourinary    Burning when urinating:     Blood in urine:        Psychiatric    Major depression:         Hematologic    Bleeding problems:    Problems with blood clotting too easily:        Skin    Rashes or ulcers:        Constitutional    Fever or chills:     PHYSICAL EXAM:   Vitals:   07/20/17 0143 07/20/17 0302 07/20/17 0631 07/20/17 0842  BP: 120/62 117/83 118/69 (!) 124/104  Pulse: 91 86 84 85  Resp: 19 19 19 18   Temp: 98.8 F (37.1 C) 98 F (36.7 C) 98.9 F (37.2 C) 98 F (36.7 C)  TempSrc: Oral Oral Oral Oral  SpO2: 98% 95% 97% 99%  Weight:  157 lb 3 oz (71.3 kg)  GENERAL: The patient is a well-nourished male, in no acute distress. The vital signs are documented above. CARDIAC: There is a regular rate and rhythm.  VASCULAR: Markedly swollen right leg. PULMONARY: Nonlabored respirations  MUSCULOSKELETAL: There are no major deformities or cyanosis. NEUROLOGIC: No focal weakness or paresthesias are detected. SKIN: There are  no ulcers or rashes noted. PSYCHIATRIC: The patient has a normal affect.  STUDIES:   I have reviewed his ultrasound which shows acute DVT within the right common femoral vein femoral vein and popliteal vein.  There is also acute thrombus identified in the iliac vein on the right.  The vena cava was not well visualized.  There was also age-indeterminate left femoral vein and popliteal vein thrombus.  ASSESSMENT and PLAN   Acute right iliofemoral DVT.  I discussed with the patient the treatment options which would be either anticoagulation with leg elevation and compression, or mechanical/pharmacological thrombolyzes.  I discussed the risks and benefits of the procedure including the risk of bleeding which can be life-threatening.  We also discussed the risk of postphlebitic syndrome.  Currently I do not feel he has any contraindications for TPA.  We will try to proceed with overnight thrombolysis and follow-up thrombectomy beginning today extending into tomorrow.   Annamarie Major, MD Vascular and Vein Specialists of [facility] [phone] Pager [phone]

## 2017-07-20 NOTE — Progress Notes (Signed)
[facility] Progress Note  Patient: Juan Johnston OEU:[id number]   PCP: [name], PA-C DOB: 04-May-1950   DOA: 07/19/2017   DOS: 07/20/2017   Date of Service: the patient was seen and examined on 07/20/2017  Subjective: Has pain in his right leg as well as swelling.  No nausea no vomiting.  No chest pain abdominal pain.  No fever no chills.  No bleeding reported.  Brief hospital course: Pt. with PMH of HIV/AIDS, GERD, HLD; admitted on 07/19/2017, presented with complaint of right leg pain, was found to have right leg DVT. Currently further plan is supportive measures and monitor postoperative recovery.  Assessment and Plan: 1.  Extensive right leg DVT. Acute DVT noted in right common femoral, iliac, femoral vein, popliteal vein. Also right calf vein. Significant tightness as well as pain noted on examination. Superficial thrombosis also noted. Vascular surgery consulted who felt that the patient is an appropriate candidate for intervention. Underwent thrombolyzes and angiography. We will monitor postoperative recovery for the patient. Continue antic regulation with heparin. Patient is not a candidate for DOAC due to his HIV medication. Will require Coumadin bridging with Lovenox on discharge. Etiology appears to be provoked as the patient was recently in the urgent care where he had cyst removal from his scrotum 3 weeks ago per patient  2.  History of HIV-AIDS. CD4 100, viral load 497 on February 2019. Continuing home medication. On Bactrim prophylaxis as well.  3.  GERD. Protonix.  4.  Mild hyponatremia. Clinically not significant.  Monitor.  Diet: regular die  Advance goals of care discussion: full code  Family Communication: no family was present at bedside, at the time of interview.   Disposition:  Discharge to home.  Consultants: vascular surgery thrombolysis and follow-up thrombectomy Procedures: Angiography with  Antibiotics: Anti-infectives (From  admission, onward)   Start     Dose/Rate Route Frequency Ordered Stop   07/20/17 1200  darunavir-cobicistat (PREZCOBIX) 800-150 MG per tablet 1 tablet    Note to Pharmacy:  Swallow whole. Do NOT crush, break or chew tablets. Take with food.     1 tablet Oral Daily with breakfast 07/19/17 2319     07/20/17 1000  emtricitabine-tenofovir AF (DESCOVY) 200-25 MG per tablet 1 tablet     1 tablet Oral Daily 07/19/17 2319     07/20/17 1000  sulfamethoxazole-trimethoprim (BACTRIM DS,SEPTRA DS) 800-160 MG per tablet 1 tablet     1 tablet Oral Daily 07/19/17 2319         Objective: Physical Exam: Vitals:   07/20/17 1647 07/20/17 1652 07/20/17 1657 07/20/17 1702  BP:  (!) 141/82 131/82   Pulse: 83 87 85 75  Resp: 19 16 17 15   Temp:      TempSrc:      SpO2: 100% 99% 99% 99%  Weight:      Height:        Intake/Output Summary (Last 24 hours) at 07/20/2017 1728 Last data filed at 07/20/2017 1425 Gross per 24 hour  Intake 1225.82 ml  Output 225 ml  Net 1000.82 ml   Filed Weights   07/20/17 0302  Weight: 71.3 kg (157 lb 3 oz)   General: Alert, Awake and Oriented to Time, Place and Person. Appear in moderate distress, affect appropriate Eyes: PERRL, Conjunctiva normal ENT: Oral Mucosa clear moist. Neck: no JVD, no Abnormal Mass Or lumps Cardiovascular: S1 and S2 Present, no Murmur, Peripheral Pulses Present Respiratory: normal respiratory effort, Bilateral Air entry equal and Decreased, no use  of accessory muscle Clear to Auscultation, no Crackles, no wheezes Abdomen: Bowel Sound present, Soft and no tenderness, no hernia Skin: no redness, no Rash, no induration Extremities: right Pedal edema, right calf tenderness Neurologic: Grossly no focal neuro deficit. Bilaterally Equal motor strength  Data Reviewed: CBC: Recent Labs  Lab 07/19/17 1700 07/20/17 0449  WBC 6.1 6.6  NEUTROABS 4.2  --   HGB 14.7 13.3  HCT 43.9 40.4  MCV 96.1 95.5  PLT 163 808   Basic Metabolic Panel: Recent  Labs  Lab 07/19/17 1700 07/20/17 0449  NA 138 134*  K 4.1 3.8  CL 103 100*  CO2 26 22  GLUCOSE 127* 170*  BUN 8 9  CREATININE 0.96 0.99  CALCIUM 9.8 9.4    Liver Function Tests: No results for input(s): AST, ALT, ALKPHOS, BILITOT, PROT, ALBUMIN in the last 168 hours. No results for input(s): LIPASE, AMYLASE in the last 168 hours. No results for input(s): AMMONIA in the last 168 hours. Coagulation Profile: No results for input(s): INR, PROTIME in the last 168 hours. Cardiac Enzymes: No results for input(s): CKTOTAL, CKMB, CKMBINDEX, TROPONINI in the last 168 hours. BNP (last 3 results) No results for input(s): PROBNP in the last 8760 hours. CBG: No results for input(s): GLUCAP in the last 168 hours. Studies: No results found.  Scheduled Meds: . darunavir-cobicistat  1 tablet Oral Q breakfast  . emtricitabine-tenofovir AF  1 tablet Oral Daily  . pantoprazole  40 mg Oral BID  . sodium chloride flush  3 mL Intravenous Q12H  . sulfamethoxazole-trimethoprim  1 tablet Oral Daily   Continuous Infusions: . sodium chloride 100 mL/hr at 07/20/17 1339  . sodium chloride    . alteplase (LIMB ISCHEMIA) 10 mg in normal saline (0.02 mg/mL) infusion 0.5 mg/hr (07/20/17 1701)  . heparin 1,300 Units/hr (07/20/17 1658)  . heparin     PRN Meds: sodium chloride, acetaminophen **OR** acetaminophen, dextromethorphan-guaiFENesin, diphenhydrAMINE, midazolam, morphine injection, ondansetron (ZOFRAN) IV, oxyCODONE-acetaminophen, polyethylene glycol, sodium chloride flush, zolpidem  Time spent: 35 minutes  Author: Berle Mull, MD [facility] Pager: [phone] 07/20/2017 5:28 PM  If 7PM-7AM, please contact night-coverage at [email or web address], password [facility]

## 2017-07-20 NOTE — Progress Notes (Signed)
Patient arrived to 3E10 from [facility](5c). A&O x 4. No complaints of pain or discomfort. VSS. SR on telemetry. No skin breakdown. 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 Patient oriented to room and call system.

## 2017-07-20 NOTE — Progress Notes (Signed)
Patient received from vascular lab, sheath site in right popliteal vein. Heparin and TPA infusing per order, however no documentation on sheath placement prior to arrival. LDA placed.

## 2017-07-20 NOTE — Op Note (Signed)
Patient name: Juan Johnston MRN: [id number] DOB: [birth year] Sex: male  07/20/2017 Pre-operative Diagnosis: DVT Post-operative diagnosis:  Same Surgeon:  Annamarie Major Procedure Performed:  1.  Ultrasound-guided access, right popliteal vein  2.  Right leg venogram  3.  Intravascular ultrasound (IVUS) right popliteal vein, femoral vein, common femoral vein, external iliac vein, common iliac vein, inferior vena cava  4.  Placement of a 50 cm UniFuse catheter for infusion of TPA  5.  Conscious sedation (greater than 15 minutes)     Indications: The patient presented to the emergency department today with a 3-day history of right leg pain.  Duplex ultrasound was performed which showed DVT which was acute in the right iliac vein down through the common femoral, femoral, popliteal, and tibial veins.  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  Because of the patient's severely swollen right leg and discomfort, I felt he would benefit from an attempt at thrombolysis and mechanical thrombectomy.  I discussed the procedure in detail with the patient as well as the options of anticoagulation alone.  We decided to proceed with angiography and intervention.  Procedure:  The patient was identified in the holding area and taken to room 8.  The patient was then placed supine on the table and prepped and draped in the usual sterile fashion.  Conscious sedation was administered with the use of IV fentanyl and Versed under continuous physician and nurse monitoring.  Heart rate, blood pressure, and oxygen saturations were continuously monitored.  A time out was called.  Ultrasound was used to evaluate the right popliteal vein.  It was difficult to evaluate because of the edema within the tissue.  I attempted multiple times to cannulate the popliteal vein which were unsuccessful.  I then prepped the leg out more distal and identified the small saphenous vein as it entered the deep system.  It  appeared to be patent.  I then cannulated the small saphenous vein under ultrasound guidance with a micropuncture needle.  The wire advanced easily and a micropuncture sheath was placed.  I then inserted the Bentson wire followed by a 5 [place] sheath.  I used a Berenstein 2 catheter and a Glidewire to get access into the vena cava.  There was a small area where I had difficulty crossing in the femoral vein and potentially could have passed the wire outside of the vein but it was difficult to tell.  Once I had access into the vena cava, a Amplatz superstiff wire was inserted up into the axillary vein.  While trying to navigate the wire through the femoral venous system, I did perform a hand injected contrast run which confirmed that I was in the femoral vein.  There was significant thrombus within the vein.  Intravascular ultrasound was then used to evaluate the popliteal, femoral, common femoral, external iliac vein, common iliac vein, and inferior vena cava.  The patient had a significant thrombus burden throughout, extending up to at least the level of the renal veins.  Ultrasound images raise the question that this had a chronic component to it.  I did not feel a filter should be placed because the thrombus went up to the level and may be even higher of the renal vein.  At this point, I elected to place a 50 cm UniFuse catheter for overnight administration of TPA.  The patient tolerated the procedure well and there were no immediate complications.  Findings:    right  leg venogram: Extensive clot within the femoral vein   IVUS:   Occlusive thrombus within the popliteal, femoral, common femoral, external iliac vein.  There did appear to be nonocclusive thrombus in the common iliac vein.  The thrombus extended up into the inferior vena caval to at least the level of the renal veins.    Impression:  #1  Extensive clot within the right leg from the popliteal vein up into the inferior vena cava.  There was  definitely a chronic component.  Because of the thrombus within the inferior vena cava, no percutaneous intervention was indicated at this time and therefore a UniFuse catheter was left in place for overnight administration of TPA  #2  The patient will be brought back tomorrow for a follow-up check and possible intervention.     Theotis Burrow, M.D. [facility] Office: [phone] Pager:  [phone]

## 2017-07-20 NOTE — Progress Notes (Signed)
ANTICOAGULATION CONSULT NOTE   Pharmacy Consult for heparin Indication: DVT  No Known Allergies  Patient Measurements: Height: 5\' 7"  (170.2 cm) Weight: 157 lb 3 oz (71.3 kg) IBW/kg (Calculated) : 66.1 Heparin Dosing Weight: 72 kg  Vital Signs: Temp: 98.3 F (36.8 C) (04/17 2048) Temp Source: Axillary (04/17 2048) BP: 111/69 (04/17 2000) Pulse Rate: 85 (04/17 2000)  Labs: Recent Labs    07/19/17 1700 07/20/17 0449 07/20/17 1159 07/20/17 1928  HGB 14.7 13.3  --  12.9*  HCT 43.9 40.4  --  39.1  PLT 163 150  --  153  HEPARINUNFRC  --  0.67 0.59 <0.10*  CREATININE 0.96 0.99  --   --     Estimated Creatinine Clearance: 68.6 mL/min (by C-G formula based on SCr of 0.99 mg/dL).     Assessment: 67 yo presented to ED for RLE swelling with suspected DVT.  Now s/p thrombolysis with ongoing TPA Heparin level < 0.10 this AM, CBC stable  Goal of Therapy:  Heparin level 0.2 to 0.5 while on TPA Monitor platelets by anticoagulation protocol: Yes   Plan:  Increase heparin to 1100 units / hr 6 hour heparin level, CBC, fibrinogen  Thank you Anette Guarneri, PharmD [phone]  07/20/2017,9:07 PM

## 2017-07-20 NOTE — Progress Notes (Signed)
ANTICOAGULATION CONSULT NOTE - Follow Up Consult  Pharmacy Consult for Heparin Indication: DVT, right leg  No Known Allergies  Patient Measurements: Height: 5\' 8"  (172.7 cm) Weight: 157 lb 3 oz (71.3 kg) IBW/kg (Calculated) : 68.4 Heparin Dosing Weight: 71 kg  Vital Signs: Temp: 98 F (36.7 C) (04/17 0842) Temp Source: Oral (04/17 0842) BP: 124/104 (04/17 0842) Pulse Rate: 85 (04/17 0842)  Labs: Recent Labs    07/19/17 1700 07/20/17 0449 07/20/17 1159  HGB 14.7 13.3  --   HCT 43.9 40.4  --   PLT 163 150  --   HEPARINUNFRC  --  0.67 0.59  CREATININE 0.96 0.99  --     Estimated Creatinine Clearance: 71 mL/min (by C-G formula based on SCr of 0.99 mg/dL).  Assessment:  67 yr old male continues on IV heparin for right leg DVT.    Heparin level remains therapeutic (0.59) on 1300 units/hr.    VVS consulted. Noted plans for thrombolysis later today.  Goal of Therapy:  Heparin level 0.3-0.7 units/ml Monitor platelets by anticoagulation protocol: Yes   Plan:   Continue heparin drip at 1300 units/hr.  Daily heparin level and CBC while on heparin.  Follow up for thrombolysis and post-procedure plans.  Arty Baumgartner, [place] Pager: [phone] 07/20/2017,12:42 PM

## 2017-07-20 NOTE — Progress Notes (Signed)
ANTICOAGULATION CONSULT NOTE   Pharmacy Consult for heparin Indication: DVT  No Known Allergies  Patient Measurements: Weight: 157 lb 3 oz (71.3 kg) Heparin Dosing Weight: 72 kg  Vital Signs: Temp: 98 F (36.7 C) (04/17 0302) Temp Source: Oral (04/17 0302) BP: 117/83 (04/17 0302) Pulse Rate: 86 (04/17 0302)  Labs: Recent Labs    07/19/17 1700 07/20/17 0449  HGB 14.7 13.3  HCT 43.9 40.4  PLT 163 150  HEPARINUNFRC  --  0.67  CREATININE 0.96 0.99    Estimated Creatinine Clearance: 71 mL/min (by C-G formula based on SCr of 0.99 mg/dL).   Medical History: Past Medical History:  Diagnosis Date  . HIV infection ([place])   . Thyroid goiter     Medications:  Scheduled:  . darunavir-cobicistat  1 tablet Oral Q breakfast  . emtricitabine-tenofovir AF  1 tablet Oral Daily  . pantoprazole  40 mg Oral BID  . sulfamethoxazole-trimethoprim  1 tablet Oral Daily    Assessment: 67 yo presented to ED for RLE swelling with suspected DVT. Pharmacy consulted for heparin dosing. No anticoagulation prior to admit. No signs of bleeding. Initial heparin level is therapeutic  Goal of Therapy:  Heparin level 0.3-0.7 units/ml Monitor platelets by anticoagulation protocol: Yes   Plan:  Continue heparin drip at 1300 units/hr Check heparin level later today to confirm  Excell Seltzer Poteet 07/20/2017,6:20 AM

## 2017-07-21 ENCOUNTER — Ambulatory Visit (HOSPITAL_COMMUNITY): Admission: RE | Admit: 2017-07-21 | Payer: Self-pay | Source: Ambulatory Visit | Admitting: Vascular Surgery

## 2017-07-21 ENCOUNTER — Other Ambulatory Visit: Payer: Self-pay

## 2017-07-21 ENCOUNTER — Inpatient Hospital Stay (HOSPITAL_COMMUNITY): Payer: Medicare Other

## 2017-07-21 ENCOUNTER — Encounter (HOSPITAL_COMMUNITY)
Admission: EM | Disposition: A | Discharge status: Home / Self Care | Payer: Self-pay | Source: Home / Self Care | Attending: Internal Medicine

## 2017-07-21 DIAGNOSIS — I824Y1 Acute embolism and thrombosis of unspecified deep veins of right proximal lower extremity: Secondary | ICD-10-CM

## 2017-07-21 DIAGNOSIS — R509 Fever, unspecified: Secondary | ICD-10-CM

## 2017-07-21 HISTORY — PX: PERIPHERAL VASCULAR THROMBECTOMY: CATH118306

## 2017-07-21 HISTORY — PX: INTRAVASCULAR ULTRASOUND/IVUS: CATH118244

## 2017-07-21 LAB — CBC
HCT: 38.2 % — ABNORMAL LOW (ref 39.0–52.0)
HEMATOCRIT: 38 % — AB (ref 39.0–52.0)
HEMOGLOBIN: 12.6 g/dL — AB (ref 13.0–17.0)
Hemoglobin: 12.6 g/dL — ABNORMAL LOW (ref 13.0–17.0)
MCH: 31.2 pg (ref 26.0–34.0)
MCH: 31.5 pg (ref 26.0–34.0)
MCHC: 33 g/dL (ref 30.0–36.0)
MCHC: 33.2 g/dL (ref 30.0–36.0)
MCV: 94.6 fL (ref 78.0–100.0)
MCV: 95 fL (ref 78.0–100.0)
PLATELETS: 144 10*3/uL — AB (ref 150–400)
Platelets: 147 10*3/uL — ABNORMAL LOW (ref 150–400)
RBC: 4.04 MIL/uL — ABNORMAL LOW (ref 4.22–5.81)
RBC: 4 MIL/uL — ABNORMAL LOW (ref 4.22–5.81)
RDW: 14 % (ref 11.5–15.5)
RDW: 14 % (ref 11.5–15.5)
WBC: 6.8 10*3/uL (ref 4.0–10.5)
WBC: 6.6 10*3/uL (ref 4.0–10.5)

## 2017-07-21 LAB — BASIC METABOLIC PANEL
ANION GAP: 8 (ref 5–15)
BUN: 9 mg/dL (ref 6–20)
CALCIUM: 8.7 mg/dL — AB (ref 8.9–10.3)
CO2: 24 mmol/L (ref 22–32)
Chloride: 101 mmol/L (ref 101–111)
Creatinine, Ser: 1.03 mg/dL (ref 0.61–1.24)
GFR calc Af Amer: >60 mL/min (ref >60)
GFR calc non Af Amer: >60 mL/min (ref >60)
GLUCOSE: 125 mg/dL — AB (ref 65–99)
POTASSIUM: 4.1 mmol/L (ref 3.5–5.1)
Sodium: 133 mmol/L — ABNORMAL LOW (ref 135–145)

## 2017-07-21 LAB — URINALYSIS, ROUTINE W REFLEX MICROSCOPIC
BILIRUBIN URINE: NEGATIVE
Glucose, UA: NEGATIVE mg/dL
HGB URINE DIPSTICK: NEGATIVE
KETONES UR: NEGATIVE mg/dL
Leukocytes, UA: NEGATIVE
NITRITE: NEGATIVE
Protein, ur: NEGATIVE mg/dL
SPECIFIC GRAVITY, URINE: 1.01 (ref 1.005–1.030)
pH: 5 (ref 5.0–8.0)

## 2017-07-21 LAB — MAGNESIUM: Magnesium: 1.8 mg/dL (ref 1.7–2.4)

## 2017-07-21 LAB — FIBRINOGEN
FIBRINOGEN: 575 mg/dL — AB (ref 210–475)
FIBRINOGEN: 626 mg/dL — AB (ref 210–475)

## 2017-07-21 LAB — HEPARIN LEVEL (UNFRACTIONATED)
HEPARIN UNFRACTIONATED: 0.33 [IU]/mL (ref 0.30–0.70)
Heparin Unfractionated: 0.34 IU/mL (ref 0.30–0.70)

## 2017-07-21 SURGERY — PERIPHERAL VASCULAR THROMBECTOMY
Anesthesia: LOCAL

## 2017-07-21 MED ORDER — SODIUM CHLORIDE 0.9 % IV BOLUS
1000.0000 mL | Freq: Once | INTRAVENOUS | Status: AC
  Administered 2017-07-21: 1000 mL via INTRAVENOUS

## 2017-07-21 MED ORDER — HEPARIN (PORCINE) IN NACL 1000-0.9 UT/500ML-% IV SOLN
INTRAVENOUS | Status: AC
  Filled 2017-07-21: qty 500

## 2017-07-21 MED ORDER — FENTANYL CITRATE (PF) 100 MCG/2ML IJ SOLN
INTRAMUSCULAR | Status: AC
  Filled 2017-07-21: qty 2

## 2017-07-21 MED ORDER — HEPARIN SODIUM (PORCINE) 1000 UNIT/ML IJ SOLN
INTRAMUSCULAR | Status: AC
  Filled 2017-07-21: qty 1

## 2017-07-21 MED ORDER — FENTANYL CITRATE (PF) 100 MCG/2ML IJ SOLN
INTRAMUSCULAR | Status: DC | PRN
  Administered 2017-07-21 (×2): 50 ug via INTRAVENOUS

## 2017-07-21 MED ORDER — SODIUM CHLORIDE 0.9 % IV SOLN: INTRAVENOUS | Status: AC

## 2017-07-21 MED ORDER — HEPARIN SODIUM (PORCINE) 1000 UNIT/ML IJ SOLN
INTRAMUSCULAR | Status: DC | PRN
  Administered 2017-07-21 (×2): 4000 [IU] via INTRAVENOUS

## 2017-07-21 MED ORDER — ASPIRIN 81 MG PO CHEW
81.0000 mg | CHEWABLE_TABLET | Freq: Every day | ORAL | Status: DC
  Administered 2017-07-22 – 2017-07-23 (×2): 81 mg via ORAL
  Filled 2017-07-21 (×2): qty 1

## 2017-07-21 MED ORDER — LIDOCAINE HCL (PF) 1 % IJ SOLN
INTRAMUSCULAR | Status: AC
  Filled 2017-07-21: qty 30

## 2017-07-21 MED ORDER — HEPARIN (PORCINE) IN NACL 2-0.9 UNITS/ML
INTRAMUSCULAR | Status: DC | PRN
  Administered 2017-07-21: 500 mL via INTRA_ARTERIAL

## 2017-07-21 MED ORDER — HEPARIN (PORCINE) IN NACL 100-0.45 UNIT/ML-% IJ SOLN
1200.0000 [IU]/h | INTRAMUSCULAR | Status: DC
  Administered 2017-07-21 – 2017-07-22 (×2): 1200 [IU]/h via INTRAVENOUS
  Filled 2017-07-21 (×3): qty 250

## 2017-07-21 MED ORDER — IODIXANOL 320 MG/ML IV SOLN
INTRAVENOUS | Status: DC | PRN
  Administered 2017-07-21: 40 mL via INTRAVENOUS

## 2017-07-21 SURGICAL SUPPLY — 12 items
BAG SNAP BAND KOVER 36X36 (MISCELLANEOUS) ×2 IMPLANT
BALLN MUSTANG 8X60X75 (BALLOONS) ×2
BALLOON MUSTANG 8X60X75 (BALLOONS) ×1 IMPLANT
CATH RETRIEVER CLOT 16MMX105CM (CATHETERS) ×2 IMPLANT
CATH VISIONS PV .035 IVUS (CATHETERS) ×2 IMPLANT
COVER DOME SNAP 22 D (MISCELLANEOUS) ×2 IMPLANT
KIT ENCORE 26 ADVANTAGE (KITS) ×2 IMPLANT
PROTECTION STATION PRESSURIZED (MISCELLANEOUS) ×2
SHEATH CLOT RETRIEVER (SHEATH) ×2 IMPLANT
STATION PROTECTION PRESSURIZED (MISCELLANEOUS) ×1 IMPLANT
TRAY PV CATH (CUSTOM PROCEDURE TRAY) ×2 IMPLANT
WIRE AMPLATZ SS-J .035X260CM (WIRE) ×2 IMPLANT

## 2017-07-21 NOTE — Progress Notes (Signed)
ANTICOAGULATION CONSULT NOTE - Follow Up Consult  Pharmacy Consult for heparin Indication: DVT  Labs: Recent Labs    07/19/17 1700  07/20/17 0449 07/20/17 1159 07/20/17 1928 07/21/17 0252 07/21/17 0255  HGB 14.7  --  13.3  --  12.9* 12.6*  --   HCT 43.9  --  40.4  --  39.1 38.2*  --   PLT 163  --  150  --  153 144*  --   HEPARINUNFRC  --    < > 0.67 0.59 <0.10* 0.33  --   CREATININE 0.96  --  0.99  --   --   --  1.03   < > = values in this interval not displayed.    Assessment/Plan:  67yo male therapeutic on heparin after rate change. Will continue gtt at current rate and confirm stable with additional level.   Wynona Neat, PharmD, BCPS  07/21/2017,4:42 AM

## 2017-07-21 NOTE — Op Note (Signed)
    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 Donzetta Matters, MD Assistant: Gae Gallop, MD Procedure Performed: 1.  Intravascular ultrasound IVC, right common and external iliac veins, right common femoral and femoral veins and right popliteal vein 2.  Mechanical thrombectomy of IVC, right common and external iliac veins, right common femoral and femoral veins and right popliteal vein with Inari Clottriever 3.  Right lower extremity venogram  Indications: 67yo male with chronic thrombus in his bilateral lower extremities and now an acute exacerbation with acute clot in his right lower extremity.  He had undergone clot lysis the night prior to this and is now indicated for lytic recheck possible intervention.  Findings:  initially on recheck by IVUS there was a very narrow channel through the femoral vein on the right and the common femoral vein still appeared occluded as did the external and common iliac veins.  There was chronic appearing thrombus extending into the inferior vena cava to the level of L1.  I completion the inferior vena cava was free of any acute or chronic thrombus in the right common iliac vein had only minimal appearing chronic thrombus with the external and common femoral veins appearing only with chronic appearing scarring of the wall but no acute clot at all.  The saphenofemoral junction and profunda femoris did appear patent.  The femoral vein still appears to have very minimal channel.  By venogram there is likely a vein of Giacomini  filling the mid femoral vein and the common femoral vein appears patent with flow into the IVC through main channel.  No balloon angioplasty was undertaken 67 year old male with   Procedure:  The patient was identified in the holding area and taken to the [place] lab where he  was placed prone on the table and his right popliteal fossa was sterilely prepped and draped with existing sheath and catheter.  We exchanged for Amplatz wire through the catheter placed this into his innominate vein on the right.  We then performed obvious from his popliteal vein all the way up through his IVC.  With the above findings we elected to use the Clottriever device. We exchanged for the 16 French sheath over Amplatz wire and deployed the sheath netting.  This was then balloon angioplasty with 8 mm balloon.  We then placed the clot trigger into the IVC deployed and perform a mechanical thrombectomy from there to the popliteal vein for a total of 6 passes.  We then performed IVIS with the above findings with now what appeared to be a channel particularly a large channel from the common femoral vein through the IVC there is no further IVC chronic or acute appearing thrombus.  We then performed venogram.  Satisfied with all this we elected not to balloon angioplasty or stent.  We then removed the sheath and wire and pressure was held.  He tolerated procedure well without immediate complication.   Contrast: 40cc   Kalani Sthilaire C. Donzetta Matters, MD [facility] [place] Office: [phone] Pager: [phone]

## 2017-07-21 NOTE — Progress Notes (Signed)
ANTICOAGULATION CONSULT NOTE - Follow Up Consult  Pharmacy Consult for Heparin Indication: DVT, right leg  No Known Allergies  Patient Measurements: Height: 5\' 7"  (170.2 cm) Weight: 157 lb 3 oz (71.3 kg) IBW/kg (Calculated) : 66.1 Heparin Dosing Weight: 71 kg  Vital Signs: Temp: 98 F (36.7 C) (04/18 0800) Temp Source: Oral (04/18 0800) BP: 115/64 (04/18 0800) Pulse Rate: 81 (04/18 0800)  Labs: Recent Labs    07/19/17 1700 07/20/17 0449  07/20/17 1928 07/21/17 0252 07/21/17 0255 07/21/17 0827  HGB 14.7 13.3  --  12.9* 12.6*  --  12.6*  HCT 43.9 40.4  --  39.1 38.2*  --  38.0*  PLT 163 150  --  153 144*  --  147*  HEPARINUNFRC  --  0.67   < > <0.10* 0.33  --  0.34  CREATININE 0.96 0.99  --   --   --  1.03  --    < > = values in this interval not displayed.    Estimated Creatinine Clearance: 66 mL/min (by C-G formula based on SCr of 1.03 mg/dL).  Assessment:  67 yr old male continues on IV alteplase and IV heparin for right leg DVT. S/p thrombectomy yesterday with plans to return to IR for recheck this afternoon   Heparin level remains therapeutic (0.39) on 1100 units/hr.  Goal of Therapy:  Heparin level 0.2-0.5 units/ml Monitor platelets by anticoagulation protocol: Yes   Plan:  Continue heparin drip at 1300 units/hr. Daily heparin level and CBC while on heparin. F/u anticoag plans post IR today   Albertina Parr, PharmD., BCPS Clinical Pharmacist Clinical phone for 07/21/17 until 3:30pm: [phone] If after 3:30pm, please call main pharmacy at: [phone]

## 2017-07-21 NOTE — Progress Notes (Signed)
    Subjective  - POD #1  No acute events Says right leg feels a little better   Physical Exam:  Right leg edema.  No signsof bleeding from TPA       Assessment/Plan:  POD #1  Plan for lysis re-check today. 1 L bolus for renal protection If has residual IVC thrombus, he will be anticoagulated for a few weeks and then re-evaluated for angio-vac  Juan Johnston 07/21/2017 8:21 AM --  Vitals:   07/21/17 0700 07/21/17 0800  BP: 118/63 115/64  Pulse: 85 81  Resp: (!) 22 (!) 24  Temp:  98 F (36.7 C)  SpO2: 94% 97%    Intake/Output Summary (Last 24 hours) at 07/21/2017 0821 Last data filed at 07/21/2017 0800 Gross per 24 hour  Intake 3249.88 ml  Output 575 ml  Net 2674.88 ml     Laboratory CBC    Component Value Date/Time   WBC 6.8 07/21/2017 0252   HGB 12.6 (L) 07/21/2017 0252   HGB 11.3 (L) 03/07/2017 1508   HCT 38.2 (L) 07/21/2017 0252   HCT 35.1 (L) 03/07/2017 1508   PLT 144 (L) 07/21/2017 0252   PLT 235 03/07/2017 1508    BMET    Component Value Date/Time   NA 133 (L) 07/21/2017 0255   K 4.1 07/21/2017 0255   CL 101 07/21/2017 0255   CO2 24 07/21/2017 0255   GLUCOSE 125 (H) 07/21/2017 0255   BUN 9 07/21/2017 0255   CREATININE 1.03 07/21/2017 0255   CREATININE 1.02 05/19/2017 1422   CALCIUM 8.7 (L) 07/21/2017 0255   GFRNONAA >60 07/21/2017 0255   GFRNONAA 76 05/19/2017 1422   GFRAA >60 07/21/2017 0255   GFRAA 88 05/19/2017 1422    COAG No results found for: INR, PROTIME No results found for: PTT  Antibiotics Anti-infectives (From admission, onward)   Start     Dose/Rate Route Frequency Ordered Stop   07/20/17 1200  darunavir-cobicistat (PREZCOBIX) 800-150 MG per tablet 1 tablet    Note to Pharmacy:  Swallow whole. Do NOT crush, break or chew tablets. Take with food.     1 tablet Oral Daily with breakfast 07/19/17 2319     07/20/17 1000  emtricitabine-tenofovir AF (DESCOVY) 200-25 MG per tablet 1 tablet     1 tablet Oral Daily 07/19/17  2319     07/20/17 1000  sulfamethoxazole-trimethoprim (BACTRIM DS,SEPTRA DS) 800-160 MG per tablet 1 tablet     1 tablet Oral Daily 07/19/17 2319         V. Leia Alf, M.D. [facility] [place] Office: [phone] Pager:  [phone]

## 2017-07-21 NOTE — Progress Notes (Signed)
PROGRESS NOTE  Juan Johnston Juan Johnston:[id number] DOB: 1950/12/11 DOA: 07/19/2017 PCP: [name], PA-C   LOS: 1 day   Brief Narrative / Interim history: 67 year old male with history of HIV/AIDS, hyperlipidemia, GERD, was admitted on 4/16 with right leg pain and was found to have extensive right leg DVT.  Vascular surgery was consulted and patient receiving lysis/TPA  Assessment & Plan: Principal Problem:   DVT (deep venous thrombosis) (HCC) Active Problems:   AIDS (acquired immune deficiency syndrome) (HCC)   HIV (human immunodeficiency virus infection) ([place])   GERD (gastroesophageal reflux disease)   DVT of leg (deep venous thrombosis) (HCC)   Extensive right leg DVT -DVT noted in the common femoral, iliac, femoral vein and popliteal vein, right calf pain -Vascular surgery consulted, undergoing lysis angiography -Not a candidate for DO AC due to his HIV medication, will require Coumadin and Lovenox -DVT seems to be provoked as he is post scrotal cyst removal 3 weeks ago  History of HIV/AIDS -CD4 100, viral load 497 in February 2019, continue home medications, continue prophylaxis  Fever -Likely related to his DVT, no systemic symptoms, given history of HIV obtain blood cultures, chest x-ray, urinalysis and closely monitor for infectious source.  Hold antibiotics for now  GERD -Continue Protonix   DVT prophylaxis: heparin Code Status: Full code Family Communication: no family at bedside Disposition Plan: home when ready   Consultants:   Vascular surgery   Procedures:   None  Antimicrobials:  None    Subjective: - no chest pain, shortness of breath, no abdominal pain, nausea or vomiting.   Objective: Vitals:   07/21/17 0900 07/21/17 1000 07/21/17 1100 07/21/17 1209  BP: 109/70 113/67 105/66   Pulse: 82 85 80   Resp: 19 19 (!) 22   Temp:      TempSrc:      SpO2: 96% 96% 98% 100%  Weight:      Height:        Intake/Output Summary (Last 24 hours)  at 07/21/2017 1243 Last data filed at 07/21/2017 1145 Gross per 24 hour  Intake 3497.88 ml  Output 1150 ml  Net 2347.88 ml   Filed Weights   07/20/17 0302  Weight: 71.3 kg (157 lb 3 oz)    Examination:  Constitutional: NAD Eyes: lids and conjunctivae normal ENMT: Mucous membranes are moist.  Respiratory: clear to auscultation bilaterally, no wheezing, no crackles. Normal respiratory effort. No accessory muscle use.  Cardiovascular: Regular rate and rhythm, no murmurs / rubs / gallops. RLE edema.  Abdomen: no tenderness. Bowel sounds positive.  Skin: no rashes Neurologic: CN 2-12 grossly intact. Strength 5/5 in all 4.  Psychiatric: Normal judgment and insight. Alert and oriented x 3. Normal mood.    Data Reviewed: I have independently reviewed following labs and imaging studies   CBC: Recent Labs  Lab 07/19/17 1700 07/20/17 0449 07/20/17 1928 07/21/17 0252 07/21/17 0827  WBC 6.1 6.6 7.4 6.8 6.6  NEUTROABS 4.2  --   --   --   --   HGB 14.7 13.3 12.9* 12.6* 12.6*  HCT 43.9 40.4 39.1 38.2* 38.0*  MCV 96.1 95.5 94.9 94.6 95.0  PLT 163 150 153 144* 314*   Basic Metabolic Panel: Recent Labs  Lab 07/19/17 1700 07/20/17 0449 07/21/17 0255  NA 138 134* 133*  K 4.1 3.8 4.1  CL 103 100* 101  CO2 26 22 24   GLUCOSE 127* 170* 125*  BUN 8 9 9   CREATININE 0.96 0.99 1.03  CALCIUM 9.8  9.4 8.7*  MG  --   --  1.8   GFR: Estimated Creatinine Clearance: 66 mL/min (by C-G formula based on SCr of 1.03 mg/dL). Liver Function Tests: No results for input(s): AST, ALT, ALKPHOS, BILITOT, PROT, ALBUMIN in the last 168 hours. No results for input(s): LIPASE, AMYLASE in the last 168 hours. No results for input(s): AMMONIA in the last 168 hours. Coagulation Profile: No results for input(s): INR, PROTIME in the last 168 hours. Cardiac Enzymes: No results for input(s): CKTOTAL, CKMB, CKMBINDEX, TROPONINI in the last 168 hours. BNP (last 3 results) No results for input(s): PROBNP in the  last 8760 hours. HbA1C: No results for input(s): HGBA1C in the last 72 hours. CBG: No results for input(s): GLUCAP in the last 168 hours. Lipid Profile: No results for input(s): CHOL, HDL, LDLCALC, TRIG, CHOLHDL, LDLDIRECT in the last 72 hours. Thyroid Function Tests: No results for input(s): TSH, T4TOTAL, FREET4, T3FREE, THYROIDAB in the last 72 hours. Anemia Panel: No results for input(s): VITAMINB12, FOLATE, FERRITIN, TIBC, IRON, RETICCTPCT in the last 72 hours. Urine analysis:    Component Value Date/Time   COLORURINE YELLOW 07/21/2017 1051   APPEARANCEUR CLEAR 07/21/2017 1051   LABSPEC 1.010 07/21/2017 1051   PHURINE 5.0 07/21/2017 1051   GLUCOSEU NEGATIVE 07/21/2017 1051   HGBUR NEGATIVE 07/21/2017 [place] 07/21/2017 [place] 07/21/2017 1051   PROTEINUR NEGATIVE 07/21/2017 1051   NITRITE NEGATIVE 07/21/2017 1051   LEUKOCYTESUR NEGATIVE 07/21/2017 1051   Sepsis Labs: Invalid input(s): PROCALCITONIN, LACTICIDVEN  Recent Results (from the past 240 hour(s))  MRSA PCR Screening     Status: None   Collection Time: 07/20/17  5:34 PM  Result Value Ref Range Status   MRSA by PCR NEGATIVE NEGATIVE Final    Comment:        The GeneXpert MRSA Assay (FDA approved for NASAL specimens only), is one component of a comprehensive MRSA colonization surveillance program. It is not intended to diagnose MRSA infection nor to guide or monitor treatment for MRSA infections. Performed at [facility], [place] [address], [place], [place] [address]       Radiology Studies: Dg Chest Port 1 View  Result Date: 07/21/2017 CLINICAL DATA:  Fever. EXAM: PORTABLE CHEST 1 VIEW COMPARISON:  Radiographs of February 26, 2017. FINDINGS: The heart size and mediastinal contours are within normal limits. No pneumothorax or pleural effusion is noted. Right lung is clear. Stable left basilar scarring with associated pleural thickening is noted. No acute  abnormality is noted. The visualized skeletal structures are unremarkable. IMPRESSION: Stable left basilar scarring with associated pleural thickening. No acute abnormality seen. Electronically Signed   By: Marijo Conception, M.D.   On: 07/21/2017 09:25     Scheduled Meds: . [MAR Hold] darunavir-cobicistat  1 tablet Oral Q breakfast  . [MAR Hold] emtricitabine-tenofovir AF  1 tablet Oral Daily  . [MAR Hold] pantoprazole  40 mg Oral BID  . [MAR Hold] sodium chloride flush  3 mL Intravenous Q12H  . [MAR Hold] sulfamethoxazole-trimethoprim  1 tablet Oral Daily   Continuous Infusions: . sodium chloride 100 mL/hr at 07/21/17 1100  . [MAR Hold] sodium chloride    . alteplase (LIMB ISCHEMIA) 10 mg in normal saline (0.02 mg/mL) infusion Stopped (07/21/17 1219)  . heparin 1,100 Units/hr (07/21/17 1100)  . heparin Stopped (07/21/17 1217)  . sodium chloride 250 mL/hr at 07/21/17 [place], MD, PhD [facility] Pager [phone]  If 7PM-7AM, please contact night-coverage [email or web address] Password [email or web address] 07/21/2017, 12:43 PM

## 2017-07-22 ENCOUNTER — Encounter (HOSPITAL_COMMUNITY): Payer: Self-pay | Admitting: Surgery

## 2017-07-22 DIAGNOSIS — K219 Gastro-esophageal reflux disease without esophagitis: Secondary | ICD-10-CM

## 2017-07-22 LAB — CBC
HCT: 35.5 % — ABNORMAL LOW (ref 39.0–52.0)
Hemoglobin: 11.9 g/dL — ABNORMAL LOW (ref 13.0–17.0)
MCH: 31.3 pg (ref 26.0–34.0)
MCHC: 33.5 g/dL (ref 30.0–36.0)
MCV: 93.4 fL (ref 78.0–100.0)
Platelets: 138 10*3/uL — ABNORMAL LOW (ref 150–400)
RBC: 3.8 MIL/uL — ABNORMAL LOW (ref 4.22–5.81)
RDW: 14 % (ref 11.5–15.5)
WBC: 5 10*3/uL (ref 4.0–10.5)

## 2017-07-22 LAB — HEPARIN LEVEL (UNFRACTIONATED)
HEPARIN UNFRACTIONATED: 0.6 [IU]/mL (ref 0.30–0.70)
Heparin Unfractionated: 0.61 IU/mL (ref 0.30–0.70)

## 2017-07-22 MED ORDER — BICTEGRAVIR-EMTRICITAB-TENOFOV 50-200-25 MG PO TABS
1.0000 | ORAL_TABLET | Freq: Every day | ORAL | Status: DC
  Administered 2017-07-23: 1 via ORAL
  Filled 2017-07-22: qty 1

## 2017-07-22 NOTE — Progress Notes (Addendum)
PROGRESS NOTE    Juan Johnston  VXY:[id number] DOB: December 15, 1950 DOA: 07/19/2017 PCP: [name], PA-C   Outpatient Specialists:     Brief Narrative:  67 year old male with history of HIV/AIDS, hyperlipidemia, GERD, was admitted on 4/16 with right leg pain and was found to have extensive right leg DVT.  Vascular surgery was consulted and patient received lysis/TPA.  Not able to have NOAC so plan is to start overlap with coumadin tomorrow.     Assessment & Plan:   Principal Problem:   DVT (deep venous thrombosis) (HCC) Active Problems:   AIDS (acquired immune deficiency syndrome) (HCC)   HIV (human immunodeficiency virus infection) ([place])   GERD (gastroesophageal reflux disease)   DVT of leg (deep venous thrombosis) (HCC)   Extensive right leg DVT -DVT noted in the common femoral, iliac, femoral vein and popliteal vein, right calf pain -Vascular surgery consulted, s/p lysis angiography -May be a candidate for edoxaban if we can get it---pharmacy is looking into it,  otherwise will require Coumadin with heparin/lovenox overlap due to HIV meds-- per vascular, can be started tomm -DVT seems to be provoked as he is post scrotal cyst removal 3 weeks ago -daily CBC to monitor plt count  History of HIV/AIDS -CD4 100, viral load 497 in February 2019, continue home medications, continue prophylaxis  Fever -Likely related to his DVT -no systemic symptoms -cultures pending -hold on abx  GERD -Continue Protonix     DVT prophylaxis:  Fully anticoagulated   Code Status: Full Code   Family Communication:   Disposition Plan:     Consultants:   vascular   Subjective: No pain, anxious to go home  Objective: Vitals:   07/22/17 0355 07/22/17 0500 07/22/17 0700 07/22/17 0900  BP: 103/85     Pulse: 92     Resp: 18 20 20  (!) 22  Temp: 98 F (36.7 C)     TempSrc: Oral     SpO2: 98% 99% 99% 98%  Weight:      Height:        Intake/Output Summary  (Last 24 hours) at 07/22/2017 1334 Last data filed at 07/22/2017 0900 Gross per 24 hour  Intake 240 ml  Output 1250 ml  Net -1010 ml   Filed Weights   07/20/17 0302  Weight: 71.3 kg (157 lb 3 oz)    Examination:  General exam: Appears calm and comfortable  Respiratory system: Clear to auscultation. Respiratory effort normal. Cardiovascular system: S1 & S2 heard, RRR. No JVD, murmurs, rubs, gallops or clicks Gastrointestinal system: Abdomen is nondistended, soft and nontender. No organomegaly or masses felt. Normal bowel sounds heard. Central nervous system: Alert and oriented. No focal neurological deficits. Extremities: right leg in wrap Skin: No rashes, lesions or ulcers Psychiatry: Judgement and insight appear normal. Mood & affect appropriate.     Data Reviewed: I have personally reviewed following labs and imaging studies  CBC: Recent Labs  Lab 07/19/17 1700 07/20/17 0449 07/20/17 1928 07/21/17 0252 07/21/17 0827 07/22/17 0206  WBC 6.1 6.6 7.4 6.8 6.6 5.0  NEUTROABS 4.2  --   --   --   --   --   HGB 14.7 13.3 12.9* 12.6* 12.6* 11.9*  HCT 43.9 40.4 39.1 38.2* 38.0* 35.5*  MCV 96.1 95.5 94.9 94.6 95.0 93.4  PLT 163 150 153 144* 147* 827*   Basic Metabolic Panel: Recent Labs  Lab 07/19/17 1700 07/20/17 0449 07/21/17 0255  NA 138 134* 133*  K 4.1 3.8 4.1  CL 103 100* 101  CO2 26 22 24   GLUCOSE 127* 170* 125*  BUN 8 9 9   CREATININE 0.96 0.99 1.03  CALCIUM 9.8 9.4 8.7*  MG  --   --  1.8   GFR: Estimated Creatinine Clearance: 66 mL/min (by C-G formula based on SCr of 1.03 mg/dL). Liver Function Tests: No results for input(s): AST, ALT, ALKPHOS, BILITOT, PROT, ALBUMIN in the last 168 hours. No results for input(s): LIPASE, AMYLASE in the last 168 hours. No results for input(s): AMMONIA in the last 168 hours. Coagulation Profile: No results for input(s): INR, PROTIME in the last 168 hours. Cardiac Enzymes: No results for input(s): CKTOTAL, CKMB,  CKMBINDEX, TROPONINI in the last 168 hours. BNP (last 3 results) No results for input(s): PROBNP in the last 8760 hours. HbA1C: No results for input(s): HGBA1C in the last 72 hours. CBG: No results for input(s): GLUCAP in the last 168 hours. Lipid Profile: No results for input(s): CHOL, HDL, LDLCALC, TRIG, CHOLHDL, LDLDIRECT in the last 72 hours. Thyroid Function Tests: No results for input(s): TSH, T4TOTAL, FREET4, T3FREE, THYROIDAB in the last 72 hours. Anemia Panel: No results for input(s): VITAMINB12, FOLATE, FERRITIN, TIBC, IRON, RETICCTPCT in the last 72 hours. Urine analysis:    Component Value Date/Time   COLORURINE YELLOW 07/21/2017 1051   APPEARANCEUR CLEAR 07/21/2017 1051   LABSPEC 1.010 07/21/2017 1051   PHURINE 5.0 07/21/2017 1051   GLUCOSEU NEGATIVE 07/21/2017 1051   HGBUR NEGATIVE 07/21/2017 1051   BILIRUBINUR NEGATIVE 07/21/2017 1051   [place] 07/21/2017 1051   PROTEINUR NEGATIVE 07/21/2017 1051   NITRITE NEGATIVE 07/21/2017 1051   LEUKOCYTESUR NEGATIVE 07/21/2017 1051    Recent Results (from the past 240 hour(s))  MRSA PCR Screening     Status: None   Collection Time: 07/20/17  5:34 PM  Result Value Ref Range Status   MRSA by PCR NEGATIVE NEGATIVE Final    Comment:        The GeneXpert MRSA Assay (FDA approved for NASAL specimens only), is one component of a comprehensive MRSA colonization surveillance program. It is not intended to diagnose MRSA infection nor to guide or monitor treatment for MRSA infections. Performed at [facility], [place] [address] [place], [place] [address]   Culture, blood (Routine X 2) w Reflex to ID Panel     Status: None (Preliminary result)   Collection Time: 07/21/17  8:27 AM  Result Value Ref Range Status   Specimen Description BLOOD LEFT ANTECUBITAL  Final   Special Requests   Final    BOTTLES DRAWN AEROBIC AND ANAEROBIC Blood Culture adequate volume   Culture PENDING  Incomplete   Report Status  PENDING  Incomplete  Culture, blood (Routine X 2) w Reflex to ID Panel     Status: None (Preliminary result)   Collection Time: 07/21/17  8:27 AM  Result Value Ref Range Status   Specimen Description BLOOD LEFT FOREARM  Final   Special Requests   Final    BOTTLES DRAWN AEROBIC AND ANAEROBIC Blood Culture adequate volume   Culture PENDING  Incomplete   Report Status PENDING  Incomplete      Anti-infectives (From admission, onward)   Start     Dose/Rate Route Frequency Ordered Stop   07/20/17 1200  darunavir-cobicistat (PREZCOBIX) 800-150 MG per tablet 1 tablet    Note to Pharmacy:  Swallow whole. Do NOT crush, break or chew tablets. Take with food.     1 tablet Oral Daily with breakfast  07/19/17 2319     07/20/17 1000  emtricitabine-tenofovir AF (DESCOVY) 200-25 MG per tablet 1 tablet     1 tablet Oral Daily 07/19/17 2319     07/20/17 1000  sulfamethoxazole-trimethoprim (BACTRIM DS,SEPTRA DS) 800-160 MG per tablet 1 tablet     1 tablet Oral Daily 07/19/17 2319         Radiology Studies: Dg Chest Port 1 View  Result Date: 07/21/2017 CLINICAL DATA:  Fever. EXAM: PORTABLE CHEST 1 VIEW COMPARISON:  Radiographs of February 26, 2017. FINDINGS: The heart size and mediastinal contours are within normal limits. No pneumothorax or pleural effusion is noted. Right lung is clear. Stable left basilar scarring with associated pleural thickening is noted. No acute abnormality is noted. The visualized skeletal structures are unremarkable. IMPRESSION: Stable left basilar scarring with associated pleural thickening. No acute abnormality seen. Electronically Signed   By: Marijo Conception, M.D.   On: 07/21/2017 09:25        Scheduled Meds: . aspirin  81 mg Oral Daily  . darunavir-cobicistat  1 tablet Oral Q breakfast  . emtricitabine-tenofovir AF  1 tablet Oral Daily  . pantoprazole  40 mg Oral BID  . sulfamethoxazole-trimethoprim  1 tablet Oral Daily   Continuous Infusions: . sodium chloride  100 mL/hr at 07/21/17 1200  . sodium chloride    . heparin 1,200 Units/hr (07/21/17 1759)     LOS: 2 days    Time spent: 35 min    Geradine Girt, DO [facility] Pager [phone]  If 7PM-7AM, please contact night-coverage [email or web address] Password [email or web address] 07/22/2017, 1:34 PM

## 2017-07-22 NOTE — Progress Notes (Addendum)
ANTICOAGULATION CONSULT NOTE - Follow Up Consult  Pharmacy Consult for Heparin Indication: DVT, right leg  No Known Allergies  Patient Measurements: Height: 5\' 7"  (170.2 cm) Weight: 157 lb 3 oz (71.3 kg) IBW/kg (Calculated) : 66.1 Heparin Dosing Weight: 71 kg  Vital Signs: Temp: 98 F (36.7 C) (04/19 0355) Temp Source: Oral (04/19 0355) BP: 103/85 (04/19 0355) Pulse Rate: 92 (04/19 0355)  Labs: Recent Labs    07/19/17 1700 07/20/17 0449  07/21/17 0252 07/21/17 0255 07/21/17 0827 07/22/17 0206  HGB 14.7 13.3   < > 12.6*  --  12.6* 11.9*  HCT 43.9 40.4   < > 38.2*  --  38.0* 35.5*  PLT 163 150   < > 144*  --  147* 138*  HEPARINUNFRC  --  0.67   < > 0.33  --  0.34 0.61  CREATININE 0.96 0.99  --   --  1.03  --   --    < > = values in this interval not displayed.    Estimated Creatinine Clearance: 66 mL/min (by C-G formula based on SCr of 1.03 mg/dL).  Assessment: 66 yr old male with extensive right leg DVT s/p thrombectomy on 4/17 and 4/18. He is currently off alteplase now but continues on IV heparin. Pharmacy consulted to manage IV heparin while inpatient.  Heparin level is therapeutic at 0.61 on 1200 units/hr. No bleeding noted. Hgb has trended down post-op, platelets trended down to 138 - watching both while on anticoagulation.  Goal of Therapy:  Heparin level 0.3-0.7 units/ml Monitor platelets by anticoagulation protocol: Yes   Plan:  Continue heparin drip at 1200 units/hr Check confirmatory heparin level this morning Daily heparin level and CBC F/u plan to start warfarin   Renold Genta, PharmD, BCPS Clinical Pharmacist Clinical phone for 07/22/2017 until 4p is [phone] After 4p, please call Main Rx at [phone] for assistance 07/22/2017 8:18 AM    Addendum: Repeat heparin level is therapeutic at 0.6. Continue heparin drip at 1200 units/hr. F/U in the am.  Renold Genta, PharmD, BCPS Clinical Pharmacist 07/22/2017 10:42 AM

## 2017-07-22 NOTE — Progress Notes (Signed)
   VASCULAR SURGERY ASSESSMENT & PLAN:   1 Day Post-Op s/p: Thrombolysis and mechanical thrombectomy of right femoral vein, common femoral vein, external iliac vein, and common iliac vein.  Continue intravenous heparin and leg elevation.  He will need oral anticoagulation.  This could be started tomorrow.  We can begin to mobilize the patient but he will need to elevate his leg when he is back in bed.  SUBJECTIVE:   Some complaint of pain around the right knee.  PHYSICAL EXAM:   Vitals:   07/21/17 1800 07/21/17 1900 07/21/17 1922 07/22/17 0355  BP:   119/60 103/85  Pulse:   76 92  Resp: (!) 22 (!) 23 (!) 21 18  Temp:   98.9 F (37.2 C) 98 F (36.7 C)  TempSrc:   Oral Oral  SpO2: 98% 99% 100% 98%  Weight:      Height:       No drainage on dressing. Leg swelling is improved.  LABS:   Lab Results  Component Value Date   WBC 5.0 07/22/2017   HGB 11.9 (L) 07/22/2017   HCT 35.5 (L) 07/22/2017   MCV 93.4 07/22/2017   PLT 138 (L) 07/22/2017   Lab Results  Component Value Date   CREATININE 1.03 07/21/2017   PROBLEM LIST:    Principal Problem:   DVT (deep venous thrombosis) (HCC) Active Problems:   AIDS (acquired immune deficiency syndrome) (HCC)   HIV (human immunodeficiency virus infection) (HCC)   GERD (gastroesophageal reflux disease)   DVT of leg (deep venous thrombosis) (HCC)   CURRENT MEDS:   . aspirin  81 mg Oral Daily  . darunavir-cobicistat  1 tablet Oral Q breakfast  . emtricitabine-tenofovir AF  1 tablet Oral Daily  . pantoprazole  40 mg Oral BID  . sulfamethoxazole-trimethoprim  1 tablet Oral Daily    Juan Johnston Beeper: [phone] Office: [phone] 07/22/2017

## 2017-07-22 NOTE — Progress Notes (Signed)
To accommodate NOAC necessary for his DVT can change his Prezcobix/Descovy to [facility] based on his resistance profile. He has no integrase resistance and no NRTI resistance.   Please send new Rx to [facility] on [address] where he gets his HIV medications filled under the [employer] program.   Janene Madeira, MSN, NP-C [facility] [place] Office: [phone] Pager: [phone]  07/22/2017  4:04 PM

## 2017-07-22 NOTE — Care Management Note (Addendum)
Case Management Note Marvetta Gibbons RN, BSN Unit 4E-Case Manager [phone]  Patient Details  Name: Juan Johnston MRN: [id number] Date of Birth: 1950-04-15  Subjective/Objective:   Pt admitted with DVT, s/p thrombolysis and mech. Thrombectomy.                  Action/Plan: PTA Pt lived at home- per conversation with pt he does not have Medicare part D or any medication coverage- gets most of his meds (HIV) from [facility]- he states that he should have part D within the next month. Per pharmacy plan is for coumadin - CM to follow for transition of care needs Update- 1600- per pharmacy ID to change HIV meds so that NOAC can be used for anticoag- MD to decide between Xarelto vs Eliquis- pt will need 30 day free card for discharge on drug of choice.    Expected Discharge Date:                 Expected Discharge Plan:  Home/Self Care  In-House Referral:     Discharge planning Services  CM Consult  Post Acute Care Choice:    Choice offered to:     DME Arranged:    DME Agency:     HH Arranged:    HH Agency:     Status of Service:  In process, will continue to follow  If discussed at Long Length of Stay Meetings, dates discussed:    Discharge Disposition:   Additional Comments:  Dawayne Patricia, RN 07/22/2017, 2:42 PM

## 2017-07-22 NOTE — Progress Notes (Signed)
PHARMACY - Anticoagulation and HIV Medications  Current HIV meds (Prezcobix + Descovy) interact with Xarelto and Eliquis but DO NOT interact with Savaysa. I had trouble finding medication assistance for Savaysa. He has no insurance but will be signing up within a month for Medicare part D.  I spoke with Colletta [place], NP with ID who is changing patient to [facility] so that he can use Eliquis or Xarelto. Case management has 30 day free trial cards for these medications.  I have updated the patient with all these details. I communicated the following to him: 1) pick up his new HIV medication from [facility] upon discharge 2) take his prescription for either Eliquis or Xarelto and a 30 day free card to pharmacy 3) sign up for prescription insurance as soon as possible   Renold Genta, PharmD, BCPS Clinical Pharmacist Clinical phone for 07/22/2017 until 4p is [phone] After 4p, please call Main Rx at [phone] for assistance 07/22/2017 4:06 PM

## 2017-07-23 DIAGNOSIS — I82411 Acute embolism and thrombosis of right femoral vein: Secondary | ICD-10-CM

## 2017-07-23 LAB — BASIC METABOLIC PANEL
ANION GAP: 8 (ref 5–15)
BUN: 5 mg/dL — ABNORMAL LOW (ref 6–20)
CALCIUM: 8.9 mg/dL (ref 8.9–10.3)
CO2: 22 mmol/L (ref 22–32)
Chloride: 106 mmol/L (ref 101–111)
Creatinine, Ser: 0.81 mg/dL (ref 0.61–1.24)
GLUCOSE: 147 mg/dL — AB (ref 65–99)
POTASSIUM: 3.7 mmol/L (ref 3.5–5.1)
Sodium: 136 mmol/L (ref 135–145)

## 2017-07-23 LAB — CBC
HCT: 35 % — ABNORMAL LOW (ref 39.0–52.0)
HEMOGLOBIN: 11.8 g/dL — AB (ref 13.0–17.0)
MCH: 31.4 pg (ref 26.0–34.0)
MCHC: 33.7 g/dL (ref 30.0–36.0)
MCV: 93.1 fL (ref 78.0–100.0)
Platelets: 154 10*3/uL (ref 150–400)
RBC: 3.76 MIL/uL — AB (ref 4.22–5.81)
RDW: 13.7 % (ref 11.5–15.5)
WBC: 3.3 10*3/uL — ABNORMAL LOW (ref 4.0–10.5)

## 2017-07-23 LAB — HEPARIN LEVEL (UNFRACTIONATED): HEPARIN UNFRACTIONATED: 0.33 [IU]/mL (ref 0.30–0.70)

## 2017-07-23 MED ORDER — SULFAMETHOXAZOLE-TRIMETHOPRIM 800-160 MG PO TABS: 1.0000 | ORAL_TABLET | Freq: Every day | ORAL | 0 refills | Status: DC

## 2017-07-23 MED ORDER — BICTEGRAVIR-EMTRICITAB-TENOFOV 50-200-25 MG PO TABS: 1.0000 | ORAL_TABLET | Freq: Every day | ORAL | 0 refills | Status: DC

## 2017-07-23 MED ORDER — RIVAROXABAN 15 MG PO TABS
15.0000 mg | ORAL_TABLET | Freq: Two times a day (BID) | ORAL | Status: DC
  Administered 2017-07-23: 15 mg via ORAL
  Filled 2017-07-23: qty 1

## 2017-07-23 MED ORDER — PANTOPRAZOLE SODIUM 40 MG PO TBEC: 40.0000 mg | DELAYED_RELEASE_TABLET | Freq: Two times a day (BID) | ORAL | 0 refills | Status: DC

## 2017-07-23 MED ORDER — ASPIRIN 81 MG PO CHEW: 81.0000 mg | CHEWABLE_TABLET | Freq: Every day | ORAL | 0 refills | Status: AC

## 2017-07-23 MED ORDER — ACETAMINOPHEN 325 MG PO TABS: 650.0000 mg | ORAL_TABLET | Freq: Four times a day (QID) | ORAL | 0 refills | Status: DC | PRN

## 2017-07-23 MED ORDER — RIVAROXABAN (XARELTO) VTE STARTER PACK (15 & 20 MG): ORAL_TABLET | ORAL | 0 refills | Status: DC

## 2017-07-23 NOTE — Discharge Summary (Addendum)
Physician Discharge Summary  Esaul Dorwart VFI:[id number] DOB: February 17, 1951 DOA: 07/19/2017  PCP: [name], PA-C  Admit date: 07/19/2017 Discharge date: 07/23/2017  Recommendations for Outpatient Follow-up:  Continue xarelto 15 mg twice a day for 21 days then on day 22 start xarelto 20 mg daily.  Continue aspirin.  Monitor for any bleed and if any sign of bleeding stop xarelto and aspirin please call PCP or come to ED for evaluation.  Follow up with ID.   Discharge Diagnoses:  Principal Problem:   DVT (deep venous thrombosis) (HCC) Active Problems:   AIDS (acquired immune deficiency syndrome) (HCC)   HIV (human immunodeficiency virus infection) ([place])   GERD (gastroesophageal reflux disease)   DVT of leg (deep venous thrombosis) ([place])   Discharge Condition: stable   Diet recommendation: as tolerated   History of present illness:  67 year old male with history of HIV/AIDS, hyperlipidemia, GERD, was admitted on 4/16 with right leg pain and was found to have extensive right leg DVT. Vascular surgery was consulted and patient received lysis/TPA. Per vascular sx, started xarelto.   Hospital Course:   Principal Problem: Extensive right leg DVT - DVT noted in the common femoral, iliac, femoral vein and popliteal vein, right calf pain - Vascular surgery consulted, s/p lysis angiography - Per vascular okay for xarelto as prescribed    Active Problems:   AIDS (acquired immune deficiency syndrome) (HCC) / HIV (human immunodeficiency virus infection) ([place]) - Meds per ID    GERD (gastroesophageal reflux disease) - Continue protonix   Signed:  Leisa Lenz, MD  [facility] 07/23/2017, 10:12 AM  Pager #: [phone]  Time spent in minutes: 30 minutes  Procedures:  Lysis  Consultations:  Vascular surgery   ID  Discharge Exam: Vitals:   07/22/17 2101 07/23/17 0441  BP: 120/71 121/66  Pulse:    Resp: 15 17  Temp: (!) 97.5 F (36.4 C) 98.1 F  (36.7 C)  SpO2: 97% 96%   Vitals:   07/22/17 0700 07/22/17 0900 07/22/17 2101 07/23/17 0441  BP:   120/71 121/66  Pulse:      Resp: 20 (!) 22 15 17   Temp:   (!) 97.5 F (36.4 C) 98.1 F (36.7 C)  TempSrc:   Oral Oral  SpO2: 99% 98% 97% 96%  Weight:      Height:        General: Pt is alert, follows commands appropriately, not in acute distress Cardiovascular: Regular rate and rhythm, S1/S2 + Respiratory: Clear to auscultation bilaterally, no wheezing, no crackles, no rhonchi Abdominal: Soft, non tender, non distended, bowel sounds +, no guarding Extremities: no cyanosis, pulses palpable bilaterally DP and PT Neuro: Grossly nonfocal  Discharge Instructions  Discharge Instructions    Call MD for:  difficulty breathing, headache or visual disturbances   Complete by:  As directed    Call MD for:  redness, tenderness, or signs of infection (pain, swelling, redness, odor or green/yellow discharge around incision site)   Complete by:  As directed    Call MD for:  severe uncontrolled pain   Complete by:  As directed    Diet - low sodium heart healthy   Complete by:  As directed    Discharge instructions   Complete by:  As directed    Continue xarelto 15 mg twice a day for 21 days then on day 22 start xarelto 20 mg daily.  Continue aspirin.  Monitor for any bleed and if any sign of bleeding stop xarelto and aspirin  please call PCP or come to ED for evaluation.  Follow up with ID.   Increase activity slowly   Complete by:  As directed      Allergies as of 07/23/2017   No Known Allergies     Medication List    STOP taking these medications   darunavir-cobicistat 800-150 MG tablet Commonly known as:  PREZCOBIX   emtricitabine-tenofovir AF 200-25 MG tablet Commonly known as:  DESCOVY     TAKE these medications   acetaminophen 325 MG tablet Commonly known as:  TYLENOL Take 2 tablets (650 mg total) by mouth every 6 (six) hours as needed for mild pain (or Fever >/=  101).   aspirin 81 MG chewable tablet Chew 1 tablet (81 mg total) by mouth daily.   bictegravir-emtricitabine-tenofovir AF 50-200-25 MG Tabs tablet Commonly known as:  BIKTARVY Take 1 tablet by mouth daily.   dextromethorphan 30 MG/5ML liquid Commonly known as:  DELSYM Take 30 mg by mouth daily as needed for cough.   diphenhydrAMINE 25 MG tablet Commonly known as:  BENADRYL Take 25 mg by mouth every 6 (six) hours as needed for itching or allergies.   hydrOXYzine 25 MG tablet Commonly known as:  ATARAX/VISTARIL Take 1 tablet (25 mg total) by mouth 3 (three) times daily as needed. What changed:  reasons to take this   pantoprazole 40 MG tablet Commonly known as:  PROTONIX Take 1 tablet (40 mg total) by mouth 2 (two) times daily.   Rivaroxaban 15 & 20 MG Tbpk Take as directed on package: Start with one 15mg  tablet by mouth twice a day with food. On Day 22, switch to one 20mg  tablet once a day with food.   sulfamethoxazole-trimethoprim 800-160 MG tablet Commonly known as:  BACTRIM DS,SEPTRA DS Take 1 tablet by mouth daily. What changed:  when to take this      Follow-up Information    Serafina Mitchell, MD. Schedule an appointment as soon as possible for a visit in 2 week(s).   Specialties:  Vascular Surgery, Cardiology Contact information: [address] [place] [address] [phone]            The results of significant diagnostics from this hospitalization (including imaging, microbiology, ancillary and laboratory) are listed below for reference.    Significant Diagnostic Studies: Dg Chest Port 1 View Result Date: 07/21/2017 Stable left basilar scarring with associated pleural thickening. No acute abnormality seen.    Microbiology: Recent Results (from the past 240 hour(s))  MRSA PCR Screening     Status: None   Collection Time: 07/20/17  5:34 PM  Result Value Ref Range Status   MRSA by PCR NEGATIVE NEGATIVE Final  Culture, blood (Routine X 2) w Reflex  to ID Panel     Status: None (Preliminary result)   Collection Time: 07/21/17  8:27 AM  Result Value Ref Range Status   Specimen Description BLOOD LEFT ANTECUBITAL  Final   Special Requests   Final    BOTTLES DRAWN AEROBIC AND ANAEROBIC Blood Culture adequate volume   Culture   Final    NO GROWTH 1 DAY Performed at [facility], [address], [place], [place] [address]    Report Status PENDING  Incomplete  Culture, blood (Routine X 2) w Reflex to ID Panel     Status: None (Preliminary result)   Collection Time: 07/21/17  8:27 AM  Result Value Ref Range Status   Specimen Description BLOOD LEFT FOREARM  Final   Special  Requests   Final    BOTTLES DRAWN AEROBIC AND ANAEROBIC Blood Culture adequate volume   Culture   Final    NO GROWTH 1 DAY Performed at [facility], [place] [address], [place], [place] [address]    Report Status PENDING  Incomplete     Labs: Basic Metabolic Panel: Recent Labs  Lab 07/19/17 1700 07/20/17 0449 07/21/17 0255 07/23/17 0451  NA 138 134* 133* 136  K 4.1 3.8 4.1 3.7  CL 103 100* 101 106  CO2 26 22 24 22   GLUCOSE 127* 170* 125* 147*  BUN 8 9 9  5*  CREATININE 0.96 0.99 1.03 0.81  CALCIUM 9.8 9.4 8.7* 8.9  MG  --   --  1.8  --    Liver Function Tests: No results for input(s): AST, ALT, ALKPHOS, BILITOT, PROT, ALBUMIN in the last 168 hours. No results for input(s): LIPASE, AMYLASE in the last 168 hours. No results for input(s): AMMONIA in the last 168 hours. CBC: Recent Labs  Lab 07/19/17 1700  07/20/17 1928 07/21/17 0252 07/21/17 0827 07/22/17 0206 07/23/17 0451  WBC 6.1   < > 7.4 6.8 6.6 5.0 3.3*  NEUTROABS 4.2  --   --   --   --   --   --   HGB 14.7   < > 12.9* 12.6* 12.6* 11.9* 11.8*  HCT 43.9   < > 39.1 38.2* 38.0* 35.5* 35.0*  MCV 96.1   < > 94.9 94.6 95.0 93.4 93.1  PLT 163   < > 153 144* 147* 138* 154   < > = values in this interval not displayed.   Cardiac Enzymes: No results for input(s): CKTOTAL, CKMB,  CKMBINDEX, TROPONINI in the last 168 hours. BNP: BNP (last 3 results) No results for input(s): BNP in the last 8760 hours.  ProBNP (last 3 results) No results for input(s): PROBNP in the last 8760 hours.  CBG: No results for input(s): GLUCAP in the last 168 hours.

## 2017-07-23 NOTE — Progress Notes (Signed)
ANTICOAGULATION CONSULT NOTE - Initial Consult  Pharmacy Consult for Xarelto Indication: DVT  No Known Allergies  Patient Measurements: Height: 5\' 7"  (170.2 cm) Weight: 157 lb 3 oz (71.3 kg) IBW/kg (Calculated) : 66.1  Vital Signs: Temp: 98.1 F (36.7 C) (04/20 0441) Temp Source: Oral (04/20 0441) BP: 121/66 (04/20 0441)  Labs: Recent Labs    07/21/17 0255 07/21/17 0827 07/22/17 0206 07/22/17 0853 07/23/17 0451  HGB  --  12.6* 11.9*  --  11.8*  HCT  --  38.0* 35.5*  --  35.0*  PLT  --  147* 138*  --  154  HEPARINUNFRC  --  0.34 0.61 0.60 0.33  CREATININE 1.03  --   --   --  0.81    Estimated Creatinine Clearance: 83.9 mL/min (by C-G formula based on SCr of 0.81 mg/dL).   Medical History: Past Medical History:  Diagnosis Date  . HIV infection ([place])   . Thyroid goiter     Medications:  Scheduled:  . aspirin  81 mg Oral Daily  . bictegravir-emtricitabine-tenofovir AF  1 tablet Oral Daily  . pantoprazole  40 mg Oral BID  . sulfamethoxazole-trimethoprim  1 tablet Oral Daily    Assessment: 26 YOM admitted with R-leg DVT started on heparin, now to transition to PO treatment.  HIV regimen was changed from Prezcobix + Descovy to [place] to eliminate interactions with DOAC treatment. CBC low but stable and no s/s bleeding reported.    Goal of Therapy:  Monitor platelets by anticoagulation protocol: Yes   Plan:  Start Xarelto 15mg  BID x 21d followed by 20mg  once daily Discontinue heparin gtt at same time as Xarelto administration, communicated plan with RN Monitor daily CBC, s/s bleeding  Bertis Ruddy, PharmD Pharmacy Resident Pager #: [phone] 07/23/2017 8:45 AM

## 2017-07-23 NOTE — Discharge Instructions (Signed)
Rivaroxaban oral tablets °What is this medicine? °RIVAROXABAN (ri va ROX a ban) is an anticoagulant (blood thinner). It is used to treat blood clots in the lungs or in the veins. It is also used after knee or hip surgeries to prevent blood clots. It is also used to lower the chance of stroke in people with a medical condition called atrial fibrillation. °This medicine may be used for other purposes; ask your health care provider or pharmacist if you have questions. °COMMON BRAND NAME(S): Xarelto, Xarelto Starter Pack °What should I tell my health care provider before I take this medicine? °They need to know if you have any of these conditions: °-bleeding disorders °-bleeding in the brain °-blood in your stools (black or tarry stools) or if you have blood in your vomit °-history of stomach bleeding °-kidney disease °-liver disease °-low blood counts, like low white cell, platelet, or red cell counts °-recent or planned spinal or epidural procedure °-take medicines that treat or prevent blood clots °-an unusual or allergic reaction to rivaroxaban, other medicines, foods, dyes, or preservatives °-pregnant or trying to get pregnant °-breast-feeding °How should I use this medicine? °Take this medicine by mouth with a glass of water. Follow the directions on the prescription label. Take your medicine at regular intervals. Do not take it more often than directed. Do not stop taking except on your doctor's advice. Stopping this medicine may increase your risk of a blood clot. Be sure to refill your prescription before you run out of medicine. °If you are taking this medicine after hip or knee replacement surgery, take it with or without food. If you are taking this medicine for atrial fibrillation, take it with your evening meal. If you are taking this medicine to treat blood clots, take it with food at the same time each day. If you are unable to swallow your tablet, you may crush the tablet and mix it in applesauce. Then,  immediately eat the applesauce. You should eat more food right after you eat the applesauce containing the crushed tablet. °Talk to your pediatrician regarding the use of this medicine in children. Special care may be needed. °Overdosage: If you think you have taken too much of this medicine contact a poison control center or emergency room at once. °NOTE: This medicine is only for you. Do not share this medicine with others. °What if I miss a dose? °If you take your medicine once a day and miss a dose, take the missed dose as soon as you remember. If you take your medicine twice a day and miss a dose, take the missed dose immediately. In this instance, 2 tablets may be taken at the same time. The next day you should take 1 tablet twice a day as directed. °What may interact with this medicine? °Do not take this medicine with any of the following medications: °-defibrotide °This medicine may also interact with the following medications: °-aspirin and aspirin-like medicines °-certain antibiotics like erythromycin, azithromycin, and clarithromycin °-certain medicines for fungal infections like ketoconazole and itraconazole °-certain medicines for irregular heart beat like amiodarone, quinidine, dronedarone °-certain medicines for seizures like carbamazepine, phenytoin °-certain medicines that treat or prevent blood clots like warfarin, enoxaparin, and dalteparin °-conivaptan °-diltiazem °-felodipine °-indinavir °-lopinavir; ritonavir °-NSAIDS, medicines for pain and inflammation, like ibuprofen or naproxen °-ranolazine °-rifampin °-ritonavir °-SNRIs, medicines for depression, like desvenlafaxine, duloxetine, levomilnacipran, venlafaxine °-SSRIs, medicines for depression, like citalopram, escitalopram, fluoxetine, fluvoxamine, paroxetine, sertraline °-St. John's wort °-verapamil °This list may not describe all   possible interactions. Give your health care provider a list of all the medicines, herbs, non-prescription  drugs, or dietary supplements you use. Also tell them if you smoke, drink alcohol, or use illegal drugs. Some items may interact with your medicine. °What should I watch for while using this medicine? °Visit your doctor or health care professional for regular checks on your progress. °Notify your doctor or health care professional and seek emergency treatment if you develop breathing problems; changes in vision; chest pain; severe, sudden headache; pain, swelling, warmth in the leg; trouble speaking; sudden numbness or weakness of the face, arm or leg. These can be signs that your condition has gotten worse. °If you are going to have surgery or other procedure, tell your doctor that you are taking this medicine. °What side effects may I notice from receiving this medicine? °Side effects that you should report to your doctor or health care professional as soon as possible: °-allergic reactions like skin rash, itching or hives, swelling of the face, lips, or tongue °-back pain °-redness, blistering, peeling or loosening of the skin, including inside the mouth °-signs and symptoms of bleeding such as bloody or black, tarry stools; red or dark-brown urine; spitting up blood or brown material that looks like coffee grounds; red spots on the skin; unusual bruising or bleeding from the eye, gums, or nose °Side effects that usually do not require medical attention (report to your doctor or health care professional if they continue or are bothersome): °-dizziness °-muscle pain °This list may not describe all possible side effects. Call your doctor for medical advice about side effects. You may report side effects to FDA at 1-800-FDA-1088. °Where should I keep my medicine? °Keep out of the reach of children. °Store at room temperature between 15 and 30 degrees C (59 and 86 degrees F). Throw away any unused medicine after the expiration date. °NOTE: This sheet is a summary. It may not cover all possible information. If you  have questions about this medicine, talk to your doctor, pharmacist, or health care provider. °© 2018 [employer]vier/Gold Standard (2015-12-10 16:29:33) ° °

## 2017-07-23 NOTE — Progress Notes (Addendum)
  Progress Note    07/23/2017 8:00 AM 2 Days Post-Op  Subjective:  No complaints this am  Afebrile   Vitals:   07/22/17 2101 07/23/17 0441  BP: 120/71 121/66  Pulse:    Resp: 15 17  Temp: (!) 97.5 F (36.4 C) 98.1 F (36.7 C)  SpO2: 97% 96%    Physical Exam: General:  No distress Lungs:  Non labored Extremities:  Right thigh and calf are soft and non tender; ace wrap in place  CBC    Component Value Date/Time   WBC 3.3 (L) 07/23/2017 0451   RBC 3.76 (L) 07/23/2017 0451   HGB 11.8 (L) 07/23/2017 0451   HGB 11.3 (L) 03/07/2017 1508   HCT 35.0 (L) 07/23/2017 0451   HCT 35.1 (L) 03/07/2017 1508   PLT 154 07/23/2017 0451   PLT 235 03/07/2017 1508   MCV 93.1 07/23/2017 0451   MCV 92 03/07/2017 1508   MCH 31.4 07/23/2017 0451   MCHC 33.7 07/23/2017 0451   RDW 13.7 07/23/2017 0451   RDW 13.3 03/07/2017 1508   LYMPHSABS 1.2 07/19/2017 1700   LYMPHSABS 0.6 (L) 03/07/2017 1508   MONOABS 0.4 07/19/2017 1700   EOSABS 0.3 07/19/2017 1700   EOSABS 0.2 03/07/2017 1508   BASOSABS 0.0 07/19/2017 1700   BASOSABS 0.0 03/07/2017 1508    BMET    Component Value Date/Time   NA 136 07/23/2017 0451   K 3.7 07/23/2017 0451   CL 106 07/23/2017 0451   CO2 22 07/23/2017 0451   GLUCOSE 147 (H) 07/23/2017 0451   BUN 5 (L) 07/23/2017 0451   CREATININE 0.81 07/23/2017 0451   CREATININE 1.02 05/19/2017 1422   CALCIUM 8.9 07/23/2017 0451   GFRNONAA >60 07/23/2017 0451   GFRNONAA 76 05/19/2017 1422   GFRAA >60 07/23/2017 0451   GFRAA 88 05/19/2017 1422    INR No results found for: INR   Intake/Output Summary (Last 24 hours) at 07/23/2017 0800 Last data filed at 07/23/2017 0444 Gross per 24 hour  Intake 4448.2 ml  Output 2325 ml  Net 2123.2 ml     Assessment:  67 y.o. male is s/p:  Thrombolysis and mechanical thrombectomy of right femoral vein, common femoral vein, external iliac vein, and common iliac vein.  2 Days Post-Op  Plan: -HIV medications have been changed  so that there is minimal interaction with Xarelto or Eliquis.  Will start Xarelto this morning.   -would benefit from thigh high 20-13mmg Hg compression stockings, but unsure if pt will be able to afford these.  Continue moderate compression with ace wrap.  -pt needs to ambulate but elevate leg when not walking -DVT prophylaxis:  Heparin to Xarelto   Leontine Locket, PA-C [facility] [phone] 07/23/2017 8:00 AM  I have interviewed the patient and examined the patient. I agree with the findings by the PA. OK to begin Xarelto.  Continue to mobilize. F/U with Dr. Trula Slade in 2-3 weeks.   Gae Gallop, MD [phone]

## 2017-07-26 LAB — CULTURE, BLOOD (ROUTINE X 2)
CULTURE: NO GROWTH
CULTURE: NO GROWTH
SPECIAL REQUESTS: ADEQUATE
SPECIAL REQUESTS: ADEQUATE

## 2017-08-15 ENCOUNTER — Encounter: Payer: Self-pay | Admitting: Surgery

## 2017-08-15 ENCOUNTER — Other Ambulatory Visit: Payer: Self-pay

## 2017-08-15 ENCOUNTER — Other Ambulatory Visit: Payer: Self-pay | Admitting: Surgery

## 2017-08-15 ENCOUNTER — Ambulatory Visit (INDEPENDENT_AMBULATORY_CARE_PROVIDER_SITE_OTHER): Payer: Self-pay | Admitting: Surgery

## 2017-08-15 VITALS — BP 128/76 | HR 80 | Temp 97.0°F | Resp 16 | Wt 161.0 lb

## 2017-08-15 DIAGNOSIS — I824Z3 Acute embolism and thrombosis of unspecified deep veins of distal lower extremity, bilateral: Secondary | ICD-10-CM

## 2017-08-15 MED ORDER — RIVAROXABAN (XARELTO) VTE STARTER PACK (15 & 20 MG): 20.0000 mg | ORAL_TABLET | Freq: Every day | ORAL | 6 refills | Status: DC

## 2017-08-15 NOTE — Progress Notes (Signed)
[facility]  Patient name: Juan Johnston MRN: [id number] DOB: 1950-05-05 Sex: male   REASON FOR VISIT:    Follow up  HISOTRY OF PRESENT ILLNESS:    Juan Johnston is a 67 y.o. male who presented to the emergency department on 07/20/2017 with pain and swelling in his right leg.  Ultrasound showed an acute DVT.  Later that day he underwent a venogram and intravascular ultrasound followed by placement of a lysis catheter.  Ultrasound showed nonocclusive thrombus in the common iliac vein was extended up into the inferior vena cava to at least the level of the renal veins.  Following day he went back for his follow-up he had mechanical thrombectomy of the inferior vena cava, right iliac system and right common femoral and femoral popliteal veins using the clotriever device.  He had an excellent result.  He was started on anticoagulation and discharged.  He is back today for follow-up.  He does not have any significant swelling in his legs.  He does have some numbness in his toes.  His HIV medications had to be adjusted given that he was started on Xarelto.  He has not gotten a pair of compression stockings.   PAST MEDICAL HISTORY:   Past Medical History:  Diagnosis Date  . HIV infection ([place])   . Thyroid goiter      FAMILY HISTORY:   Family History  Problem Relation Age of Onset  . Hypertension Mother     SOCIAL HISTORY:   Social History   Tobacco Use  . Smoking status: Current Some Day Smoker    Types: Cigarettes    Last attempt to quit: 02/03/2017    Years since quitting: 0.5  . Smokeless tobacco: Never Used  Substance Use Topics  . Alcohol use: Yes     ALLERGIES:   No Known Allergies   CURRENT MEDICATIONS:   Current Outpatient Medications  Medication Sig Dispense Refill  . acetaminophen (TYLENOL) 325 MG tablet Take 2 tablets (650 mg total) by mouth every 6 (six) hours as needed for mild pain  (or Fever >/= 101). 30 tablet 0  . aspirin 81 MG chewable tablet Chew 1 tablet (81 mg total) by mouth daily. 30 tablet 0  . bictegravir-emtricitabine-tenofovir AF (BIKTARVY) 50-200-25 MG TABS tablet Take 1 tablet by mouth daily. 30 tablet 0  . diphenhydrAMINE (BENADRYL) 25 MG tablet Take 25 mg by mouth every 6 (six) hours as needed for itching or allergies.     . Rivaroxaban 15 & 20 MG TBPK Take as directed on package: Start with one 15mg  tablet by mouth twice a day with food. On Day 22, switch to one 20mg  tablet once a day with food. 51 each 0  . sulfamethoxazole-trimethoprim (BACTRIM DS,SEPTRA DS) 800-160 MG tablet Take 1 tablet by mouth daily. 30 tablet 0  . dextromethorphan (DELSYM) 30 MG/5ML liquid Take 30 mg by mouth daily as needed for cough.    . pantoprazole (PROTONIX) 40 MG tablet Take 1 tablet (40 mg total) by mouth 2 (two) times daily. (Patient not taking: Reported on 08/15/2017) 60 tablet 0   No current facility-administered medications for this visit.     REVIEW OF SYSTEMS:   [X]  denotes positive finding, [ ]  denotes negative finding Cardiac  Comments:  Chest pain or chest pressure:  Palpable pedal pulses.  No significant lower extremity edema   Shortness of breath upon exertion:    Short of breath when lying flat:    Irregular  heart rhythm:        Vascular    Pain in calf, thigh, or hip brought on by ambulation:    Pain in feet at night that wakes you up from your sleep:     Blood clot in your veins:    Leg swelling:         Pulmonary    Oxygen at home:    Productive cough:     Wheezing:         Neurologic    Sudden weakness in arms or legs:     Sudden numbness in arms or legs:     Sudden onset of difficulty speaking or slurred speech:    Temporary loss of vision in one eye:     Problems with dizziness:         Gastrointestinal    Blood in stool:     Vomited blood:         Genitourinary    Burning when urinating:     Blood in urine:        Psychiatric      Major depression:         Hematologic    Bleeding problems:    Problems with blood clotting too easily:        Skin    Rashes or ulcers:        Constitutional    Fever or chills:      PHYSICAL EXAM:   Vitals:   08/15/17 1036  BP: 128/76  Pulse: 80  Resp: 16  Temp: (!) 97 F (36.1 C)  TempSrc: Oral  SpO2: 98%  Weight: 161 lb (73 kg)    GENERAL: The patient is a well-nourished male, in no acute distress. The vital signs are documented above. CARDIAC: There is a regular rate and rhythm.  VASCULAR: Palpable pedal pulses.  No significant lower extremity edema PULMONARY: Non-labored respirations MUSCULOSKELETAL: There are no major deformities or cyanosis. NEUROLOGIC: No focal weakness or paresthesias are detected. SKIN: There are no ulcers or rashes noted. PSYCHIATRIC: The patient has a normal affect.  STUDIES:   None  MEDICAL ISSUES:   DVT: Patient had a ileo-caval as well as femoral-popliteal DVT successfully treated.  He is not having any significant problems with edema.  I suspect he will need anticoagulation for 3-6 months.  I am giving him information regarding how to obtain compression stockings.  He will follow-up in 3 months with ileo-caval ultrasound.    Annamarie Major, MD [facility] [phone] Pager [phone]

## 2017-08-16 ENCOUNTER — Other Ambulatory Visit: Payer: Self-pay | Admitting: Behavioral Health

## 2017-08-16 DIAGNOSIS — B20 Human immunodeficiency virus [HIV] disease: Secondary | ICD-10-CM

## 2017-08-16 MED ORDER — SULFAMETHOXAZOLE-TRIMETHOPRIM 800-160 MG PO TABS: 1.0000 | ORAL_TABLET | Freq: Every day | ORAL | 2 refills | Status: DC

## 2017-08-16 MED ORDER — BICTEGRAVIR-EMTRICITAB-TENOFOV 50-200-25 MG PO TABS: 1.0000 | ORAL_TABLET | Freq: Every day | ORAL | 2 refills | Status: DC

## 2017-09-12 ENCOUNTER — Other Ambulatory Visit: Payer: Self-pay

## 2017-09-12 DIAGNOSIS — I824Z3 Acute embolism and thrombosis of unspecified deep veins of distal lower extremity, bilateral: Secondary | ICD-10-CM

## 2017-10-04 ENCOUNTER — Telehealth: Payer: Self-pay | Admitting: *Deleted

## 2017-10-04 ENCOUNTER — Other Ambulatory Visit: Payer: Medicare Other

## 2017-10-04 DIAGNOSIS — B20 Human immunodeficiency virus [HIV] disease: Secondary | ICD-10-CM

## 2017-10-04 NOTE — Telephone Encounter (Signed)
Patient called c/o increasing leg pain left greater than right.  Denies coldness and decrease color, sensation in  Extremity. Request to be soon as possible due to pain. Appt made with Juan Johnston. 9 amTime given to patient.

## 2017-10-05 LAB — T-HELPER CELL (CD4) - (RCID CLINIC ONLY)
CD4 T CELL ABS: 180 /uL — AB (ref 400–2700)
CD4 T CELL HELPER: 13 % — AB (ref 33–55)

## 2017-10-10 ENCOUNTER — Encounter: Payer: Self-pay | Admitting: Surgery

## 2017-10-10 ENCOUNTER — Other Ambulatory Visit: Payer: Self-pay

## 2017-10-10 ENCOUNTER — Ambulatory Visit (INDEPENDENT_AMBULATORY_CARE_PROVIDER_SITE_OTHER): Payer: Self-pay | Admitting: Family

## 2017-10-10 ENCOUNTER — Encounter: Payer: Self-pay | Admitting: Family

## 2017-10-10 VITALS — BP 126/81 | HR 74 | Temp 97.2°F | Resp 16 | Ht 67.0 in | Wt 169.0 lb

## 2017-10-10 DIAGNOSIS — I824Z3 Acute embolism and thrombosis of unspecified deep veins of distal lower extremity, bilateral: Secondary | ICD-10-CM

## 2017-10-10 DIAGNOSIS — M79605 Pain in left leg: Secondary | ICD-10-CM

## 2017-10-10 DIAGNOSIS — M79604 Pain in right leg: Secondary | ICD-10-CM

## 2017-10-10 DIAGNOSIS — F17200 Nicotine dependence, unspecified, uncomplicated: Secondary | ICD-10-CM

## 2017-10-10 LAB — HIV-1 RNA QUANT-NO REFLEX-BLD
HIV 1 RNA Quant: <20 copies/mL — AB
HIV-1 RNA Quant, Log: <1.3 Log copies/mL — AB

## 2017-10-10 NOTE — Patient Instructions (Addendum)
Before your next abdominal ultrasound:  Take two Extra-Strength Gas-X capsules at bedtime the night before the test. Take another two Extra-Strength Gas-X capsules 3 hours before the test.  Avoid gas forming foods and beverages the day before the test.      Steps to Quit Smoking Smoking tobacco can be bad for your health. It can also affect almost every organ in your body. Smoking puts you and people around you at risk for many serious long-lasting (chronic) diseases. Quitting smoking is hard, but it is one of the best things that you can do for your health. It is never too late to quit. What are the benefits of quitting smoking? When you quit smoking, you lower your risk for getting serious diseases and conditions. They can include:  Lung cancer or lung disease.  Heart disease.  Stroke.  Heart attack.  Not being able to have children (infertility).  Weak bones (osteoporosis) and broken bones (fractures).  If you have coughing, wheezing, and shortness of breath, those symptoms may get better when you quit. You may also get sick less often. If you are pregnant, quitting smoking can help to lower your chances of having a baby of low birth weight. What can I do to help me quit smoking? Talk with your doctor about what can help you quit smoking. Some things you can do (strategies) include:  Quitting smoking totally, instead of slowly cutting back how much you smoke over a period of time.  Going to in-person counseling. You are more likely to quit if you go to many counseling sessions.  Using resources and support systems, such as: ? Database administrator with a Social worker. ? Phone quitlines. ? Careers information officer. ? Support groups or group counseling. ? Text messaging programs. ? Mobile phone apps or applications.  Taking medicines. Some of these medicines may have nicotine in them. If you are pregnant or breastfeeding, do not take any medicines to quit smoking unless your doctor  says it is okay. Talk with your doctor about counseling or other things that can help you.  Talk with your doctor about using more than one strategy at the same time, such as taking medicines while you are also going to in-person counseling. This can help make quitting easier. What things can I do to make it easier to quit? Quitting smoking might feel very hard at first, but there is a lot that you can do to make it easier. Take these steps:  Talk to your family and friends. Ask them to support and encourage you.  Call phone quitlines, reach out to support groups, or work with a Social worker.  Ask people who smoke to not smoke around you.  Avoid places that make you want (trigger) to smoke, such as: ? Bars. ? Parties. ? Smoke-break areas at work.  Spend time with people who do not smoke.  Lower the stress in your life. Stress can make you want to smoke. Try these things to help your stress: ? Getting regular exercise. ? Deep-breathing exercises. ? Yoga. ? Meditating. ? Doing a body scan. To do this, close your eyes, focus on one area of your body at a time from head to toe, and notice which parts of your body are tense. Try to relax the muscles in those areas.  Download or buy apps on your mobile phone or tablet that can help you stick to your quit plan. There are many free apps, such as QuitGuide from the [employer] Office manager for Disease Control  and Prevention). You can find more support from smokefree.gov and other websites.  This information is not intended to replace advice given to you by your health care provider. Make sure you discuss any questions you have with your health care provider. Document Released: 01/16/2009 Document Revised: 11/18/2015 Document Reviewed: 08/06/2014 Elsevier Interactive Patient Education  2018 [place].     Deep Vein Thrombosis Deep vein thrombosis (DVT) is a condition in which a blood clot forms in a deep vein, such as a lower leg, thigh, or arm vein.  A clot is blood that has thickened into a gel or solid. This condition is dangerous. It can lead to serious and even life-threatening complications if the clot travels to the lungs and causes a blockage (pulmonary embolism). It can also damage veins in the leg. This can result in leg pain, swelling, discoloration, and sores (post-thrombotic syndrome). What are the causes? This condition may be caused by:  A slowdown of blood flow.  Damage to a vein.  A condition that makes blood clot more easily.  What increases the risk? The following factors may make you more likely to develop this condition:  Being overweight.  Being elderly, especially over age 40.  Sitting or lying down for more than four hours.  Lack of physical activity (sedentary lifestyle).  Being pregnant, giving birth, or having recently given birth.  Taking medicines that contain estrogen.  Smoking.  A history of any of the following: ? Blood clots or blood clotting disease. ? Peripheral vascular disease. ? Inflammatory bowel disease. ? Cancer. ? Heart disease. ? Genetic conditions that affect how blood clots. ? Neurological diseases that affect the legs (leg paresis). ? Injury. ? Major or lengthy surgery. ? A central line placed inside a large vein.  What are the signs or symptoms? Symptoms of this condition include:  Swelling, pain, or tenderness in an arm or leg.  Warmth, redness, or discoloration in an arm or leg.  If the clot is in your leg, symptoms may be more noticeable or worse when you stand or walk. Some people do not have any symptoms. How is this diagnosed? This condition is diagnosed with:  A medical history.  A physical exam.  Tests, such as: ? Blood tests. These are done to see how your blood clots. ? Imaging tests. These are done to check for clots. Tests may include:  Ultrasound.  CT scan.  MRI.  X-ray.  Venogram. For this test, X-rays are taken after a dye is injected  into a vein.  How is this treated? Treatment for this condition depends on the cause, your risk for bleeding or developing more clots, and any medical conditions you have. Treatment may include:  Taking blood thinners (also called anticoagulants). These medicines may be taken by mouth, injected under the skin, or injected through an IV tube (catheter). These medicines prevent clots from forming.  Injecting medicine that dissolves blood clots into the affected vein (catheter-directed thrombolysis).  Having surgery. Surgery may be done to: ? Remove the clot. ? Place a filter in a large vein to catch blood clots before they reach the lungs.  Some treatments may be continued for up to six months. Follow these instructions at home: If you are taking an oral blood thinner:  Take the medicine exactly as told by your health care provider. Some blood thinners need to be taken at the same time every day. Do not skip a dose.  Ask your health care provider about what  foods and drugs interact with the medicine.  Ask about possible side effects. General instructions  Blood thinners can cause easy bruising and difficulty stopping bleeding. Because of this, if you are taking or were given a blood thinner: ? Hold pressure over cuts for longer than usual. ? Tell your dentist and other health care providers that you are taking blood thinners before having any procedures that can cause bleeding. ? Avoid contact sports.  Take over-the-counter and prescription medicines only as told by your health care provider.  Return to your normal activities as told by your health care provider. Ask your health care provider what activities are safe for you.  Wear compression stockings if recommended by your health care provider.  Keep all follow-up visits as told by your health care provider. This is important. How is this prevented? To lower your risk of developing this condition again:  For 30 or more minutes  every day, do an activity that: ? Involves moving your arms and legs. ? Increases your heart rate.  When traveling for longer than four hours: ? Exercise your arms and legs every hour. ? Drink plenty of water. ? Avoid drinking alcohol.  Avoid sitting or lying for a long time without moving your legs.  Stay a healthy weight.  If you are a woman who is older than age 19, avoid unnecessary use of medicines that contain estrogen.  Do not use any products that contain nicotine or tobacco, such as cigarettes and e-cigarettes. This is especially important if you take estrogen medicines. If you need help quitting, ask your health care provider.  Contact a health care provider if:  You miss a dose of your blood thinner.  You have nausea, vomiting, or diarrhea that lasts for more than one day.  Your menstrual period is heavier than usual.  You have unusual bruising. Get help right away if:  You have new or increased pain, swelling, or redness in an arm or leg.  You have numbness or tingling in an arm or leg.  You have shortness of breath.  You have chest pain.  You have a rapid or irregular heartbeat.  You feel light-headed or dizzy.  You cough up blood.  There is blood in your vomit, stool, or urine.  You have a serious fall or accident, or you hit your head.  You have a severe headache or confusion.  You have a cut that will not stop bleeding. These symptoms may represent a serious problem that is an emergency. Do not wait to see if the symptoms will go away. Get medical help right away. Call your local emergency services (911 in the [place] Do not drive yourself to the hospital. Summary  DVT is a condition in which a blood clot forms in a deep vein, such as a lower leg, thigh, or arm vein.  Symptoms can include swelling, warmth, pain, and redness in your leg or arm.  Treatment may include taking blood thinners, injecting medicine that dissolves blood clots,wearing  compression stockings, or surgery.  If you are prescribed blood thinners, take them exactly as told. This information is not intended to replace advice given to you by your health care provider. Make sure you discuss any questions you have with your health care provider. Document Released: 03/22/2005 Document Revised: 04/24/2016 Document Reviewed: 04/24/2016 Elsevier Interactive Patient Education  2018 [employer].

## 2017-10-10 NOTE — Progress Notes (Signed)
CC: 2-3 week history of bilateral leg pain, no swelling, left worse than right, recent right leg DVT and venous thrombectomy    History of Present Illness  Juan Johnston is a 67 y.o. male who presented to the emergency department on 07/20/2017 with pain and swelling in his right leg.  Ultrasound showed an acute DVT.  Later that day he underwent a venogram and intravascular ultrasound followed by placement of a lysis catheter.  Ultrasound showed nonocclusive thrombus in the common iliac vein was extended up into the inferior vena cava to at least the level of the renal veins.  Following day he went back for his follow-up he had mechanical thrombectomy of the inferior vena cava, right iliac system and right common femoral and femoral popliteal veins using the clotriever device on 07-21-17 by Dr. Donzetta Matters and Dr. Scot Dock.  He had an excellent result.  He was started on anticoagulation and discharged.  Dr. Trula Slade last evaluated pt on 08-15-17. At that time patient had a ileo-caval as well as femoral-popliteal DVT successfully treated.  He was not having any significant problems with edema. Dr. Trula Slade suspected that pt will need anticoagulation for 3-6 months. Dr. Trula Slade gave pt information regarding how to obtain compression stockings. Pt was to follow-up in 3 months with ileo-caval ultrasound.  Pt was scheduled for and IVC/Iliac venous duplex on 09-12-17, not done, pt states he knows nothing about this.   He returns today with c/o 2-3 week hx of pain in both legs, left worse than right, worse when supine, also sometimes when driving, states the pain is worsening. He denies any known hx of back problems.  He continues daily Xarelto.  He does not have dyspnea.   He has an appointment on 11-18-17 for IVC and iliac venous duplex and see me.   He does not have any swelling in his legs.  He does have some numbness in his toes.  His HIV medications had to be adjusted given that he was started on Xarelto.   He has not gotten a pair of compression stockings.   The patient is able to complete his activities of daily living.    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   For VQI Use Only  PRE-ADM LIVING: Home  AMB STATUS: Ambulatory    Past Medical History:  Diagnosis Date  . HIV infection ([place])   . Thyroid goiter     Past Surgical History:  Procedure Laterality Date  . INTRAVASCULAR ULTRASOUND/IVUS N/A 07/21/2017   Procedure: Intravascular Ultrasound/IVUS;  Surgeon: Waynetta Sandy, MD;  Location: [facility];  Service: Cardiovascular;  Laterality: N/A;  . PERIPHERAL VASCULAR THROMBECTOMY N/A 07/20/2017   Procedure: PERIPHERAL VASCULAR THROMBECTOMY;  Surgeon: Serafina Mitchell, MD;  Location: [facility];  Service: Cardiovascular;  Laterality: N/A;  . PERIPHERAL VASCULAR THROMBECTOMY N/A 07/21/2017   Procedure: PERIPHERAL VASCULAR THROMBECTOMY - LYSIS RECHECK;  Surgeon: Waynetta Sandy, MD;  Location: [facility];  Service: Cardiovascular;  Laterality: N/A;  . scrotum cyst      Family History  Problem Relation Age of Onset  . Hypertension Mother     Social History   Socioeconomic History  . Marital status: Single    Spouse name: Not on file  . Number of children: Not on file  . Years of education: Not on file  . Highest education level: Not on file  Occupational History  . Not on file  Social  Needs  . Financial resource strain: Not on file  . Food insecurity:    Worry: Not on file    Inability: Not on file  . Transportation needs:    Medical: Not on file    Non-medical: Not on file  Tobacco Use  . Smoking status: Current Some Day Smoker    Types: Cigarettes    Last attempt to quit: 02/03/2017    Years since quitting: 0.6  . Smokeless tobacco: Never Used  . Tobacco comment: 1 pk lasts 2 days. "Does not inhale"  Substance and Sexual Activity  . Alcohol use: Yes  . Drug use: No  . Sexual activity: Yes     Partners: Male    Birth control/protection: None  Lifestyle  . Physical activity:    Days per week: Not on file    Minutes per session: Not on file  . Stress: Not on file  Relationships  . Social connections:    Talks on phone: Not on file    Gets together: Not on file    Attends religious service: Not on file    Active member of club or organization: Not on file    Attends meetings of clubs or organizations: Not on file    Relationship status: Not on file  . Intimate partner violence:    Fear of current or ex partner: Not on file    Emotionally abused: Not on file    Physically abused: Not on file    Forced sexual activity: Not on file  Other Topics Concern  . Not on file  Social History Narrative  . Not on file    No Known Allergies  Current Outpatient Medications on File Prior to Visit  Medication Sig Dispense Refill  . aspirin 81 MG chewable tablet Chew 1 tablet (81 mg total) by mouth daily. 30 tablet 0  . bictegravir-emtricitabine-tenofovir AF (BIKTARVY) 50-200-25 MG TABS tablet Take 1 tablet by mouth daily. 30 tablet 2  . Rivaroxaban 15 & 20 MG TBPK Take 20 mg by mouth daily. Take 20 mg once a day. 30 each 6  . sulfamethoxazole-trimethoprim (BACTRIM DS,SEPTRA DS) 800-160 MG tablet Take 1 tablet by mouth daily.    Marland Kitchen acetaminophen (TYLENOL) 325 MG tablet Take 2 tablets (650 mg total) by mouth every 6 (six) hours as needed for mild pain (or Fever >/= 101). (Patient not taking: Reported on 10/10/2017) 30 tablet 0  . diphenhydrAMINE (BENADRYL) 25 MG tablet Take 25 mg by mouth every 6 (six) hours as needed for itching or allergies.      No current facility-administered medications on file prior to visit.       Physical Examination  Vitals:   10/10/17 0928  BP: 126/81  Pulse: 74  Resp: 16  Temp: (!) 97.2 F (36.2 C)  TempSrc: Oral  SpO2: 96%  Weight: 169 lb (76.7 kg)  Height: 5\' 7"  (1.702 m)   Body mass index is 26.47 kg/m.  PHYSICAL EXAMINATION: General: The  patient appears his stated age.   HEENT:  No gross abnormalities Pulmonary: Respirations are non-labored, CTAB, adequate air movement in all fields. Abdomen: Soft and non-tender with normal bowel sounds. Musculoskeletal: There are no major deformities.   Neurologic: No focal weakness or paresthesias are detected. + SLR test, both legs, pain elicited in posterior and lateral thighs and buttocks.  Skin: There are no ulcer or rashes noted, no cellulitis. Psychiatric: The patient has normal affect. Cardiovascular: There is a regular rate and rhythm without significant  murmur appreciated. No swelling of legs.   Vascular: Vessel Right Left  Radial 2+Palpable 2+Palpable  Ulnar Not Palpable Not Palpable  Brachial 2+Palpable 2+Palpable  Carotid Palpable, without bruit Palpable, without bruit  Aorta Not palpable N/A  Femoral 2+Palpable 2+Palpable  Popliteal Not palpable Not palpable  PT Not  Palpable Not  Palpable  DP 2+ Palpable 2+ Palpable    Medical Decision Making  Juan Johnston is a 67 y.o. male who presents s/p mechanical thrombectomy of the inferior vena cava, right iliac system and right common femoral and femoral popliteal veins using the clotriever device on 07-21-17 by Dr. Donzetta Matters and Dr. Scot Dock.  He had an excellent result.  He was started on anticoagulation and discharged.  He returns with 2-3 week hx of pain, no swelling, in both thighs and buttocks when supine or sitting. Pain is worse in the left leg. He continues to smoke. He does not have DM. He is not dyspneic. He has been faithful about taking his daily Xarleto.  He has not been on any long car rides nor long periods of not walking.  He has a positive bilateral straight leg raise test with pain elicited in posterior and lateral thighs and buttocks.  He has no known back problems.       After discussing with Dr. Scot Dock, pt to return tomorrow with venous duplex of IVC, bilateral iliac veins, bilateral leg veins, see me  afterward. Dr. Donzetta Matters has no availability this week, I have no availability when Dr. Donzetta Matters is here this Friday.   Over 3 minutes was spent counseling patient re smoking cessation, and patient was given several free resources re smoking cessation.   I discussed in depth with the patient the nature of atherosclerosis, and emphasized the importance of maximal medical management including strict control of blood pressure, blood glucose, and lipid levels, obtaining regular exercise, and cessation of smoking.  The patient is aware that without maximal medical management the underlying atherosclerotic disease process will progress, limiting the benefit of any interventions.   Thank you for allowing [place] to participate in this patient's care.  Clemon Chambers, RN, MSN, FNP-C [facility] Office: [phone]  10/10/2017, 5:34 PM  Clinic MD: Trula Slade

## 2017-10-11 ENCOUNTER — Other Ambulatory Visit: Payer: Self-pay | Admitting: *Deleted

## 2017-10-11 ENCOUNTER — Ambulatory Visit (HOSPITAL_COMMUNITY)
Admission: RE | Admit: 2017-10-11 | Discharge: 2017-10-11 | Disposition: A | Discharge status: Home / Self Care | Payer: Medicare Other | Source: Ambulatory Visit | Attending: Family | Admitting: Family

## 2017-10-11 ENCOUNTER — Ambulatory Visit (INDEPENDENT_AMBULATORY_CARE_PROVIDER_SITE_OTHER): Payer: Medicare Other | Admitting: Family

## 2017-10-11 ENCOUNTER — Encounter: Payer: Self-pay | Admitting: Family

## 2017-10-11 ENCOUNTER — Ambulatory Visit (INDEPENDENT_AMBULATORY_CARE_PROVIDER_SITE_OTHER)
Admission: RE | Admit: 2017-10-11 | Discharge: 2017-10-11 | Disposition: A | Discharge status: Home / Self Care | Payer: Medicare Other | Source: Ambulatory Visit | Attending: Family | Admitting: Family

## 2017-10-11 VITALS — BP 111/79 | HR 79 | Temp 97.1°F | Resp 16 | Ht 67.0 in | Wt 168.1 lb

## 2017-10-11 DIAGNOSIS — I824Z3 Acute embolism and thrombosis of unspecified deep veins of distal lower extremity, bilateral: Secondary | ICD-10-CM

## 2017-10-11 DIAGNOSIS — M79605 Pain in left leg: Secondary | ICD-10-CM

## 2017-10-11 DIAGNOSIS — I825Z3 Chronic embolism and thrombosis of unspecified deep veins of distal lower extremity, bilateral: Secondary | ICD-10-CM | POA: Diagnosis not present

## 2017-10-11 DIAGNOSIS — I82523 Chronic embolism and thrombosis of iliac vein, bilateral: Secondary | ICD-10-CM

## 2017-10-11 DIAGNOSIS — M79604 Pain in right leg: Secondary | ICD-10-CM

## 2017-10-11 DIAGNOSIS — F17200 Nicotine dependence, unspecified, uncomplicated: Secondary | ICD-10-CM | POA: Diagnosis not present

## 2017-10-11 NOTE — Patient Instructions (Signed)
Steps to Quit Smoking Smoking tobacco can be bad for your health. It can also affect almost every organ in your body. Smoking puts you and people around you at risk for many serious long-lasting (chronic) diseases. Quitting smoking is hard, but it is one of the best things that you can do for your health. It is never too late to quit. What are the benefits of quitting smoking? When you quit smoking, you lower your risk for getting serious diseases and conditions. They can include:  Lung cancer or lung disease.  Heart disease.  Stroke.  Heart attack.  Not being able to have children (infertility).  Weak bones (osteoporosis) and broken bones (fractures).  If you have coughing, wheezing, and shortness of breath, those symptoms may get better when you quit. You may also get sick less often. If you are pregnant, quitting smoking can help to lower your chances of having a baby of low birth weight. What can I do to help me quit smoking? Talk with your doctor about what can help you quit smoking. Some things you can do (strategies) include:  Quitting smoking totally, instead of slowly cutting back how much you smoke over a period of time.  Going to in-person counseling. You are more likely to quit if you go to many counseling sessions.  Using resources and support systems, such as: ? Online chats with a counselor. ? Phone quitlines. ? Printed self-help materials. ? Support groups or group counseling. ? Text messaging programs. ? Mobile phone apps or applications.  Taking medicines. Some of these medicines may have nicotine in them. If you are pregnant or breastfeeding, do not take any medicines to quit smoking unless your doctor says it is okay. Talk with your doctor about counseling or other things that can help you.  Talk with your doctor about using more than one strategy at the same time, such as taking medicines while you are also going to in-person counseling. This can help make  quitting easier. What things can I do to make it easier to quit? Quitting smoking might feel very hard at first, but there is a lot that you can do to make it easier. Take these steps:  Talk to your family and friends. Ask them to support and encourage you.  Call phone quitlines, reach out to support groups, or work with a counselor.  Ask people who smoke to not smoke around you.  Avoid places that make you want (trigger) to smoke, such as: ? Bars. ? Parties. ? Smoke-break areas at work.  Spend time with people who do not smoke.  Lower the stress in your life. Stress can make you want to smoke. Try these things to help your stress: ? Getting regular exercise. ? Deep-breathing exercises. ? Yoga. ? Meditating. ? Doing a body scan. To do this, close your eyes, focus on one area of your body at a time from head to toe, and notice which parts of your body are tense. Try to relax the muscles in those areas.  Download or buy apps on your mobile phone or tablet that can help you stick to your quit plan. There are many free apps, such as QuitGuide from the CDC (Centers for Disease Control and Prevention). You can find more support from smokefree.gov and other websites.  This information is not intended to replace advice given to you by your health care provider. Make sure you discuss any questions you have with your health care provider. Document Released: 01/16/2009 Document   Revised: 11/18/2015 Document Reviewed: 08/06/2014 Elsevier Interactive Patient Education  2018 [place].     Deep Vein Thrombosis Deep vein thrombosis (DVT) is a condition in which a blood clot forms in a deep vein, such as a lower leg, thigh, or arm vein. A clot is blood that has thickened into a gel or solid. This condition is dangerous. It can lead to serious and even life-threatening complications if the clot travels to the lungs and causes a blockage (pulmonary embolism). It can also damage veins in the leg.  This can result in leg pain, swelling, discoloration, and sores (post-thrombotic syndrome). What are the causes? This condition may be caused by:  A slowdown of blood flow.  Damage to a vein.  A condition that makes blood clot more easily.  What increases the risk? The following factors may make you more likely to develop this condition:  Being overweight.  Being elderly, especially over age 39.  Sitting or lying down for more than four hours.  Lack of physical activity (sedentary lifestyle).  Being pregnant, giving birth, or having recently given birth.  Taking medicines that contain estrogen.  Smoking.  A history of any of the following: ? Blood clots or blood clotting disease. ? Peripheral vascular disease. ? Inflammatory bowel disease. ? Cancer. ? Heart disease. ? Genetic conditions that affect how blood clots. ? Neurological diseases that affect the legs (leg paresis). ? Injury. ? Major or lengthy surgery. ? A central line placed inside a large vein.  What are the signs or symptoms? Symptoms of this condition include:  Swelling, pain, or tenderness in an arm or leg.  Warmth, redness, or discoloration in an arm or leg.  If the clot is in your leg, symptoms may be more noticeable or worse when you stand or walk. Some people do not have any symptoms. How is this diagnosed? This condition is diagnosed with:  A medical history.  A physical exam.  Tests, such as: ? Blood tests. These are done to see how your blood clots. ? Imaging tests. These are done to check for clots. Tests may include:  Ultrasound.  CT scan.  MRI.  X-ray.  Venogram. For this test, X-rays are taken after a dye is injected into a vein.  How is this treated? Treatment for this condition depends on the cause, your risk for bleeding or developing more clots, and any medical conditions you have. Treatment may include:  Taking blood thinners (also called anticoagulants). These  medicines may be taken by mouth, injected under the skin, or injected through an IV tube (catheter). These medicines prevent clots from forming.  Injecting medicine that dissolves blood clots into the affected vein (catheter-directed thrombolysis).  Having surgery. Surgery may be done to: ? Remove the clot. ? Place a filter in a large vein to catch blood clots before they reach the lungs.  Some treatments may be continued for up to six months. Follow these instructions at home: If you are taking an oral blood thinner:  Take the medicine exactly as told by your health care provider. Some blood thinners need to be taken at the same time every day. Do not skip a dose.  Ask your health care provider about what foods and drugs interact with the medicine.  Ask about possible side effects. General instructions  Blood thinners can cause easy bruising and difficulty stopping bleeding. Because of this, if you are taking or were given a blood thinner: ? Hold pressure over cuts for longer  than usual. ? Tell your dentist and other health care providers that you are taking blood thinners before having any procedures that can cause bleeding. ? Avoid contact sports.  Take over-the-counter and prescription medicines only as told by your health care provider.  Return to your normal activities as told by your health care provider. Ask your health care provider what activities are safe for you.  Wear compression stockings if recommended by your health care provider.  Keep all follow-up visits as told by your health care provider. This is important. How is this prevented? To lower your risk of developing this condition again:  For 30 or more minutes every day, do an activity that: ? Involves moving your arms and legs. ? Increases your heart rate.  When traveling for longer than four hours: ? Exercise your arms and legs every hour. ? Drink plenty of water. ? Avoid drinking alcohol.  Avoid  sitting or lying for a long time without moving your legs.  Stay a healthy weight.  If you are a woman who is older than age 5, avoid unnecessary use of medicines that contain estrogen.  Do not use any products that contain nicotine or tobacco, such as cigarettes and e-cigarettes. This is especially important if you take estrogen medicines. If you need help quitting, ask your health care provider.  Contact a health care provider if:  You miss a dose of your blood thinner.  You have nausea, vomiting, or diarrhea that lasts for more than one day.  Your menstrual period is heavier than usual.  You have unusual bruising. Get help right away if:  You have new or increased pain, swelling, or redness in an arm or leg.  You have numbness or tingling in an arm or leg.  You have shortness of breath.  You have chest pain.  You have a rapid or irregular heartbeat.  You feel light-headed or dizzy.  You cough up blood.  There is blood in your vomit, stool, or urine.  You have a serious fall or accident, or you hit your head.  You have a severe headache or confusion.  You have a cut that will not stop bleeding. These symptoms may represent a serious problem that is an emergency. Do not wait to see if the symptoms will go away. Get medical help right away. Call your local emergency services (911 in the [place] Do not drive yourself to the hospital. Summary  DVT is a condition in which a blood clot forms in a deep vein, such as a lower leg, thigh, or arm vein.  Symptoms can include swelling, warmth, pain, and redness in your leg or arm.  Treatment may include taking blood thinners, injecting medicine that dissolves blood clots,wearing compression stockings, or surgery.  If you are prescribed blood thinners, take them exactly as told. This information is not intended to replace advice given to you by your health care provider. Make sure you discuss any questions you have with your  health care provider. Document Released: 03/22/2005 Document Revised: 04/24/2016 Document Reviewed: 04/24/2016 Elsevier Interactive Patient Education  2018 [employer].

## 2017-10-11 NOTE — Progress Notes (Signed)
CC: 2-3 week history of bilateral leg pain, no swelling, left worse than right, recent right leg DVT and venous thrombectomy    History of Present Illness  Juan Johnston is a 67 y.o. male who presented to the emergency department on 07/20/2017 with pain and swelling in his right leg. Ultrasound showed an acute DVT. Later that day he underwent a venogram and intravascular ultrasound followed by placement of a lysis catheter. Ultrasound showed nonocclusive thrombus in the common iliac vein was extended up into the inferior vena cava to at least the level of the renal veins. Following day he went back for his follow-up he had mechanical thrombectomy of the inferior vena cava, right iliac system and right common femoral and femoral popliteal veins using the clotriever device on 07-21-17 by Dr. Donzetta Matters and Dr. Scot Dock. He had an excellent result. He was started on anticoagulation and discharged.  Dr. Trula Slade last evaluated pt on 08-15-17. At that time patient had a ileo-caval as well as femoral-popliteal DVT successfully treated. He was not having any significant problems with edema. Dr. Trula Slade suspected that pt will need anticoagulation for 3-6 months. Dr. Trula Slade gave pt information regarding how to obtain compression stockings. Pt was to follow-up in 3 months with ileo-caval ultrasound.  Pt was scheduled for and IVC/Iliac venous duplex on 09-12-17, not done, pt states he knows nothing about this.   He returns today with c/o 2-3 week hx of pain in both legs, left worse than right, worse when supine, also sometimes when driving, states the pain is worsening. He denies any known hx of back problems.  He continues daily Xarelto.  He does not have dyspnea.   He has an appointment on 11-18-17 for IVC and iliac venous duplex and see me.   He does not have any swelling in his legs. He does have some numbness in his toes. His HIV medications had to be adjusted given that he was started on  Xarelto. He has not gotten a pair of compression stockings.   The patient is able to complete his activities of daily living.    DM: he denies Tobacco use: 1/3 ppd, started about age 66.   For VQI Use Only  PRE-ADM LIVING: Home  AMB STATUS: Ambulatory        Past Medical History:  Diagnosis Date  . HIV infection ([place])   . Thyroid goiter          Past Surgical History:  Procedure Laterality Date  . INTRAVASCULAR ULTRASOUND/IVUS N/A 07/21/2017   Procedure: Intravascular Ultrasound/IVUS;  Surgeon: Waynetta Sandy, MD;  Location: [facility];  Service: Cardiovascular;  Laterality: N/A;  . PERIPHERAL VASCULAR THROMBECTOMY N/A 07/20/2017   Procedure: PERIPHERAL VASCULAR THROMBECTOMY;  Surgeon: Serafina Mitchell, MD;  Location: [facility];  Service: Cardiovascular;  Laterality: N/A;  . PERIPHERAL VASCULAR THROMBECTOMY N/A 07/21/2017   Procedure: PERIPHERAL VASCULAR THROMBECTOMY - LYSIS RECHECK;  Surgeon: Waynetta Sandy, MD;  Location: [facility];  Service: Cardiovascular;  Laterality: N/A;  . scrotum cyst           Family History  Problem Relation Age of Onset  . Hypertension Mother     Social History        Socioeconomic History  . Marital status: Single    Spouse name: Not on file  . Number of children: Not on file  . Years of education: Not on file  . Highest education level: Not on file  Occupational History  . Not on file  Social Needs  . Financial resource strain: Not on file  . Food insecurity:    Worry: Not on file    Inability: Not on file  . Transportation needs:    Medical: Not on file    Non-medical: Not on file  Tobacco Use  . Smoking status: Current Some Day Smoker    Types: Cigarettes    Last attempt to quit: 02/03/2017    Years since quitting: 0.6  . Smokeless tobacco: Never Used  . Tobacco comment: 1 pk lasts 2 days. "Does not inhale"  Substance and Sexual  Activity  . Alcohol use: Yes  . Drug use: No  . Sexual activity: Yes    Partners: Male    Birth control/protection: None  Lifestyle  . Physical activity:    Days per week: Not on file    Minutes per session: Not on file  . Stress: Not on file  Relationships  . Social connections:    Talks on phone: Not on file    Gets together: Not on file    Attends religious service: Not on file    Active member of club or organization: Not on file    Attends meetings of clubs or organizations: Not on file    Relationship status: Not on file  . Intimate partner violence:    Fear of current or ex partner: Not on file    Emotionally abused: Not on file    Physically abused: Not on file    Forced sexual activity: Not on file  Other Topics Concern  . Not on file  Social History Narrative  . Not on file    No Known Allergies        Current Outpatient Medications on File Prior to Visit  Medication Sig Dispense Refill  . aspirin 81 MG chewable tablet Chew 1 tablet (81 mg total) by mouth daily. 30 tablet 0  . bictegravir-emtricitabine-tenofovir AF (BIKTARVY) 50-200-25 MG TABS tablet Take 1 tablet by mouth daily. 30 tablet 2  . Rivaroxaban 15 & 20 MG TBPK Take 20 mg by mouth daily. Take 20 mg once a day. 30 each 6  . sulfamethoxazole-trimethoprim (BACTRIM DS,SEPTRA DS) 800-160 MG tablet Take 1 tablet by mouth daily.    Marland Kitchen acetaminophen (TYLENOL) 325 MG tablet Take 2 tablets (650 mg total) by mouth every 6 (six) hours as needed for mild pain (or Fever >/= 101). (Patient not taking: Reported on 10/10/2017) 30 tablet 0  . diphenhydrAMINE (BENADRYL) 25 MG tablet Take 25 mg by mouth every 6 (six) hours as needed for itching or allergies.      No current facility-administered medications on file prior to visit.       Physical Examination     Vitals:   10/10/17 0928  BP: 126/81  Pulse: 74  Resp: 16  Temp: (!) 97.2 F (36.2 C)  TempSrc: Oral  SpO2: 96%   Weight: 169 lb (76.7 kg)  Height: 5\' 7"  (1.702 m)   Body mass index is 26.47 kg/m.  PHYSICAL EXAMINATION: General: The patient appears his stated age.   HEENT:  No gross abnormalities Pulmonary: Respirations are non-labored, CTAB, adequate air movement in all fields. Abdomen: Soft and non-tender with normal bowel sounds. Musculoskeletal: There are no major deformities.   Neurologic: No focal weakness or paresthesias are detected. + SLR test, both legs, pain elicited in posterior and lateral thighs and buttocks.  Skin: There are no ulcer or rashes noted,  no cellulitis. Psychiatric: The patient has normal affect. Cardiovascular: There is a regular rate and rhythm without significant murmur appreciated. No swelling of legs.   Vascular: Vessel Right Left  Radial 2+Palpable 2+Palpable  Ulnar Not Palpable Not Palpable  Brachial 2+Palpable 2+Palpable  Carotid Palpable, without bruit Palpable, without bruit  Aorta Not palpable N/A  Femoral 2+Palpable 2+Palpable  Popliteal Not palpable Not palpable  PT Not  Palpable Not  Palpable  DP 2+ Palpable 2+ Palpable     DATA  IVC Venous Duplex (10-11-17): Visualization of the mid and distal IVC, and proximal common iliac vein was limited due to overlying bowel gas. The mid to distal EIV demonstrates chronic appearing, partially obstructive thrombus.  Bilateral LE Venous Duplex (10-11-17):  Right: Evidence of continued obstruction in the femoral vein, popliteal vein, posterior tibial veins, and peroneal veins.  Abnormalities consistent with the sequela of a prior venous obstructive process, with findings that appear chronic/long standing in nature in the femoral vein, popliteal vein, poseterior tibial veins, and peroneal veins.   Left: Evidence of continued obstruction in the common femoral vein, femoral vein, popliteal vein, posterior tibial veins, and peroneal veins.  Abnormalities consistent with the sequela of a prior venous  obstructive process, with findings that appear chronic/long standing in nature are identified in the common femoral vein, femoral vein, popliteal vein, posterior tibial veins, and peroneal veins.   Medical Decision Making  Juan Johnston is a 67 y.o. male who presents s/p mechanical thrombectomy of the inferior vena cava, right iliac system and right common femoral and femoral popliteal veins using the clotriever device on 07-21-17 by Dr. Donzetta Matters and Dr. Scot Dock. He had an excellent result. He was started on anticoagulation and discharged.  He returns with 2-3 week hx of pain, no swelling, in both thighs and buttocks when supine or sitting. Pain is worse in the left leg. He continues to smoke. He does not have DM. He is not dyspneic. He has been faithful about taking his daily Xarleto.  He has not been on any long car rides nor long periods of not walking.   He has a positive bilateral straight leg raise test with pain elicited in posterior and lateral thighs and buttocks.  He has no known back problems. I advised pt to speak with his PCP re a possible lumbar spine evaluation.  His A1C was 5.8 in December 2018; he has numbness in his feet which is not likely to be related to DVT.    I spoke with Dr. Donnetta Hutching re pt continuing on Xarelto, HPI, physical exam findings, and chronic DVT findings on duplex today. He advised that I discuss with Dr. Donzetta Matters whether he thinks these extensive chronic DVT's are amenable to venous intervention. I will communicate with Dr. Donzetta Matters re this.    Follow up in 3 months with IVC duplex, bilateral iliac vein duplex, and bilateral LE vein duplex, and see Dr. Donzetta Matters afterward.    Over 3 minutes was spent counseling patient re smoking cessation, and patient was given several free resources re smoking cessation.    I discussed in depth with the patient the nature of atherosclerosis, and emphasized the importance of maximal medical management including strict control of blood  pressure, blood glucose, and lipid levels, obtaining regular exercise, and cessation of smoking.  The patient is aware that without maximal medical management the underlying atherosclerotic disease process will progress, limiting the benefit of any interventions.   Thank you for allowing [place] to participate in this  patient's care.  Clemon Chambers, RN, MSN, FNP-C [facility] Office: [phone]  10/11/2017, 12:53 pm  Clinic MD: Early

## 2017-10-11 NOTE — Progress Notes (Signed)
Patient called to confirm that he should NOT be taking prezcobix/descovy any longer, that he is only on Biktarvy. RN confirmed, removed these historic medications from his chart. Landis Gandy, RN

## 2017-10-12 ENCOUNTER — Other Ambulatory Visit: Payer: Self-pay

## 2017-10-12 DIAGNOSIS — I82523 Chronic embolism and thrombosis of iliac vein, bilateral: Secondary | ICD-10-CM

## 2017-10-12 DIAGNOSIS — M79605 Pain in left leg: Secondary | ICD-10-CM

## 2017-10-12 DIAGNOSIS — I824Z3 Acute embolism and thrombosis of unspecified deep veins of distal lower extremity, bilateral: Secondary | ICD-10-CM

## 2017-10-12 DIAGNOSIS — M79604 Pain in right leg: Secondary | ICD-10-CM

## 2017-10-24 ENCOUNTER — Encounter: Payer: Self-pay | Admitting: Infectious Diseases

## 2017-10-24 ENCOUNTER — Ambulatory Visit (INDEPENDENT_AMBULATORY_CARE_PROVIDER_SITE_OTHER): Payer: Medicare Other | Admitting: Infectious Diseases

## 2017-10-24 VITALS — BP 132/78 | HR 80 | Temp 98.6°F | Wt 167.0 lb

## 2017-10-24 DIAGNOSIS — B20 Human immunodeficiency virus [HIV] disease: Secondary | ICD-10-CM | POA: Diagnosis present

## 2017-10-24 DIAGNOSIS — L731 Pseudofolliculitis barbae: Secondary | ICD-10-CM | POA: Diagnosis not present

## 2017-10-24 DIAGNOSIS — Z23 Encounter for immunization: Secondary | ICD-10-CM

## 2017-10-24 DIAGNOSIS — Z0000 Encounter for general adult medical examination without abnormal findings: Secondary | ICD-10-CM | POA: Diagnosis not present

## 2017-10-24 DIAGNOSIS — I82501 Chronic embolism and thrombosis of unspecified deep veins of right lower extremity: Secondary | ICD-10-CM | POA: Diagnosis not present

## 2017-10-24 DIAGNOSIS — L738 Other specified follicular disorders: Secondary | ICD-10-CM

## 2017-10-24 DIAGNOSIS — Z21 Asymptomatic human immunodeficiency virus [HIV] infection status: Secondary | ICD-10-CM | POA: Diagnosis not present

## 2017-10-24 DIAGNOSIS — D7218 Eosinophilia in diseases classified elsewhere: Secondary | ICD-10-CM

## 2017-10-24 NOTE — Assessment & Plan Note (Signed)
No complaints - seems to be resolved with consistent HAART.

## 2017-10-24 NOTE — Patient Instructions (Addendum)
You are undetectable! This tells me that your medication is working very well and you are doing a great job taking it.   Please continue taking your Bactrim once a day until your next appointment with me as well.   Good luck with your procedure later this month.   Ask your primary care provider about your blood sugars - back in December it looks as if you were Pre Diabetic.   Please come back in 3 months with labs prior to your visit.    Pneumococcal Conjugate Vaccine suspension for injection What is this medicine? PNEUMOCOCCAL VACCINE (NEU mo KOK al vak SEEN) is a vaccine used to prevent pneumococcus bacterial infections. These bacteria can cause serious infections like pneumonia, meningitis, and blood infections. This vaccine will lower your chance of getting pneumonia. If you do get pneumonia, it can make your symptoms milder and your illness shorter. This vaccine will not treat an infection and will not cause infection. This vaccine is recommended for infants and young children, adults with certain medical conditions, and adults 82 years or older. This medicine may be used for other purposes; ask your health care provider or pharmacist if you have questions. COMMON BRAND NAME(S): Prevnar, Prevnar 13 What should I tell my health care provider before I take this medicine? They need to know if you have any of these conditions: -bleeding problems -fever -immune system problems -an unusual or allergic reaction to pneumococcal vaccine, diphtheria toxoid, other vaccines, latex, other medicines, foods, dyes, or preservatives -pregnant or trying to get pregnant -breast-feeding How should I use this medicine? This vaccine is for injection into a muscle. It is given by a health care professional. A copy of Vaccine Information Statements will be given before each vaccination. Read this sheet carefully each time. The sheet may change frequently. Talk to your pediatrician regarding the use of this  medicine in children. While this drug may be prescribed for children as young as 26 weeks old for selected conditions, precautions do apply. Overdosage: If you think you have taken too much of this medicine contact a poison control center or emergency room at once. NOTE: This medicine is only for you. Do not share this medicine with others. What if I miss a dose? It is important not to miss your dose. Call your doctor or health care professional if you are unable to keep an appointment. What may interact with this medicine? -medicines for cancer chemotherapy -medicines that suppress your immune function -steroid medicines like prednisone or cortisone This list may not describe all possible interactions. Give your health care provider a list of all the medicines, herbs, non-prescription drugs, or dietary supplements you use. Also tell them if you smoke, drink alcohol, or use illegal drugs. Some items may interact with your medicine. What should I watch for while using this medicine? Mild fever and pain should go away in 3 days or less. Report any unusual symptoms to your doctor or health care professional. What side effects may I notice from receiving this medicine? Side effects that you should report to your doctor or health care professional as soon as possible: -allergic reactions like skin rash, itching or hives, swelling of the face, lips, or tongue -breathing problems -confused -fast or irregular heartbeat -fever over 102 degrees F -seizures -unusual bleeding or bruising -unusual muscle weakness Side effects that usually do not require medical attention (report to your doctor or health care professional if they continue or are bothersome): -aches and pains -diarrhea -fever of  102 degrees F or less -headache -irritable -loss of appetite -pain, tender at site where injected -trouble sleeping This list may not describe all possible side effects. Call your doctor for medical advice  about side effects. You may report side effects to FDA at 1-800-FDA-1088. Where should I keep my medicine? This does not apply. This vaccine is given in a clinic, pharmacy, doctor's office, or other health care setting and will not be stored at home. NOTE: This sheet is a summary. It may not cover all possible information. If you have questions about this medicine, talk to your doctor, pharmacist, or health care provider.  2018 Elsevier/Gold Standard (2013-12-27 10:27:27)

## 2017-10-24 NOTE — Assessment & Plan Note (Addendum)
Discussed recent viral load being undetectable and current CD4 of 180. Congratulated him and discussed what this means. He was very pleased. I would like for him to continue on his Biktarvy once a day as he would be someone to keep off cobicistat with co morbidities if possible. I recommended to continue Bactrim QD until our next visit in 3 months when we check labs again - likely will be able to stop at that time.

## 2017-10-24 NOTE — Progress Notes (Signed)
Name:Juan Johnston  DOB: Dec 21, 1950  MRN: [id number]  PCP: [name], PA-C   Chief Complaint  Patient presents with  . Follow-up   Patient Active Problem List   Diagnosis Date Noted  . Leg DVT (deep venous thromboembolism), chronic, right ([place]) 07/20/2017  . HIV (human immunodeficiency virus infection) ([place]) 07/19/2017  . GERD (gastroesophageal reflux disease) 07/19/2017  . Cough 07/15/2017  . 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 Thyroid goiter 05/19/2017  . AIDS (acquired immune deficiency syndrome) ([place]) 04/21/2017  . Exposure to hepatitis C 04/21/2017  . Healthcare maintenance 04/21/2017  . Hyperlipidemia 04/21/2017    Brief Narrative:  Brief Narrative:  Juan Johnston is a 67 y.o. AA male with HIV infection diagnosed 03-2017. His CD4 nadir was 70 and was with AIDS defining illness (esophageal candidiasis/oral thrush). HIV Risk: MSM / HIV+ partner. OI Hx: esophageal candidiasis.   Previous Regimen:   Prezcobix + Descovy 2018 (stopped d/t DDI with NOAC)  Biktarvy 2019 --> undetectable   Genotype:   04-2017: K103N, E138G (R-nevirapine, efavirenz; I-rilpivirine)   Subjective:   HPI:  Juan Johnston is here for routine follow up for HIV/AIDS disease. Interval history noted for hospitalization for DVT in April of this year. He is working with vascular surgery team and required thrombectomy on 4/17 & 07/21/17 and has been placed on Xarelto for this of which he continues to take. He is worried about pain that he describes to be sharp/crampy behind his calves when he walks; rest after a while will ease it off. Does also describe some hip pain bilaterally and sometimes some lower back stiffness. No increased swelling or numbness/tingling in legs/feet. He would like to know if safe to take Tylenol with his HIV medication for pain control. He reports that he likes his Biktarvy and finds it is easy to take. He has not missed a dose of this since we switched him 3 months ago. He has more  energy since staying on HAART and notices some weight gain. He is worried about procedure he will have in a few weeks (can't recall what it is) and if he has diabetes.   Review of Systems  Constitutional: Negative for chills, fever, malaise/fatigue and weight loss.  HENT: Negative for sore throat.   Respiratory: Negative for hemoptysis, sputum production, shortness of breath and wheezing. Cough: occasionally in the AM.   Cardiovascular: Negative for chest pain and leg swelling.  Gastrointestinal: Negative for abdominal pain, diarrhea and vomiting.  Genitourinary: Negative for dysuria and flank pain.  Musculoskeletal: Negative for joint pain, myalgias and neck pain.  Skin: Negative for itching and rash.  Neurological: Negative for dizziness, tingling and headaches.  Psychiatric/Behavioral: Negative for depression and substance abuse. The patient is not nervous/anxious and does not have insomnia.     Past Medical History:  Diagnosis Date  . HIV infection ([place])   . Thyroid goiter    Outpatient Medications Prior to Visit  Medication Sig Dispense Refill  . acetaminophen (TYLENOL) 325 MG tablet Take 2 tablets (650 mg total) by mouth every 6 (six) hours as needed for mild pain (or Fever >/= 101). 30 tablet 0  . aspirin 81 MG chewable tablet Chew 1 tablet (81 mg total) by mouth daily. 30 tablet 0  . bictegravir-emtricitabine-tenofovir AF (BIKTARVY) 50-200-25 MG TABS tablet Take 1 tablet by mouth daily. 30 tablet 2  . diphenhydrAMINE (BENADRYL) 25 MG tablet Take 25 mg by mouth every 6 (six) hours as needed for itching or allergies.     Marland Kitchen  Rivaroxaban 15 & 20 MG TBPK Take 20 mg by mouth daily. Take 20 mg once a day. 30 each 6  . sulfamethoxazole-trimethoprim (BACTRIM DS,SEPTRA DS) 800-160 MG tablet Take 1 tablet by mouth daily.     No facility-administered medications prior to visit.    No Known Allergies  Social History   Tobacco Use  . Smoking status: Current Some Day Smoker    Types:  Cigarettes    Last attempt to quit: 02/03/2017    Years since quitting: 0.7  . Smokeless tobacco: Never Used  . Tobacco comment: 1 pk lasts 2 days. "Does not inhale"  Substance Use Topics  . Alcohol use: Yes  . Drug use: No    Family History  Problem Relation Age of Onset  . Hypertension Mother     Social History   Substance and Sexual Activity  Sexual Activity Yes  . Partners: Male  . Birth control/protection: None    Objective:   Vitals:   10/24/17 1130  BP: 132/78  Pulse: 80  Temp: 98.6 F (37 C)  TempSrc: Oral  Weight: 167 lb (75.8 kg)   Body mass index is 26.16 kg/m.   Physical Exam  Constitutional: He is oriented to person, place, and time and well-developed, well-nourished, and in no distress.  Pleasant gentleman seated comfortably in the chair. Appropriately dressed and well-roomed.   HENT:  Mouth/Throat: Oropharynx is clear and moist. No oral lesions. Normal dentition. No dental caries.  Dental carries noted   Eyes: Pupils are equal, round, and reactive to light. No scleral icterus.  Neck: Thyromegaly (goiter present ) present.  Cardiovascular: Normal rate, regular rhythm, normal heart sounds and intact distal pulses.  No murmur heard. Pulses:      Radial pulses are 2+ on the right side, and 2+ on the left side.       Dorsalis pedis pulses are 2+ on the right side, and 2+ on the left side.  Pulmonary/Chest: Effort normal and breath sounds normal. No respiratory distress. He has no wheezes. He has no rales.  Dry cough observed intermittently   Abdominal: Soft. He exhibits no distension. There is no tenderness.  Musculoskeletal: Normal range of motion.  Negative Homan's sign R and L. No swelling present to either LE. No warmth or tenderness on exam over calves bilaterally.  Straight leg raise negative. ROM of lower back, hips and knees as expected with no limitation.   Lymphadenopathy:    He has no cervical adenopathy.  Neurological: He is alert and  oriented to person, place, and time. Gait normal.  Skin: Skin is warm and dry. No rash noted.  Psychiatric: Mood and affect normal.  Vitals reviewed.  Lab Results Lab Results  Component Value Date   WBC 3.3 (L) 07/23/2017   HGB 11.8 (L) 07/23/2017   HCT 35.0 (L) 07/23/2017   MCV 93.1 07/23/2017   PLT 154 07/23/2017    Lab Results  Component Value Date   CREATININE 0.81 07/23/2017   BUN 5 (L) 07/23/2017   NA 136 07/23/2017   K 3.7 07/23/2017   CL 106 07/23/2017   CO2 22 07/23/2017    Lab Results  Component Value Date   ALT 22 05/19/2017   AST 16 05/19/2017   ALKPHOS 91 02/06/2017   BILITOT 0.5 05/19/2017    Lab Results  Component Value Date   CHOL 262 (H) 04/06/2017   HDL 50 04/06/2017   LDLCALC 183 (H) 04/06/2017   TRIG 145 04/06/2017   CHOLHDL  5.2 (H) 04/06/2017   HIV 1 RNA Quant  Date Value  10/04/2017 <20 DETECTED copies/mL (A)  05/19/2017 497 copies/mL (H)  04/21/2017 348,000 Copies/mL (H)   CD4 T Cell Abs (/uL)  Date Value  10/04/2017 180 (L)  05/19/2017 100 (L)  04/06/2017 70 (L)   Lab Results  Component Value Date   HAV NON-REACTIVE 04/06/2017   Lab Results  Component Value Date   HEPBSAG NON-REACTIVE 04/06/2017   HEPBSAB NON-REACTIVE 04/06/2017   ASSESSMENT & PLAN: Problem List Items Addressed This Visit      Cardiovascular and Mediastinum   Leg DVT (deep venous thromboembolism), chronic, right ([facility])    S/P thrombectomy in 07-2017 ([facility]). Adherent with Xarelto. Had repeated venous duplex a few weeks ago and revealed mid to distal EIV demonstrates chronic appearing, partially obstructive thrombus. Plans for IVC placement in 3 weeks per the record. I encouraged him that there is no active signs of phlebitis or inflammation I can see on exam today. It sounds like he may have some claudication playing a role? I encouraged him to continue to work with his vascular surgery team and to continue his Xarelto. Advised Tylenol is OK with  anticoagulation and HAART for pain control.         Musculoskeletal and Integument   Eosinophilic folliculitis    No complaints - seems to be resolved with consistent HAART.         Other   HIV (human immunodeficiency virus infection) ([place])    Discussed recent viral load being undetectable and current CD4 of 180. Congratulated him and discussed what this means. He was very pleased. I would like for him to continue on his Biktarvy once a day as he would be someone to keep off cobicistat with co morbidities if possible. I recommended to continue Bactrim QD until our next visit in 3 months when we check labs again - likely will be able to stop at that time.       Healthcare maintenance    Recommended Prevnar vaccine today - his CD4 is up to 180. Will provide with Pneumovax at next appointment to complete pneumonia vaccination recommendations.       AIDS (acquired immune deficiency syndrome) ([place]) - Primary   Relevant Orders   HIV 1 RNA quant-no reflex-bld   T-helper cell (CD4)- ([facility] clinic only)     Janene Madeira, MSN, NP-C [facility] Pager: [phone]

## 2017-10-24 NOTE — Assessment & Plan Note (Signed)
S/P thrombectomy in 07-2017 ([facility]). Adherent with Xarelto. Had repeated venous duplex a few weeks ago and revealed mid to distal EIV demonstrates chronic appearing, partially obstructive thrombus. Plans for IVC placement in 3 weeks per the record. I encouraged him that there is no active signs of phlebitis or inflammation I can see on exam today. It sounds like he may have some claudication playing a role? I encouraged him to continue to work with his vascular surgery team and to continue his Xarelto. Advised Tylenol is OK with anticoagulation and HAART for pain control.

## 2017-10-24 NOTE — Assessment & Plan Note (Signed)
Recommended Prevnar vaccine today - his CD4 is up to 180. Will provide with Pneumovax at next appointment to complete pneumonia vaccination recommendations.

## 2017-10-24 NOTE — Addendum Note (Signed)
Addended by: Reggy Eye on: 10/24/2017 02:01 PM   Modules accepted: Orders

## 2017-11-02 ENCOUNTER — Encounter: Payer: Self-pay | Admitting: Infectious Diseases

## 2017-11-09 ENCOUNTER — Encounter (INDEPENDENT_AMBULATORY_CARE_PROVIDER_SITE_OTHER): Payer: Self-pay | Admitting: Physician Assistant

## 2017-11-09 ENCOUNTER — Other Ambulatory Visit: Payer: Self-pay

## 2017-11-09 ENCOUNTER — Ambulatory Visit (INDEPENDENT_AMBULATORY_CARE_PROVIDER_SITE_OTHER): Payer: Medicare Other | Admitting: Physician Assistant

## 2017-11-09 VITALS — BP 130/81 | HR 98 | Temp 97.6°F | Ht 67.0 in | Wt 159.6 lb

## 2017-11-09 DIAGNOSIS — I824Z3 Acute embolism and thrombosis of unspecified deep veins of distal lower extremity, bilateral: Secondary | ICD-10-CM

## 2017-11-09 DIAGNOSIS — R7303 Prediabetes: Secondary | ICD-10-CM

## 2017-11-09 DIAGNOSIS — M79604 Pain in right leg: Secondary | ICD-10-CM

## 2017-11-09 DIAGNOSIS — Z131 Encounter for screening for diabetes mellitus: Secondary | ICD-10-CM

## 2017-11-09 DIAGNOSIS — M79605 Pain in left leg: Secondary | ICD-10-CM

## 2017-11-09 DIAGNOSIS — E7800 Pure hypercholesterolemia, unspecified: Secondary | ICD-10-CM | POA: Diagnosis not present

## 2017-11-09 LAB — POCT GLYCOSYLATED HEMOGLOBIN (HGB A1C): Hemoglobin A1C: 5.7 % — AB (ref 4.0–5.6)

## 2017-11-09 NOTE — Progress Notes (Signed)
Subjective:  Patient ID: Juan Johnston, male    DOB: 1950-07-12  Age: 67 y.o. MRN: [id number]  CC: screening for diabetes  HPI Juan Johnston is a 67 y.o. male with a medical history of AIDS, DVT, HIV, thyroid goiter, scrotal abscess, insomnia, prediabetes, and oral candidiasis presents for screening for DM. Feels well overall. No polydipsia, polyuria, polyphagia, visual blurring, or tingling/numbness of extremities. Complains of bilateral hamstring pain since development of bilateral LE DVT. Taking Xarelto daily as directed. Takes Tylenol for leg pain with little relief. Does not endorse CP, palpitations, SOB, HA, abdominal pain, f/c/n/v, swelling, or GI/GU sxs.       Outpatient Medications Prior to Visit  Medication Sig Dispense Refill  . acetaminophen (TYLENOL) 325 MG tablet Take 2 tablets (650 mg total) by mouth every 6 (six) hours as needed for mild pain (or Fever >/= 101). 30 tablet 0  . aspirin 81 MG chewable tablet Chew 1 tablet (81 mg total) by mouth daily. 30 tablet 0  . bictegravir-emtricitabine-tenofovir AF (BIKTARVY) 50-200-25 MG TABS tablet Take 1 tablet by mouth daily. 30 tablet 2  . Rivaroxaban 15 & 20 MG TBPK Take 20 mg by mouth daily. Take 20 mg once a day. 30 each 6  . sulfamethoxazole-trimethoprim (BACTRIM DS,SEPTRA DS) 800-160 MG tablet Take 1 tablet by mouth daily.    . diphenhydrAMINE (BENADRYL) 25 MG tablet Take 25 mg by mouth every 6 (six) hours as needed for itching or allergies.      No facility-administered medications prior to visit.      ROS Review of Systems  Constitutional: Negative for chills, fever and malaise/fatigue.  Eyes: Negative for blurred vision.  Respiratory: Negative for shortness of breath.   Cardiovascular: Negative for chest pain and palpitations.  Gastrointestinal: Negative for abdominal pain and nausea.  Genitourinary: Negative for dysuria and hematuria.  Musculoskeletal: Positive for myalgias. Negative for joint pain.  Skin:  Negative for rash.  Neurological: Negative for tingling and headaches.  Psychiatric/Behavioral: Negative for depression. The patient is not nervous/anxious.     Objective:  BP 130/81 (BP Location: Left Arm, Patient Position: Sitting, Cuff Size: Normal)   Pulse 98   Temp 97.6 F (36.4 C) (Oral)   Ht 5\' 7"  (1.702 m)   Wt 159 lb 9.6 oz (72.4 kg)   SpO2 97%   BMI 25.00 kg/m   BP/Weight 11/09/2017 07/19/6061 0/04/6008  Systolic BP 932 355 732  Diastolic BP 81 78 79  Wt. (Lbs) 159.6 167 168.1  BMI 25 26.16 26.33      Physical Exam  Constitutional: He is oriented to person, place, and time.  Well developed, well nourished, NAD, polite  HENT:  Head: Normocephalic and atraumatic.  Eyes: No scleral icterus.  Neck: Normal range of motion. Neck supple. No thyromegaly present.  Cardiovascular: Normal rate, regular rhythm, normal heart sounds and intact distal pulses.  No LE edema bilaterally.   Pulmonary/Chest: Effort normal and breath sounds normal.  Musculoskeletal: He exhibits no edema.  Neurological: He is alert and oriented to person, place, and time.  Skin: Skin is warm and dry. No rash noted. No erythema. No pallor.  Psychiatric: He has a normal mood and affect. His behavior is normal. Thought content normal.  Vitals reviewed.    Assessment & Plan:    1. Prediabetes - Lipid panel; Future - Comprehensive metabolic panel; Future  2. Screening for diabetes mellitus - HgB A1c 5.7% today  3. Pure hypercholesterolemia - Lipid panel; Future - Comprehensive metabolic  panel; Future  4. DVT, lower extremity, distal, acute, bilateral ([place]) - Has refills of Xarelto up to December 2019.  5. Leg pain, bilateral - Ambulatory referral to Pain Clinic for bilateral leg pain 2/2 bilateral DVT.   Follow-up:  Return in about 6 months (around 05/12/2018) for Prediabetes and Xarelto refill   [name] PA

## 2017-11-09 NOTE — Patient Instructions (Signed)
Prediabetes Eating Plan Prediabetes-also called impaired glucose tolerance or impaired fasting glucose-is a condition that causes blood sugar (blood glucose) levels to be higher than normal. Following a healthy diet can help to keep prediabetes under control. It can also help to lower the risk of type 2 diabetes and heart disease, which are increased in people who have prediabetes. Along with regular exercise, a healthy diet:  Promotes weight loss.  Helps to control blood sugar levels.  Helps to improve the way that the body uses insulin.  What do I need to know about this eating plan?  Use the glycemic index (GI) to plan your meals. The index tells you how quickly a food will raise your blood sugar. Choose low-GI foods. These foods take a longer time to raise blood sugar.  Pay close attention to the amount of carbohydrates in the food that you eat. Carbohydrates increase blood sugar levels.  Keep track of how many calories you take in. Eating the right amount of calories will help you to achieve a healthy weight. Losing about 7 percent of your starting weight can help to prevent type 2 diabetes.  You may want to follow a Mediterranean diet. This diet includes a lot of vegetables, lean meats or fish, whole grains, fruits, and healthy oils and fats. What foods can I eat? Grains Whole grains, such as whole-wheat or whole-grain breads, crackers, cereals, and pasta. Unsweetened oatmeal. Bulgur. Barley. Quinoa. Brown rice. Corn or whole-wheat flour tortillas or taco shells. Vegetables Lettuce. Spinach. Peas. Beets. Cauliflower. Cabbage. Broccoli. Carrots. Tomatoes. Squash. Eggplant. Herbs. Peppers. Onions. Cucumbers. Brussels sprouts. Fruits Berries. Bananas. Apples. Oranges. Grapes. Papaya. Mango. Pomegranate. Kiwi. Grapefruit. Cherries. Meats and Other Protein Sources Seafood. Lean meats, such as chicken and turkey or lean cuts of pork and beef. Tofu. Eggs. Nuts. Beans. Dairy Low-fat or  fat-free dairy products, such as yogurt, cottage cheese, and cheese. Beverages Water. Tea. Coffee. Sugar-free or diet soda. Seltzer water. Milk. Milk alternatives, such as soy or almond milk. Condiments Mustard. Relish. Low-fat, low-sugar ketchup. Low-fat, low-sugar barbecue sauce. Low-fat or fat-free mayonnaise. Sweets and Desserts Sugar-free or low-fat pudding. Sugar-free or low-fat ice cream and other frozen treats. Fats and Oils Avocado. Walnuts. Olive oil. The items listed above may not be a complete list of recommended foods or beverages. Contact your dietitian for more options. What foods are not recommended? Grains Refined white flour and flour products, such as bread, pasta, snack foods, and cereals. Beverages Sweetened drinks, such as sweet iced tea and soda. Sweets and Desserts Baked goods, such as cake, cupcakes, pastries, cookies, and cheesecake. The items listed above may not be a complete list of foods and beverages to avoid. Contact your dietitian for more information. This information is not intended to replace advice given to you by your health care provider. Make sure you discuss any questions you have with your health care provider. Document Released: 08/06/2014 Document Revised: 08/28/2015 Document Reviewed: 04/17/2014 Elsevier Interactive Patient Education  2017 Elsevier Inc.  

## 2017-11-10 ENCOUNTER — Other Ambulatory Visit (INDEPENDENT_AMBULATORY_CARE_PROVIDER_SITE_OTHER): Payer: Medicare Other

## 2017-11-10 DIAGNOSIS — E7800 Pure hypercholesterolemia, unspecified: Secondary | ICD-10-CM

## 2017-11-10 DIAGNOSIS — R7303 Prediabetes: Secondary | ICD-10-CM

## 2017-11-10 NOTE — Progress Notes (Signed)
Labs drawn by onsite [facility] phlebotomist. Nat Christen, CMA

## 2017-11-11 ENCOUNTER — Other Ambulatory Visit (INDEPENDENT_AMBULATORY_CARE_PROVIDER_SITE_OTHER): Payer: Self-pay | Admitting: Physician Assistant

## 2017-11-11 ENCOUNTER — Telehealth (INDEPENDENT_AMBULATORY_CARE_PROVIDER_SITE_OTHER): Payer: Self-pay

## 2017-11-11 DIAGNOSIS — E7841 Elevated Lipoprotein(a): Secondary | ICD-10-CM

## 2017-11-11 LAB — COMPREHENSIVE METABOLIC PANEL
A/G RATIO: 1.6 (ref 1.2–2.2)
ALBUMIN: 4.2 g/dL (ref 3.6–4.8)
ALT: 13 IU/L (ref 0–44)
AST: 15 IU/L (ref 0–40)
Alkaline Phosphatase: 62 IU/L (ref 39–117)
BUN/Creatinine Ratio: 16 (ref 10–24)
BUN: 14 mg/dL (ref 8–27)
Bilirubin Total: 0.2 mg/dL (ref 0.0–1.2)
CALCIUM: 9.4 mg/dL (ref 8.6–10.2)
CO2: 21 mmol/L (ref 20–29)
Chloride: 102 mmol/L (ref 96–106)
Creatinine, Ser: 0.87 mg/dL (ref 0.76–1.27)
GFR, EST AFRICAN AMERICAN: 103 mL/min/{1.73_m2} (ref >59)
GFR, EST NON AFRICAN AMERICAN: 89 mL/min/{1.73_m2} (ref >59)
GLOBULIN, TOTAL: 2.6 g/dL (ref 1.5–4.5)
Glucose: 118 mg/dL — ABNORMAL HIGH (ref 65–99)
POTASSIUM: 3.9 mmol/L (ref 3.5–5.2)
Sodium: 138 mmol/L (ref 134–144)
TOTAL PROTEIN: 6.8 g/dL (ref 6.0–8.5)

## 2017-11-11 LAB — LIPID PANEL
Chol/HDL Ratio: 6.4 ratio — ABNORMAL HIGH (ref 0.0–5.0)
Cholesterol, Total: 261 mg/dL — ABNORMAL HIGH (ref 100–199)
HDL: 41 mg/dL (ref >39)
LDL Calculated: 158 mg/dL — ABNORMAL HIGH (ref 0–99)
TRIGLYCERIDES: 309 mg/dL — AB (ref 0–149)
VLDL Cholesterol Cal: 62 mg/dL — ABNORMAL HIGH (ref 5–40)

## 2017-11-11 MED ORDER — ATORVASTATIN CALCIUM 40 MG PO TABS: 40.0000 mg | ORAL_TABLET | Freq: Every day | ORAL | 3 refills | Status: DC

## 2017-11-11 NOTE — Telephone Encounter (Signed)
Patient is aware of elevated cholesterol and atorvastatin being sent to his pharmacy. Nat Christen, CMA

## 2017-11-11 NOTE — Telephone Encounter (Signed)
-----   Message from [name], PA-C sent at 11/11/2017  8:44 AM EDT ----- Cholesterol elevated. I have sent atorvastatin to his pharmacy.

## 2017-11-17 ENCOUNTER — Other Ambulatory Visit: Payer: Self-pay | Admitting: Infectious Diseases

## 2017-11-17 DIAGNOSIS — B20 Human immunodeficiency virus [HIV] disease: Secondary | ICD-10-CM

## 2017-11-18 ENCOUNTER — Encounter (HOSPITAL_COMMUNITY): Payer: Self-pay

## 2017-11-18 ENCOUNTER — Ambulatory Visit: Payer: Self-pay | Admitting: Family

## 2017-11-18 ENCOUNTER — Other Ambulatory Visit: Payer: Self-pay | Admitting: *Deleted

## 2017-11-18 DIAGNOSIS — B20 Human immunodeficiency virus [HIV] disease: Secondary | ICD-10-CM

## 2017-11-18 MED ORDER — SULFAMETHOXAZOLE-TRIMETHOPRIM 800-160 MG PO TABS: 1.0000 | ORAL_TABLET | Freq: Every day | ORAL | 5 refills | Status: DC

## 2017-11-18 MED ORDER — BICTEGRAVIR-EMTRICITAB-TENOFOV 50-200-25 MG PO TABS: 1.0000 | ORAL_TABLET | Freq: Every day | ORAL | 5 refills | Status: DC

## 2018-01-03 ENCOUNTER — Other Ambulatory Visit: Payer: Medicare Other

## 2018-01-03 DIAGNOSIS — B20 Human immunodeficiency virus [HIV] disease: Secondary | ICD-10-CM

## 2018-01-04 LAB — T-HELPER CELL (CD4) - (RCID CLINIC ONLY)
CD4 % Helper T Cell: 18 % — ABNORMAL LOW (ref 33–55)
CD4 T Cell Abs: 190 /uL — ABNORMAL LOW (ref 400–2700)

## 2018-01-05 LAB — HIV-1 RNA QUANT-NO REFLEX-BLD
HIV 1 RNA Quant: <20 copies/mL — AB
HIV-1 RNA Quant, Log: <1.3 Log copies/mL — AB

## 2018-01-06 ENCOUNTER — Ambulatory Visit (HOSPITAL_COMMUNITY)
Admission: RE | Admit: 2018-01-06 | Discharge: 2018-01-06 | Disposition: A | Discharge status: Home / Self Care | Payer: Medicare Other | Source: Ambulatory Visit | Attending: Vascular Surgery | Admitting: Vascular Surgery

## 2018-01-06 ENCOUNTER — Ambulatory Visit (INDEPENDENT_AMBULATORY_CARE_PROVIDER_SITE_OTHER): Payer: Medicare Other | Admitting: Vascular Surgery

## 2018-01-06 ENCOUNTER — Encounter: Payer: Self-pay | Admitting: Vascular Surgery

## 2018-01-06 ENCOUNTER — Ambulatory Visit (INDEPENDENT_AMBULATORY_CARE_PROVIDER_SITE_OTHER)
Admission: RE | Admit: 2018-01-06 | Discharge: 2018-01-06 | Disposition: A | Discharge status: Home / Self Care | Payer: Medicare Other | Source: Ambulatory Visit | Attending: Vascular Surgery | Admitting: Vascular Surgery

## 2018-01-06 ENCOUNTER — Other Ambulatory Visit: Payer: Self-pay

## 2018-01-06 VITALS — BP 123/84 | HR 58 | Temp 95.6°F | Resp 18 | Ht 67.0 in | Wt 166.0 lb

## 2018-01-06 DIAGNOSIS — I82523 Chronic embolism and thrombosis of iliac vein, bilateral: Secondary | ICD-10-CM

## 2018-01-06 DIAGNOSIS — I824Z3 Acute embolism and thrombosis of unspecified deep veins of distal lower extremity, bilateral: Secondary | ICD-10-CM

## 2018-01-06 DIAGNOSIS — M79604 Pain in right leg: Secondary | ICD-10-CM | POA: Diagnosis present

## 2018-01-06 DIAGNOSIS — M79605 Pain in left leg: Secondary | ICD-10-CM

## 2018-01-06 DIAGNOSIS — M7989 Other specified soft tissue disorders: Secondary | ICD-10-CM | POA: Insufficient documentation

## 2018-01-06 DIAGNOSIS — I82563 Chronic embolism and thrombosis of calf muscular vein, bilateral: Secondary | ICD-10-CM | POA: Diagnosis not present

## 2018-01-06 NOTE — Progress Notes (Signed)
Patient ID: Juan Johnston, male   DOB: 09-03-1950, 67 y.o.   MRN: [id number]  Reason for Consult: Follow-up   Referred by [name], PA-C  Subjective:     HPI:  Juan Johnston is a 67 y.o. male follows up from previous mechanical thrombectomy of his IVC down to his right common and external iliac veins, femoral veins and right popliteal veins.  He has known bilateral lower extremity chronic thrombus.  We did not place a stent at the time of surgery.  He has remained on Xarelto but is almost out at this time.  He also takes aspirin.  He is a chronic lifelong smoker.  No history of DVT that he knows about prior although he does have evidence of chronic disease.  Past Medical History:  Diagnosis Date  . HIV infection ([place])   . Thyroid goiter    Family History  Problem Relation Age of Onset  . Hypertension Mother    Past Surgical History:  Procedure Laterality Date  . INTRAVASCULAR ULTRASOUND/IVUS N/A 07/21/2017   Procedure: Intravascular Ultrasound/IVUS;  Surgeon: Waynetta Sandy, MD;  Location: [place] [facility];  Service: Cardiovascular;  Laterality: N/A;  . PERIPHERAL VASCULAR THROMBECTOMY N/A 07/20/2017   Procedure: PERIPHERAL VASCULAR THROMBECTOMY;  Surgeon: Serafina Mitchell, MD;  Location: [place] [facility];  Service: Cardiovascular;  Laterality: N/A;  . PERIPHERAL VASCULAR THROMBECTOMY N/A 07/21/2017   Procedure: PERIPHERAL VASCULAR THROMBECTOMY - LYSIS RECHECK;  Surgeon: Waynetta Sandy, MD;  Location: [place] [facility];  Service: Cardiovascular;  Laterality: N/A;  . scrotum cyst      Short Social History:  Social History   Tobacco Use  . Smoking status: Current Some Day Smoker    Types: Cigarettes    Last attempt to quit: 02/03/2017    Years since quitting: 0.9  . Smokeless tobacco: Never Used  . Tobacco comment: 1 pk lasts 2 days. "Does not inhale"  Substance Use Topics  . Alcohol use: Yes    No Known Allergies  Current  Outpatient Medications  Medication Sig Dispense Refill  . acetaminophen (TYLENOL) 325 MG tablet Take 2 tablets (650 mg total) by mouth every 6 (six) hours as needed for mild pain (or Fever >/= 101). 30 tablet 0  . aspirin 81 MG chewable tablet Chew 1 tablet (81 mg total) by mouth daily. 30 tablet 0  . atorvastatin (LIPITOR) 40 MG tablet Take 1 tablet (40 mg total) by mouth daily. 90 tablet 3  . BELBUCA 75 MCG FILM I 1 FILM INSIDE OF CHEEKS BID  0  . bictegravir-emtricitabine-tenofovir AF (BIKTARVY) 50-200-25 MG TABS tablet Take 1 tablet by mouth daily. 30 tablet 5  . Rivaroxaban 15 & 20 MG TBPK Take 20 mg by mouth daily. Take 20 mg once a day. 30 each 6  . sulfamethoxazole-trimethoprim (BACTRIM DS,SEPTRA DS) 800-160 MG tablet Take 1 tablet by mouth daily. 30 tablet 5  . gabapentin (NEURONTIN) 100 MG capsule TK 1 C PO QID  0   No current facility-administered medications for this visit.     Review of Systems  Constitutional:  Constitutional negative. Respiratory: Respiratory negative.  Cardiovascular: Negative for leg swelling.  GI: Gastrointestinal negative.  Musculoskeletal: Positive for leg pain.  Neurological: Neurological negative. Hematologic: Hematologic/lymphatic negative.  Psychiatric: Psychiatric negative.        Objective:  Objective   Vitals:   01/06/18 0958  BP: 123/84  Pulse: (!) 58  Resp: 18  Temp: (!) 95.6 F (35.3 C)  TempSrc: Oral  SpO2: 98%  Weight: 166 lb (75.3 kg)  Height: 5\' 7"  (1.702 m)   Body mass index is 26 kg/m.  Physical Exam  Constitutional: He is oriented to person, place, and time. He appears well-developed.  HENT:  Head: Normocephalic.  Eyes: Pupils are equal, round, and reactive to light.  Neck: Normal range of motion.  Cardiovascular:  Pulses:      Radial pulses are 2+ on the right side, and 2+ on the left side.       Popliteal pulses are 2+ on the right side, and 2+ on the left side.       Dorsalis pedis pulses are 2+ on the  right side, and 2+ on the left side.  Pulmonary/Chest: Effort normal.  Abdominal: Soft.  Musculoskeletal: He exhibits no edema.  Neurological: He is alert and oriented to person, place, and time.  Skin: Skin is warm and dry.  Psychiatric: He has a normal mood and affect. His behavior is normal. Judgment and thought content normal.    Data: I apparently evaluated hi his IVC iliac duplex which demonstrated patency of the central veins he has chronic appearing thrombus in his bilateral femoral veins.     Assessment/Plan:     67 year old male follows up after mechanical thrombectomy of his right lower extremity extending up into his IVC.  He had unknown etiology of clot but has been on Xarelto for 6 months and this cannot be stopped and he will transition to baby aspirin only.  I suspect that his smoking status and underlying HIV contributed but I do not think he needs lifelong anticoagulate this time.  He will follow-up in another 6 months with repeat studies unless he has issues prior.     Waynetta Sandy MD [facility]

## 2018-01-17 ENCOUNTER — Ambulatory Visit (INDEPENDENT_AMBULATORY_CARE_PROVIDER_SITE_OTHER): Payer: Medicare Other | Admitting: Infectious Diseases

## 2018-01-17 ENCOUNTER — Encounter: Payer: Self-pay | Admitting: Infectious Diseases

## 2018-01-17 VITALS — BP 122/69 | HR 82 | Temp 98.6°F | Wt 171.0 lb

## 2018-01-17 DIAGNOSIS — B20 Human immunodeficiency virus [HIV] disease: Secondary | ICD-10-CM

## 2018-01-17 DIAGNOSIS — Z0000 Encounter for general adult medical examination without abnormal findings: Secondary | ICD-10-CM

## 2018-01-17 DIAGNOSIS — Z23 Encounter for immunization: Secondary | ICD-10-CM | POA: Diagnosis not present

## 2018-01-17 DIAGNOSIS — I82501 Chronic embolism and thrombosis of unspecified deep veins of right lower extremity: Secondary | ICD-10-CM

## 2018-01-17 NOTE — Progress Notes (Signed)
Name:Juan Johnston  DOB: [birth year]  MRN: [id number]  PCP: [name], PA-C    Patient Active Problem List   Diagnosis Date Noted  . Leg DVT (deep venous thromboembolism), chronic, right ([place]) 07/20/2017  . HIV (human immunodeficiency virus infection) ([place]) 07/19/2017  . GERD (gastroesophageal reflux disease) 07/19/2017  . Cough 07/15/2017  . Thyroid goiter 05/19/2017  . AIDS (acquired immune deficiency syndrome) ([place]) 04/21/2017  . Healthcare maintenance 04/21/2017  . Hyperlipidemia 04/21/2017    Subjective:  Brief Narrative:  Juan Johnston is a 67 y.o. AA male with HIV infection diagnosed 03-2017. His CD4 nadir was 70 and was with AIDS defining illness (esophageal candidiasis/oral thrush). HIV Risk: MSM / HIV+ partner. OI Hx: esophageal candidiasis.   Previous Regimen:   Prezcobix + Descovy 2018 (stopped d/t DDI with NOAC)  Biktarvy 2019 --> undetectable   Genotype:   04-2017: K103N, E138G (R-nevirapine, efavirenz; I-rilpivirine)  Chief Complaint  Patient presents with  . Follow-up    HPI:  Juan Johnston is here for routine follow up for HIV/AIDS disease.   He is doing well on his Biktarvy and reports no missed doses since our last visit. No side effects and he has access to his medications via Medicare. He is also doing well taking his Bactrim daily for prophylaxis. Reports no complaints today suggestive of associated opportunistic infection or advancing HIV disease such as fevers, night sweats, weight loss, anorexia, cough, SOB, nausea, vomiting, diarrhea, headache, sensory changes, lymphadenopathy or oral thrush.    He has been working with vascular surgery team and was recently advised to stop his xarelto and continue only on baby aspirin - he would like to discuss further for reassurance on what was discussed at that visit. He has no s/sx of bleeding. Reports his leg pain is improving but still present in RLL just above the ankle. He continues to smoke cigarettes. He did  quit for a period of time but resumed "because he just likes to smoke." He does smoke marijuana frequently and tells me he has alcohol several days a week - prefers liquor and estimates 1-2 shots that is diluted in water several days a week. Smokes about 1 pack over 2 days.   Review of Systems  Constitutional: Negative for chills, fever, malaise/fatigue and weight loss.  HENT: Negative for sore throat.   Respiratory: Negative for hemoptysis, sputum production, shortness of breath and wheezing. Cough: occasionally in the AM.   Cardiovascular: Negative for chest pain and leg swelling.  Gastrointestinal: Negative for abdominal pain, diarrhea and vomiting.  Genitourinary: Negative for dysuria and flank pain.  Musculoskeletal: Negative for joint pain, myalgias and neck pain.  Skin: Negative for itching and rash.  Neurological: Negative for dizziness, tingling and headaches.  Psychiatric/Behavioral: Negative for depression and substance abuse. The patient is not nervous/anxious and does not have insomnia.     Past Medical History:  Diagnosis Date  . HIV infection ([place])   . Thyroid goiter    Outpatient Medications Prior to Visit  Medication Sig Dispense Refill  . aspirin 81 MG chewable tablet Chew 1 tablet (81 mg total) by mouth daily. 30 tablet 0  . atorvastatin (LIPITOR) 40 MG tablet Take 1 tablet (40 mg total) by mouth daily. 90 tablet 3  . bictegravir-emtricitabine-tenofovir AF (BIKTARVY) 50-200-25 MG TABS tablet Take 1 tablet by mouth daily. 30 tablet 5  . sulfamethoxazole-trimethoprim (BACTRIM DS,SEPTRA DS) 800-160 MG tablet Take 1 tablet by mouth daily. 30 tablet 5  . acetaminophen (TYLENOL) 325 MG tablet  Take 2 tablets (650 mg total) by mouth every 6 (six) hours as needed for mild pain (or Fever >/= 101). (Patient not taking: Reported on 01/17/2018) 30 tablet 0  . BELBUCA 75 MCG FILM I 1 FILM INSIDE OF CHEEKS BID  0  . gabapentin (NEURONTIN) 100 MG capsule TK 1 C PO QID  0  .  Rivaroxaban 15 & 20 MG TBPK Take 20 mg by mouth daily. Take 20 mg once a day. (Patient not taking: Reported on 01/17/2018) 30 each 6   No facility-administered medications prior to visit.    No Known Allergies  Social History   Tobacco Use  . Smoking status: Current Some Day Smoker    Types: Cigarettes    Last attempt to quit: 02/03/2017    Years since quitting: 0.9  . Smokeless tobacco: Never Used  . Tobacco comment: 1 pk lasts 2 days. "Does not inhale"  Substance Use Topics  . Alcohol use: Yes  . Drug use: No    Social History   Substance and Sexual Activity  Sexual Activity Yes  . Partners: Male  . Birth control/protection: None    Objective:   Vitals:   01/17/18 1114  BP: 122/69  Pulse: 82  Temp: 98.6 F (37 C)  TempSrc: Oral  Weight: 171 lb (77.6 kg)   Body mass index is 26.78 kg/m.   Physical Exam  Constitutional: He is oriented to person, place, and time and well-developed, well-nourished, and in no distress. No distress.  HENT:  Mouth/Throat: Oropharynx is clear and moist. No oral lesions. Normal dentition. No dental caries.  Eyes: Pupils are equal, round, and reactive to light. No scleral icterus.  Neck: Thyromegaly: goiter present   Cardiovascular: Normal rate, regular rhythm and normal heart sounds.  Pulmonary/Chest: Effort normal and breath sounds normal.  Abdominal: Soft. He exhibits no distension. There is no tenderness.  Musculoskeletal: He exhibits no edema.  Negative homan's sign. No warmth. Mild tenderness over calf.   Lymphadenopathy:    He has no cervical adenopathy.  Neurological: He is alert and oriented to person, place, and time.  Skin: Skin is warm and dry. No rash noted.  Psychiatric: Mood and affect normal.   Lab Results Lab Results  Component Value Date   WBC 3.3 (L) 07/23/2017   HGB 11.8 (L) 07/23/2017   HCT 35.0 (L) 07/23/2017   MCV 93.1 07/23/2017   PLT 154 07/23/2017    Lab Results  Component Value Date   CREATININE  0.87 11/10/2017   BUN 14 11/10/2017   NA 138 11/10/2017   K 3.9 11/10/2017   CL 102 11/10/2017   CO2 21 11/10/2017    Lab Results  Component Value Date   ALT 13 11/10/2017   AST 15 11/10/2017   ALKPHOS 62 11/10/2017   BILITOT 0.2 11/10/2017    Lab Results  Component Value Date   CHOL 261 (H) 11/10/2017   HDL 41 11/10/2017   LDLCALC 158 (H) 11/10/2017   TRIG 309 (H) 11/10/2017   CHOLHDL 6.4 (H) 11/10/2017   HIV 1 RNA Quant (copies/mL)  Date Value  01/03/2018 <20 DETECTED (A)  10/04/2017 <20 DETECTED (A)  05/19/2017 497 (H)   CD4 T Cell Abs (/uL)  Date Value  01/03/2018 190 (L)  10/04/2017 180 (L)  05/19/2017 100 (L)   Lab Results  Component Value Date   HAV NON-REACTIVE 04/06/2017   Lab Results  Component Value Date   HEPBSAG NON-REACTIVE 04/06/2017   HEPBSAB NON-REACTIVE 04/06/2017  ASSESSMENT & PLAN: Problem List Items Addressed This Visit      Unprioritized   AIDS (acquired immune deficiency syndrome) ([place]) - Primary    He continues to be suppressed on his Biktarvy and likes the single tablet pill. We will continue this for him. Will recheck his CD4 today, continue Bactrim until next visit for now. Congratulated him on the good effort he has put in to his medication adherence.  He will return in 3 months for follow up. Will do labs prior to visit.         Relevant Orders   HIV-1 RNA quant-no reflex-bld   T-helper cell (CD4)- ([facility] clinic only)   COMPLETE METABOLIC PANEL WITH GFR   CBC with Differential/Platelet   Lipid panel   RPR   Urinalysis   Healthcare maintenance    He has received the flu shot. Give pneumovax today.       Leg DVT (deep venous thromboembolism), chronic, right [facility])    Revisited Dr. Claretha Cooper note and recs with Shanon Brow today - reinforced stopping xarelto and continuing with ASA. He will follow up with dr. Donzetta Matters again in 59m with repeated vascular studies.        Other Visit Diagnoses    Need for 23-polyvalent pneumococcal  polysaccharide vaccine       Relevant Orders   Pneumococcal polysaccharide vaccine 23-valent greater than or equal to 2yo subcutaneous/IM (Completed)     No orders of the defined types were placed in this encounter.  Return in about 3 months (around 04/19/2018).   Janene Madeira, MSN, NP-C [facility] [place] [facility] Pager: [phone]

## 2018-01-17 NOTE — Progress Notes (Signed)
Flu and Pneumovax-23 administered today. Patient tolerated well. S.Jefrey Raburn, LPN

## 2018-01-17 NOTE — Patient Instructions (Addendum)
Wonderful to see you today!   Stop your xarelto as your vascular surgeon requests and continue your baby aspirin every day.   Try to reduce your cigarette use to 5-8 cigarettes a day. You can do it!  Please continue your Biktarvy and your Bactrim once a day.   Will see you back in 3 months with labs prior to your visit again.

## 2018-01-19 NOTE — Assessment & Plan Note (Signed)
Revisited Dr. Claretha Cooper note and recs with Shanon Brow today - reinforced stopping xarelto and continuing with ASA. He will follow up with dr. Donzetta Matters again in 31m with repeated vascular studies.

## 2018-01-19 NOTE — Assessment & Plan Note (Signed)
He has received the flu shot. Give pneumovax today.

## 2018-01-19 NOTE — Assessment & Plan Note (Signed)
He continues to be suppressed on his Biktarvy and likes the single tablet pill. We will continue this for him. Will recheck his CD4 today, continue Bactrim until next visit for now. Congratulated him on the good effort he has put in to his medication adherence.  He will return in 3 months for follow up. Will do labs prior to visit.

## 2018-04-10 ENCOUNTER — Other Ambulatory Visit: Payer: Medicare Other

## 2018-04-10 DIAGNOSIS — B20 Human immunodeficiency virus [HIV] disease: Secondary | ICD-10-CM

## 2018-04-11 LAB — T-HELPER CELL (CD4) - (RCID CLINIC ONLY)
CD4 % Helper T Cell: 15 % — ABNORMAL LOW (ref 33–55)
CD4 T Cell Abs: 230 /uL — ABNORMAL LOW (ref 400–2700)

## 2018-04-12 LAB — COMPLETE METABOLIC PANEL WITH GFR
AG RATIO: 1.8 (calc) (ref 1.0–2.5)
ALBUMIN MSPROF: 4.6 g/dL (ref 3.6–5.1)
ALT: 24 U/L (ref 9–46)
AST: 25 U/L (ref 10–35)
Alkaline phosphatase (APISO): 68 U/L (ref 40–115)
BILIRUBIN TOTAL: 0.4 mg/dL (ref 0.2–1.2)
BUN / CREAT RATIO: 9 (calc) (ref 6–22)
BUN: 13 mg/dL (ref 7–25)
CHLORIDE: 105 mmol/L (ref 98–110)
CO2: 23 mmol/L (ref 20–32)
Calcium: 9.9 mg/dL (ref 8.6–10.3)
Creat: 1.43 mg/dL — ABNORMAL HIGH (ref 0.70–1.25)
GFR, Est African American: 58 mL/min/{1.73_m2} — ABNORMAL LOW (ref >=60)
GFR, Est Non African American: 50 mL/min/{1.73_m2} — ABNORMAL LOW (ref >=60)
Globulin: 2.6 g/dL (calc) (ref 1.9–3.7)
Glucose, Bld: 86 mg/dL (ref 65–99)
Potassium: 4.5 mmol/L (ref 3.5–5.3)
Sodium: 137 mmol/L (ref 135–146)
TOTAL PROTEIN: 7.2 g/dL (ref 6.1–8.1)

## 2018-04-12 LAB — CBC WITH DIFFERENTIAL/PLATELET
Absolute Monocytes: 307 cells/uL (ref 200–950)
BASOS ABS: 29 {cells}/uL (ref 0–200)
Basophils Relative: 0.6 %
EOS ABS: 288 {cells}/uL (ref 15–500)
EOS PCT: 6 %
HEMATOCRIT: 47 % (ref 38.5–50.0)
HEMOGLOBIN: 16.2 g/dL (ref 13.2–17.1)
Lymphs Abs: 1546 cells/uL (ref 850–3900)
MCH: 33.1 pg — AB (ref 27.0–33.0)
MCHC: 34.5 g/dL (ref 32.0–36.0)
MCV: 95.9 fL (ref 80.0–100.0)
MONOS PCT: 6.4 %
MPV: 11.2 fL (ref 7.5–12.5)
NEUTROS ABS: 2630 {cells}/uL (ref 1500–7800)
Neutrophils Relative %: 54.8 %
Platelets: 204 10*3/uL (ref 140–400)
RBC: 4.9 10*6/uL (ref 4.20–5.80)
RDW: 12.6 % (ref 11.0–15.0)
Total Lymphocyte: 32.2 %
WBC: 4.8 10*3/uL (ref 3.8–10.8)

## 2018-04-12 LAB — LIPID PANEL
CHOLESTEROL: 179 mg/dL (ref <200)
HDL: 52 mg/dL (ref >40)
LDL Cholesterol (Calc): 100 mg/dL (calc) — ABNORMAL HIGH
Non-HDL Cholesterol (Calc): 127 mg/dL (calc) (ref <130)
TRIGLYCERIDES: 174 mg/dL — AB (ref <150)
Total CHOL/HDL Ratio: 3.4 (calc) (ref <5.0)

## 2018-04-12 LAB — RPR: RPR: NONREACTIVE

## 2018-04-12 LAB — HIV-1 RNA QUANT-NO REFLEX-BLD
HIV 1 RNA QUANT: 53 {copies}/mL — AB
HIV-1 RNA QUANT, LOG: 1.72 {Log_copies}/mL — AB

## 2018-04-20 ENCOUNTER — Encounter: Payer: Self-pay | Admitting: Family

## 2018-04-20 ENCOUNTER — Ambulatory Visit: Payer: Self-pay

## 2018-04-20 ENCOUNTER — Ambulatory Visit (INDEPENDENT_AMBULATORY_CARE_PROVIDER_SITE_OTHER): Payer: Medicare Other | Admitting: Family

## 2018-04-20 ENCOUNTER — Encounter: Payer: Self-pay | Admitting: Infectious Diseases

## 2018-04-20 ENCOUNTER — Other Ambulatory Visit: Payer: Self-pay | Admitting: Infectious Diseases

## 2018-04-20 VITALS — BP 115/70 | HR 76 | Temp 97.5°F | Wt 176.8 lb

## 2018-04-20 DIAGNOSIS — Z23 Encounter for immunization: Secondary | ICD-10-CM | POA: Diagnosis not present

## 2018-04-20 DIAGNOSIS — Z0000 Encounter for general adult medical examination without abnormal findings: Secondary | ICD-10-CM

## 2018-04-20 DIAGNOSIS — B20 Human immunodeficiency virus [HIV] disease: Secondary | ICD-10-CM

## 2018-04-20 MED ORDER — BICTEGRAVIR-EMTRICITAB-TENOFOV 50-200-25 MG PO TABS: 1.0000 | ORAL_TABLET | Freq: Every day | ORAL | 5 refills | Status: DC

## 2018-04-20 NOTE — Patient Instructions (Signed)
Nice to meet you!  Please continue to take your BIKATARVY as prescribed.  You can STOP taking BACTRIM (Sulfamethoxazole-trimethoprim).   Plan for follow up office visit in 3 months or sooner if needed with lab work 1-2 weeks prior to appointment.

## 2018-04-20 NOTE — Progress Notes (Signed)
Subjective:    Patient ID: Juan Johnston, male    DOB: 03/18/1951, 68 y.o.   MRN: [id number]  Chief Complaint  Patient presents with  . HIV Positive/AIDS     HPI:  Juan Johnston is a 68 y.o. male who presents today for routine follow up of HIV disease.  Juan Johnston was last seen in the office on 01/17/18 for routine follow up with good adherence and tolerance of his ART regimen of Biktarvy supplemented with Bactrim for OI prophylaxis. Most recent blood work on 04/10/18 with continued viral suppression with viral load of 53 and CD4 count of 230. Kidney function slightly worse compared to previous, otherwise electrolytes and liver function within normal ranges. RPR negative. Health maintenance due includes influenza vaccination and dental screening.   Juan Johnston continues to take his Biktarvy and Bactrim as prescribed with no adverse side effects or missed doses. He has no problems obtaining his medications. Denies fevers, chills, night sweats, headaches, changes in vision, neck pain/stiffness, nausea, diarrhea, vomiting, lesions or rashes.   No Known Allergies    Outpatient Medications Prior to Visit  Medication Sig Dispense Refill  . aspirin 81 MG chewable tablet Chew 1 tablet (81 mg total) by mouth daily. 30 tablet 0  . atorvastatin (LIPITOR) 40 MG tablet Take 1 tablet (40 mg total) by mouth daily. 90 tablet 3  . bictegravir-emtricitabine-tenofovir AF (BIKTARVY) 50-200-25 MG TABS tablet Take 1 tablet by mouth daily. 30 tablet 5  . sulfamethoxazole-trimethoprim (BACTRIM DS,SEPTRA DS) 800-160 MG tablet Take 1 tablet by mouth daily. 30 tablet 5  . BELBUCA 75 MCG FILM I 1 FILM INSIDE OF CHEEKS BID  0  . gabapentin (NEURONTIN) 100 MG capsule TK 1 C PO QID  0  . acetaminophen (TYLENOL) 325 MG tablet Take 2 tablets (650 mg total) by mouth every 6 (six) hours as needed for mild pain (or Fever >/= 101). (Patient not taking: Reported on 01/17/2018) 30 tablet 0  . Rivaroxaban 15 &  20 MG TBPK Take 20 mg by mouth daily. Take 20 mg once a day. (Patient not taking: Reported on 01/17/2018) 30 each 6   No facility-administered medications prior to visit.      Past Medical History:  Diagnosis Date  . HIV infection ([place])   . Thyroid goiter      Past Surgical History:  Procedure Laterality Date  . INTRAVASCULAR ULTRASOUND/IVUS N/A 07/21/2017   Procedure: Intravascular Ultrasound/IVUS;  Surgeon: Waynetta Sandy, MD;  Location: [place] [facility];  Service: Cardiovascular;  Laterality: N/A;  . PERIPHERAL VASCULAR THROMBECTOMY N/A 07/20/2017   Procedure: PERIPHERAL VASCULAR THROMBECTOMY;  Surgeon: Serafina Mitchell, MD;  Location: [place] [facility];  Service: Cardiovascular;  Laterality: N/A;  . PERIPHERAL VASCULAR THROMBECTOMY N/A 07/21/2017   Procedure: PERIPHERAL VASCULAR THROMBECTOMY - LYSIS RECHECK;  Surgeon: Waynetta Sandy, MD;  Location: [place] [facility];  Service: Cardiovascular;  Laterality: N/A;  . scrotum cyst         Review of Systems  Constitutional: Negative for appetite change, chills, fatigue, fever and unexpected weight change.  Eyes: Negative for visual disturbance.  Respiratory: Negative for cough, chest tightness, shortness of breath and wheezing.   Cardiovascular: Negative for chest pain and leg swelling.  Gastrointestinal: Negative for abdominal pain, constipation, diarrhea, nausea and vomiting.  Genitourinary: Negative for dysuria, flank pain, frequency, genital sores, hematuria and urgency.  Skin: Negative for rash.  Allergic/Immunologic: Negative for immunocompromised state.  Neurological: Negative for dizziness and headaches.  Objective:    BP 115/70   Pulse 76   Temp (!) 97.5 F (36.4 C) (Oral)   Wt 176 lb 12.8 oz (80.2 kg)   BMI 27.69 kg/m  Nursing note and vital signs reviewed.  Physical Exam Constitutional:      General: He is not in acute distress.    Appearance: He is well-developed.  Eyes:      Conjunctiva/sclera: Conjunctivae normal.  Neck:     Musculoskeletal: Neck supple.  Cardiovascular:     Rate and Rhythm: Normal rate and regular rhythm.     Heart sounds: Normal heart sounds. No murmur. No friction rub. No gallop.   Pulmonary:     Effort: Pulmonary effort is normal. No respiratory distress.     Breath sounds: Normal breath sounds. No wheezing or rales.  Chest:     Chest wall: No tenderness.  Abdominal:     General: Bowel sounds are normal.     Palpations: Abdomen is soft.     Tenderness: There is no abdominal tenderness.  Lymphadenopathy:     Cervical: No cervical adenopathy.  Skin:    General: Skin is warm and dry.     Findings: No rash.  Neurological:     Mental Status: He is alert and oriented to person, place, and time.  Psychiatric:        Behavior: Behavior normal.        Thought Content: Thought content normal.        Judgment: Judgment normal.         Assessment & Plan:   Problem List Items Addressed This Visit      Other   AIDS (acquired immune deficiency syndrome) ([place]) - Primary    Juan Johnston has well controlled HIV disease with Biktarvy and remains virally suppressed. There is no further need for Bactrim at this time as he has viral suppression and his CD4 count is above 200. No signs/symptoms of opportunistic infection or progressive HIV disease. Continue current dose of Biktarvy. Plan for office follow up in 3 months or sooner if needed with lab work 1-2 prior to appointment.       Relevant Medications   bictegravir-emtricitabine-tenofovir AF (BIKTARVY) 50-200-25 MG TABS tablet   Other Relevant Orders   T-helper cell (CD4)- ([facility] clinic only)   HIV-1 RNA quant-no reflex-bld   CBC   Comprehensive metabolic panel   Lipid panel   RPR   Healthcare maintenance     Influenza updated today.  Discussed dental screening and he would like to speak with Colletta Maryland about that first.   Reminded of the importance of safe sexual practice to  reduce risk of acquisition or transmission of STI. Declined condoms today.   Colon cancer screening up to date.        Other Visit Diagnoses    Need for immunization against influenza       Relevant Orders   Flu Vaccine QUAD 36+ mos IM (Completed)       I have discontinued Juan Johnston's acetaminophen, Rivaroxaban, and sulfamethoxazole-trimethoprim. I am also having him maintain his aspirin, atorvastatin, BELBUCA, gabapentin, and bictegravir-emtricitabine-tenofovir AF.   Meds ordered this encounter  Medications  . bictegravir-emtricitabine-tenofovir AF (BIKTARVY) 50-200-25 MG TABS tablet    Sig: Take 1 tablet by mouth daily.    Dispense:  30 tablet    Refill:  5    Order Specific Question:   Supervising Provider    Answer:   Carlyle Basques [phone]  Follow-up: Return in about 3 months (around 07/20/2018), or if symptoms worsen or fail to improve.   Terri Piedra, MSN, FNP-C Nurse Practitioner [facility] [place] [facility] Office phone: [phone] Pager: [place] number: [phone]

## 2018-04-20 NOTE — Assessment & Plan Note (Signed)
Juan Johnston has well controlled HIV disease with Biktarvy and remains virally suppressed. There is no further need for Bactrim at this time as he has viral suppression and his CD4 count is above 200. No signs/symptoms of opportunistic infection or progressive HIV disease. Continue current dose of Biktarvy. Plan for office follow up in 3 months or sooner if needed with lab work 1-2 prior to appointment.

## 2018-04-20 NOTE — Assessment & Plan Note (Signed)
   Influenza updated today.  Discussed dental screening and he would like to speak with Colletta Maryland about that first.   Reminded of the importance of safe sexual practice to reduce risk of acquisition or transmission of STI. Declined condoms today.   Colon cancer screening up to date.

## 2018-05-10 ENCOUNTER — Other Ambulatory Visit: Payer: Self-pay

## 2018-05-10 ENCOUNTER — Encounter (INDEPENDENT_AMBULATORY_CARE_PROVIDER_SITE_OTHER): Payer: Self-pay | Admitting: Primary Care

## 2018-05-10 ENCOUNTER — Ambulatory Visit (INDEPENDENT_AMBULATORY_CARE_PROVIDER_SITE_OTHER): Payer: Medicare Other | Admitting: Primary Care

## 2018-05-10 VITALS — BP 140/75 | HR 71 | Temp 97.5°F | Resp 16 | Wt 176.0 lb

## 2018-05-10 DIAGNOSIS — Z21 Asymptomatic human immunodeficiency virus [HIV] infection status: Secondary | ICD-10-CM

## 2018-05-10 DIAGNOSIS — E1169 Type 2 diabetes mellitus with other specified complication: Secondary | ICD-10-CM

## 2018-05-10 DIAGNOSIS — E785 Hyperlipidemia, unspecified: Secondary | ICD-10-CM

## 2018-05-10 DIAGNOSIS — R7303 Prediabetes: Secondary | ICD-10-CM

## 2018-05-10 LAB — GLUCOSE, POCT (MANUAL RESULT ENTRY): POC Glucose: 133 mg/dl — AB (ref 70–99)

## 2018-05-10 LAB — POCT GLYCOSYLATED HEMOGLOBIN (HGB A1C): HbA1c, POC (prediabetic range): 6.2 % (ref 5.7–6.4)

## 2018-05-10 NOTE — Progress Notes (Signed)
Subjective:  Patient ID: Juan Johnston, male    DOB: 07-17-1950  Age: 68 y.o. MRN: [id number]  CC: No chief complaint on file.   HPI Juan Johnston presents for labs. POC AIC 6.2. Reviewed labs from previous appt on 04/20/2018  Outpatient Medications Prior to Visit  Medication Sig Dispense Refill  . aspirin 81 MG chewable tablet Chew 1 tablet (81 mg total) by mouth daily. 30 tablet 0  . atorvastatin (LIPITOR) 40 MG tablet Take 1 tablet (40 mg total) by mouth daily. 90 tablet 3  . bictegravir-emtricitabine-tenofovir AF (BIKTARVY) 50-200-25 MG TABS tablet Take 1 tablet by mouth daily. 30 tablet 5  . BELBUCA 75 MCG FILM I 1 FILM INSIDE OF CHEEKS BID  0  . BIKTARVY 50-200-25 MG TABS tablet TAKE 1 TABLET BY MOUTH DAILY 30 tablet 4  . gabapentin (NEURONTIN) 100 MG capsule TK 1 C PO QID  0   No facility-administered medications prior to visit.     ROS Review of Systems  Constitutional: Negative.   HENT: Negative.   Eyes: Negative.   Respiratory: Negative.   Cardiovascular: Negative.   Gastrointestinal: Negative.   Endocrine: Negative.   Genitourinary: Negative.   Musculoskeletal: Negative.   Skin: Negative.   Allergic/Immunologic: Negative.   Neurological: Negative.   Hematological: Negative.   Psychiatric/Behavioral: Negative.     Objective:  BP 140/75 (BP Location: Left Arm, Patient Position: Sitting, Cuff Size: Normal)   Pulse 71   Temp (!) 97.5 F (36.4 C) (Oral)   Resp 16   Wt 176 lb (79.8 kg)   SpO2 99%   BMI 27.57 kg/m   BP/Weight 05/10/2018 04/20/2018 92/02/9416  Systolic BP 408 144 818  Diastolic BP 75 70 69  Wt. (Lbs) 176 176.8 171  BMI 27.57 27.69 26.78    Hypertension  Juan Johnston IS NOT compliance with low sodium diet.  He is compliant with medications. He iscompliant with exercise. Exercise: walking   Lab Data BMP Latest Ref Rng & Units 04/10/2018 11/10/2017 07/23/2017  Glucose 65 - 99 mg/dL 86 118(H) 147(H)  BUN 7 - 25 mg/dL 13 14 5(L)   Creatinine 0.70 - 1.25 mg/dL 1.43(H) 0.87 0.81  BUN/Creat Ratio 6 - 22 (calc) 9 16 -  Sodium 135 - 146 mmol/L 137 138 136  Potassium 3.5 - 5.3 mmol/L 4.5 3.9 3.7  Chloride 98 - 110 mmol/L 105 102 106  CO2 20 - 32 mmol/L 23 21 22   Calcium 8.6 - 10.3 mg/dL 9.9 9.4 8.9     Physical Exam Vitals signs reviewed.  HENT:     Head: Normocephalic.     Nose: Nose normal.  Eyes:     Extraocular Movements: Extraocular movements intact.     Pupils: Pupils are equal, round, and reactive to light.  Neck:     Musculoskeletal: Normal range of motion.  Cardiovascular:     Rate and Rhythm: Normal rate and regular rhythm.  Pulmonary:     Effort: Pulmonary effort is normal.     Breath sounds: Normal breath sounds.  Abdominal:     General: Abdomen is flat.     Palpations: Abdomen is soft.  Musculoskeletal: Normal range of motion.  Skin:    General: Skin is warm.  Neurological:     Mental Status: He is alert.  Psychiatric:        Mood and Affect: Mood normal.      Assessment & Plan:  Diagnoses and all orders for this visit:  Hyperlipidemia associated with  type 2 diabetes mellitus (HCC) Elevated reviewed from ID visit 04/10/2018 on a statin continue  Asymptomatic HIV infection ([place])  Viral load un dected compliant with medications followed by ID Information obtain from [facility]   . Prediabetes discussed healthy eating habits reductions of carbs patient stated he would eat what ever he likes. - Glucose (CBG) - HgB A1c POCT 6.2   No orders of the defined types were placed in this encounter.   Follow-up: No follow-ups on file.   Kerin Perna NP

## 2018-05-11 ENCOUNTER — Encounter (INDEPENDENT_AMBULATORY_CARE_PROVIDER_SITE_OTHER): Payer: Self-pay | Admitting: Primary Care

## 2018-05-11 DIAGNOSIS — E1169 Type 2 diabetes mellitus with other specified complication: Secondary | ICD-10-CM | POA: Insufficient documentation

## 2018-05-11 DIAGNOSIS — E785 Hyperlipidemia, unspecified: Secondary | ICD-10-CM

## 2018-05-15 ENCOUNTER — Other Ambulatory Visit: Payer: Self-pay | Admitting: Infectious Diseases

## 2018-05-15 DIAGNOSIS — B20 Human immunodeficiency virus [HIV] disease: Secondary | ICD-10-CM

## 2018-05-31 ENCOUNTER — Encounter: Payer: Self-pay | Admitting: Infectious Diseases

## 2018-06-16 IMAGING — DX DG CHEST 2V
2 series · 2 of 2 positions shown · non-contrast
Comparison: PA and lateral chest 02/06/2017.

CLINICAL DATA: Midline chest pain with eating and shortness of
breath for 3 days.

EXAM:
CHEST  2 VIEW

[chest pa]
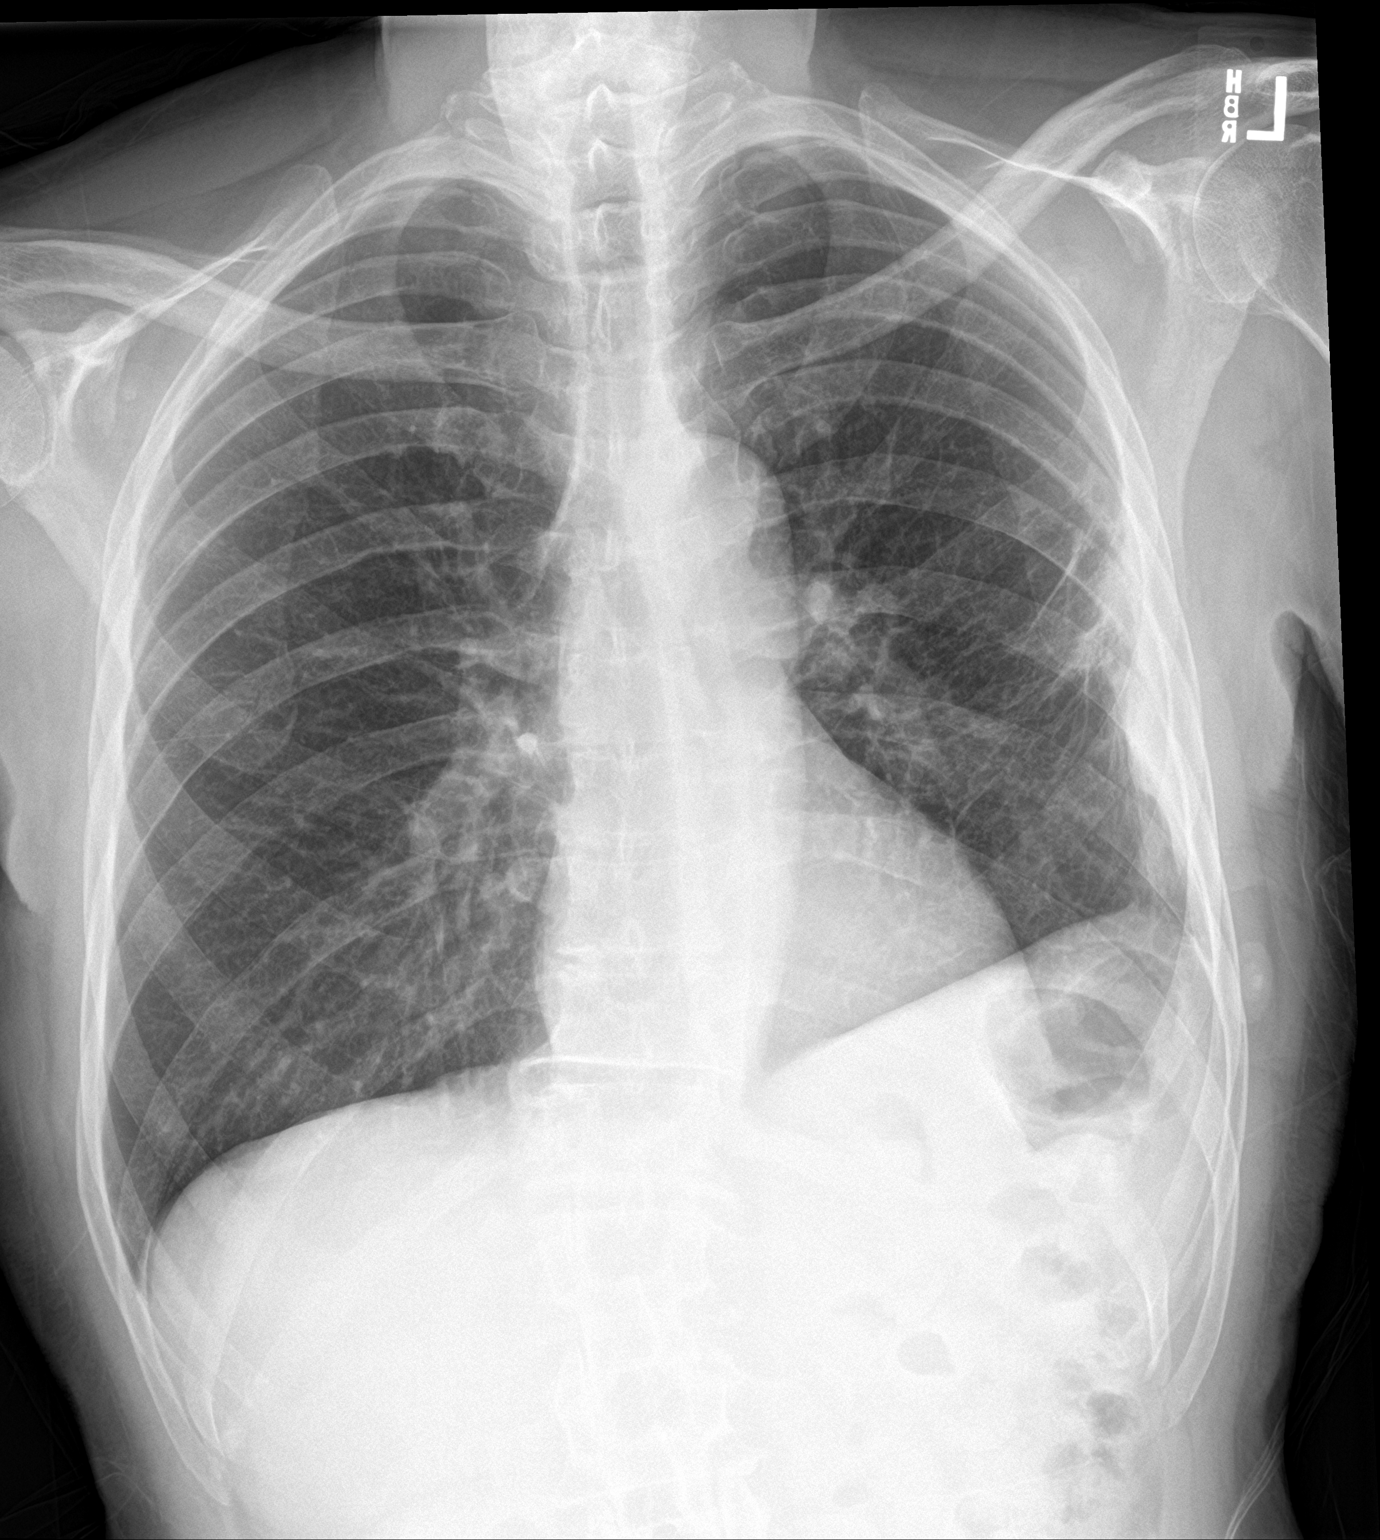

[chest lat]
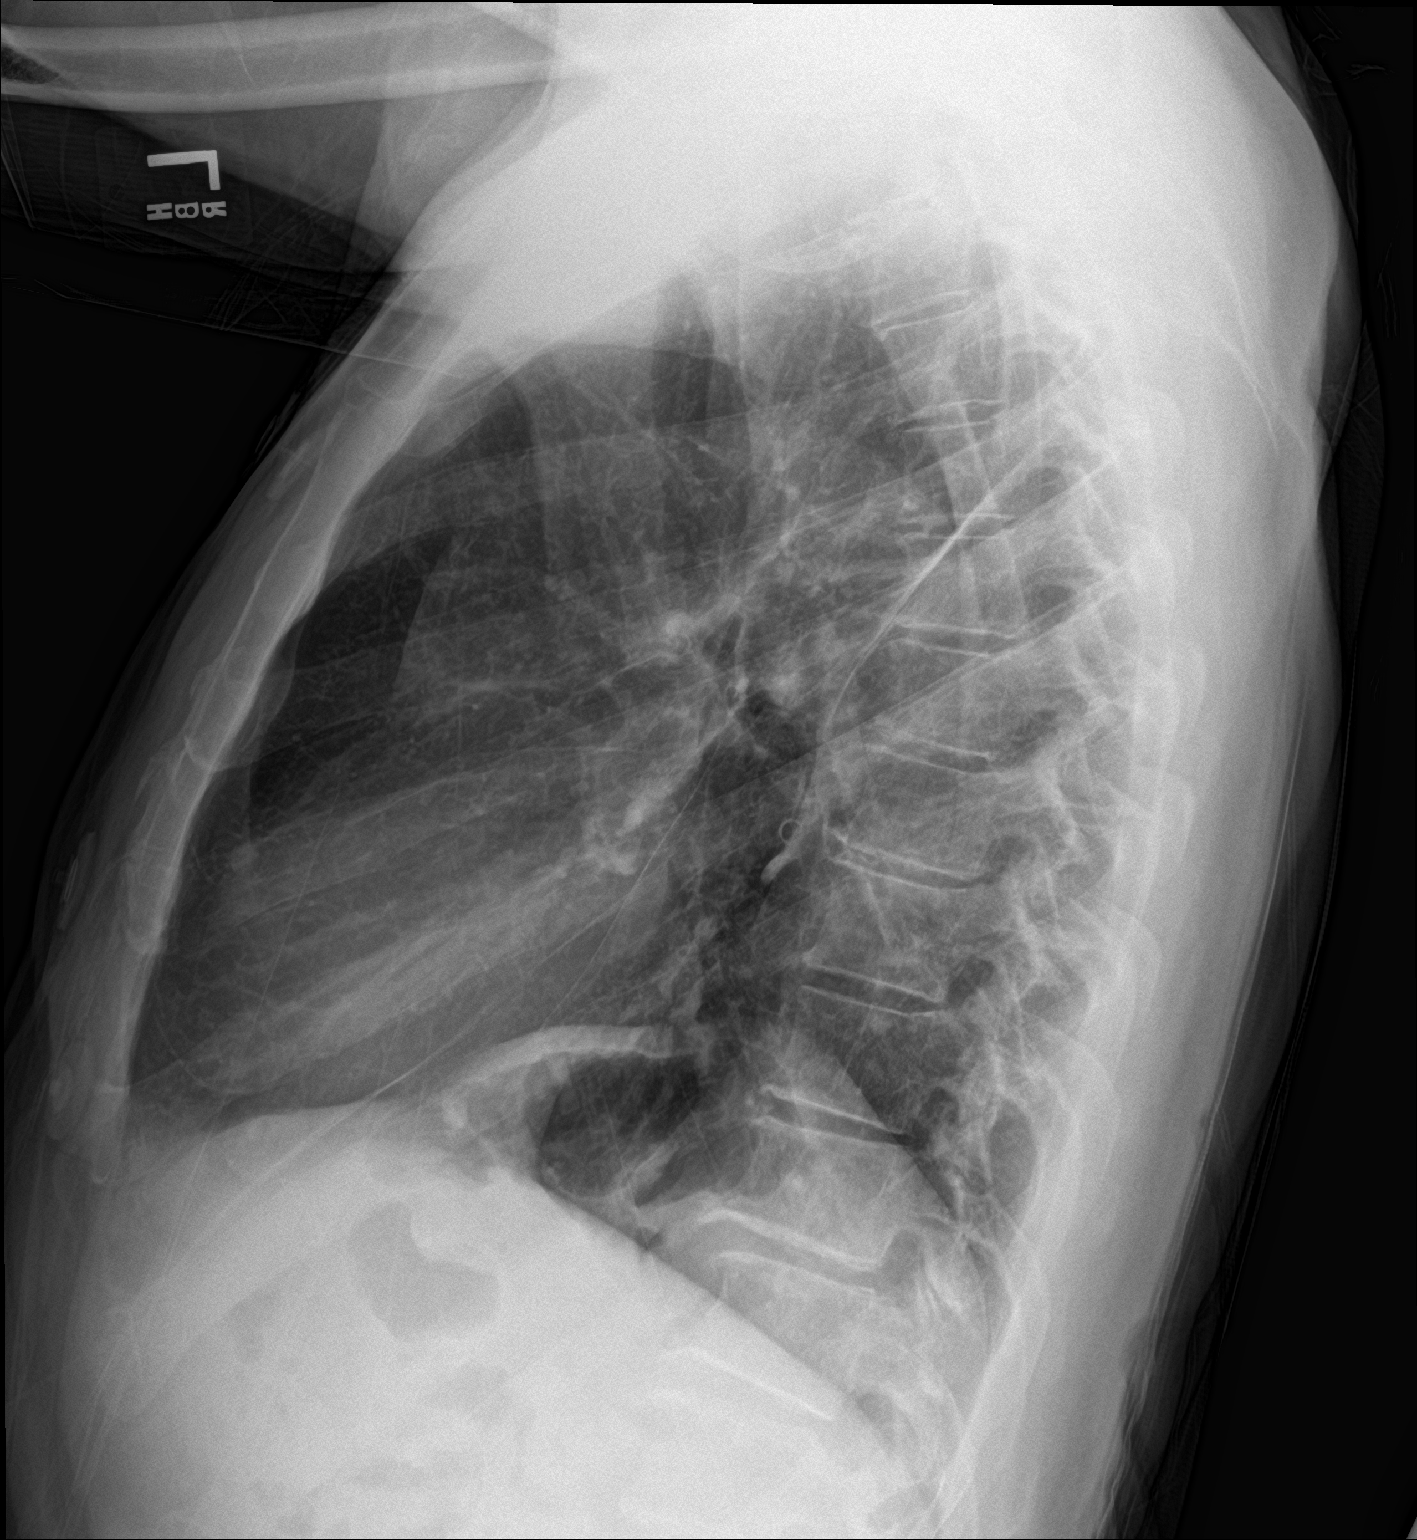

[2 of 2 positions shown; findings below may reference images not displayed]

FINDINGS: Left basilar airspace disease seen on the prior examination has
resolved. There is new volume loss in the left lower lobe and
pleural thickening or fluid along the lateral aspect of the left
chest wall. Right lung is expanded and clear. Heart size is normal.
No pneumothorax.
IMPRESSION: New pleural thickening or fluid along the lateral aspect of the left
chest wall and volume loss in the left lower lobe is likely the
sequelae of pneumonia seen on the prior examination. Repeat PA and
lateral chest films in 6- 8 weeks to ensure stability recommended.

## 2018-07-06 ENCOUNTER — Other Ambulatory Visit: Payer: Self-pay

## 2018-07-06 DIAGNOSIS — M79604 Pain in right leg: Secondary | ICD-10-CM

## 2018-07-06 DIAGNOSIS — M79605 Pain in left leg: Principal | ICD-10-CM

## 2018-07-07 ENCOUNTER — Other Ambulatory Visit: Payer: Self-pay

## 2018-07-07 ENCOUNTER — Ambulatory Visit (INDEPENDENT_AMBULATORY_CARE_PROVIDER_SITE_OTHER): Payer: Medicare Other | Admitting: Vascular Surgery

## 2018-07-07 ENCOUNTER — Ambulatory Visit (HOSPITAL_COMMUNITY)
Admission: RE | Admit: 2018-07-07 | Discharge: 2018-07-07 | Disposition: A | Discharge status: Home / Self Care | Payer: Medicare Other | Source: Ambulatory Visit | Attending: Vascular Surgery | Admitting: Vascular Surgery

## 2018-07-07 ENCOUNTER — Ambulatory Visit: Payer: Self-pay | Admitting: Vascular Surgery

## 2018-07-07 ENCOUNTER — Encounter: Payer: Self-pay | Admitting: Vascular Surgery

## 2018-07-07 ENCOUNTER — Encounter (HOSPITAL_COMMUNITY): Payer: Self-pay

## 2018-07-07 VITALS — BP 144/93 | HR 75 | Temp 96.8°F | Resp 20 | Ht 67.0 in | Wt 178.0 lb

## 2018-07-07 DIAGNOSIS — I824Z3 Acute embolism and thrombosis of unspecified deep veins of distal lower extremity, bilateral: Secondary | ICD-10-CM | POA: Diagnosis not present

## 2018-07-07 DIAGNOSIS — F17200 Nicotine dependence, unspecified, uncomplicated: Secondary | ICD-10-CM

## 2018-07-07 DIAGNOSIS — M79604 Pain in right leg: Secondary | ICD-10-CM | POA: Diagnosis not present

## 2018-07-07 DIAGNOSIS — M79605 Pain in left leg: Secondary | ICD-10-CM | POA: Diagnosis not present

## 2018-07-07 NOTE — Progress Notes (Signed)
   Subjective: 13-year male follows up after right lower extremity venous intervention that was performed 1 year ago.  At last visit we stopped Xarelto he is now on aspirin.  Did not have any other history of DVT no stents were placed at time of surgery.  Has pain in his bilateral lower extremities that is equal appears to be brought on by walking.  Does not have any swelling does not wear compression stockings.  Continues to smoke daily.  Past medical history: HIV, DVT, thyroid goiter Family history: Hypertension in his mother Past surgical history:  Mechanical thrombectomy DVT Social history: Continues to smoke daily Medications: Aspirin, atorvastatin  ROS:  Constitutional negative Respiratory negative Cardiovascular no leg swelling GI negative Musculoskeletal bilateral lower extremity leg pain with walking Neurologic negative Hematologic negative Psychiatric negative   Objective:  Vitals:   07/07/18 0904  BP: (!) 144/93  Pulse: 75  Resp: 20  Temp: (!) 96.8 F (36 C)  SpO2: 96%   Constitutional awake alert oriented to time person place Head eyes ears nose throat: Normocephalic pupils equal round reactive to light Cardiovascular palpable right posterior tibial pulse I cannot palpate a left popliteal pulse or tibial pulses both feet are well-perfused without ulceration Pulmonary nonlabored respirations Musculoskeletal no edema bilateral lower extremities Skin is warm and dry Psychiatric normal mood and affect  Non invasive vascular studies I have independently interpreted his IVC iliac duplex which demonstrates compressible veins that are patent there is some residual thrombus in the left common femoral vein.   A/P:  68 year old male follows up after mechanical thrombectomy for DVT.  Currently on aspirin only.  I recommended smoking cessation.  Appears to have some claudication but palpable pulses on the right likely has SFA occlusion.  No post thrombotic syndrome from  previous DVT.  Follow-up in 1 year with repeat studies.   Haik Mahoney C. Donzetta Matters, MD [facility] Office: [phone] Pager: [phone]

## 2018-07-19 ENCOUNTER — Telehealth: Payer: Self-pay | Admitting: Family

## 2018-07-19 NOTE — Telephone Encounter (Signed)
COVID-19 Pre-Screening Questions:07/19/18   Do you currently have a fever (>100 F), chills or unexplained body aches? NO Are you currently experiencing new cough, shortness of breath, sore throat, runny nose? NO    Have you recently travelled outside the state of [place] in the last 14 days? NO   Have you been in contact with someone that is currently pending confirmation of Covid19 testing or has been confirmed to have the [place] virus? NO  **If the patient answers NO to ALL questions -  advise the patient to please call the clinic before coming to the office should any symptoms develop.

## 2018-07-20 ENCOUNTER — Other Ambulatory Visit: Payer: Self-pay

## 2018-07-20 ENCOUNTER — Other Ambulatory Visit: Payer: Medicare Other

## 2018-07-20 DIAGNOSIS — B20 Human immunodeficiency virus [HIV] disease: Secondary | ICD-10-CM

## 2018-07-21 LAB — T-HELPER CELL (CD4) - (RCID CLINIC ONLY)
CD4 % Helper T Cell: 18 % — ABNORMAL LOW (ref 33–55)
CD4 T Cell Abs: 320 /uL — ABNORMAL LOW (ref 400–2700)

## 2018-07-24 LAB — HIV-1 RNA QUANT-NO REFLEX-BLD
HIV 1 RNA Quant: 24 copies/mL — ABNORMAL HIGH
HIV-1 RNA Quant, Log: 1.38 Log copies/mL — ABNORMAL HIGH

## 2018-07-24 LAB — LIPID PANEL
Cholesterol: 180 mg/dL (ref <200)
HDL: 51 mg/dL (ref >=40)
LDL Cholesterol (Calc): 111 mg/dL (calc) — ABNORMAL HIGH
Non-HDL Cholesterol (Calc): 129 mg/dL (calc) (ref <130)
Total CHOL/HDL Ratio: 3.5 (calc) (ref <5.0)
Triglycerides: 85 mg/dL (ref <150)

## 2018-07-24 LAB — COMPREHENSIVE METABOLIC PANEL
AG Ratio: 1.7 (calc) (ref 1.0–2.5)
ALT: 22 U/L (ref 9–46)
AST: 16 U/L (ref 10–35)
Albumin: 4.3 g/dL (ref 3.6–5.1)
Alkaline phosphatase (APISO): 66 U/L (ref 35–144)
BUN: 18 mg/dL (ref 7–25)
CO2: 27 mmol/L (ref 20–32)
Calcium: 10 mg/dL (ref 8.6–10.3)
Chloride: 105 mmol/L (ref 98–110)
Creat: 0.87 mg/dL (ref 0.70–1.25)
Globulin: 2.5 g/dL (calc) (ref 1.9–3.7)
Glucose, Bld: 133 mg/dL — ABNORMAL HIGH (ref 65–99)
Potassium: 4 mmol/L (ref 3.5–5.3)
Sodium: 139 mmol/L (ref 135–146)
Total Bilirubin: 0.6 mg/dL (ref 0.2–1.2)
Total Protein: 6.8 g/dL (ref 6.1–8.1)

## 2018-07-24 LAB — CBC
HCT: 43.9 % (ref 38.5–50.0)
Hemoglobin: 15.1 g/dL (ref 13.2–17.1)
MCH: 32.4 pg (ref 27.0–33.0)
MCHC: 34.4 g/dL (ref 32.0–36.0)
MCV: 94.2 fL (ref 80.0–100.0)
MPV: 11.3 fL (ref 7.5–12.5)
Platelets: 187 10*3/uL (ref 140–400)
RBC: 4.66 10*6/uL (ref 4.20–5.80)
RDW: 13.6 % (ref 11.0–15.0)
WBC: 4.7 10*3/uL (ref 3.8–10.8)

## 2018-07-24 LAB — RPR: RPR Ser Ql: NONREACTIVE

## 2018-07-28 ENCOUNTER — Telehealth: Payer: Self-pay

## 2018-07-28 NOTE — Telephone Encounter (Signed)
-----   Message from [place], [place] sent at 07/28/2018  8:35 AM EDT ----- Please inform Juan Johnston that his viral load is undetectable and CD4 count is 320. Continue to take Biktarvy and follow up in May as planned.

## 2018-07-28 NOTE — Telephone Encounter (Signed)
Patient made aware of results.  Eugenia Mcalpine, LPN

## 2018-08-08 ENCOUNTER — Encounter: Payer: Self-pay | Admitting: Primary Care

## 2018-08-08 ENCOUNTER — Other Ambulatory Visit: Payer: Self-pay

## 2018-08-08 ENCOUNTER — Ambulatory Visit: Payer: Medicare Other | Attending: Primary Care | Admitting: Primary Care

## 2018-08-08 VITALS — BP 132/68 | HR 98 | Temp 97.7°F | Resp 18 | Ht 67.0 in | Wt 179.0 lb

## 2018-08-08 DIAGNOSIS — Z8249 Family history of ischemic heart disease and other diseases of the circulatory system: Secondary | ICD-10-CM | POA: Insufficient documentation

## 2018-08-08 DIAGNOSIS — Z79899 Other long term (current) drug therapy: Secondary | ICD-10-CM | POA: Insufficient documentation

## 2018-08-08 DIAGNOSIS — Z86718 Personal history of other venous thrombosis and embolism: Secondary | ICD-10-CM | POA: Insufficient documentation

## 2018-08-08 DIAGNOSIS — I82501 Chronic embolism and thrombosis of unspecified deep veins of right lower extremity: Secondary | ICD-10-CM | POA: Diagnosis not present

## 2018-08-08 DIAGNOSIS — R7303 Prediabetes: Secondary | ICD-10-CM | POA: Diagnosis not present

## 2018-08-08 DIAGNOSIS — B20 Human immunodeficiency virus [HIV] disease: Secondary | ICD-10-CM | POA: Diagnosis not present

## 2018-08-08 DIAGNOSIS — Z7982 Long term (current) use of aspirin: Secondary | ICD-10-CM | POA: Diagnosis not present

## 2018-08-08 DIAGNOSIS — Z7901 Long term (current) use of anticoagulants: Secondary | ICD-10-CM | POA: Diagnosis not present

## 2018-08-08 DIAGNOSIS — Z21 Asymptomatic human immunodeficiency virus [HIV] infection status: Secondary | ICD-10-CM | POA: Diagnosis not present

## 2018-08-08 DIAGNOSIS — F17210 Nicotine dependence, cigarettes, uncomplicated: Secondary | ICD-10-CM | POA: Insufficient documentation

## 2018-08-08 DIAGNOSIS — I10 Essential (primary) hypertension: Secondary | ICD-10-CM | POA: Insufficient documentation

## 2018-08-08 LAB — POCT GLYCOSYLATED HEMOGLOBIN (HGB A1C): Hemoglobin A1C: 6.2 % — AB (ref 4.0–5.6)

## 2018-08-08 LAB — GLUCOSE, POCT (MANUAL RESULT ENTRY): POC Glucose: 135 mg/dl — AB (ref 70–99)

## 2018-08-08 NOTE — Progress Notes (Signed)
Established Patient Office Visit  Subjective:  Patient ID: Juan Johnston, male    DOB: 09/30/1950  Age: 68 y.o. MRN: [id number]  CC:  Chief Complaint  Patient presents with  . Diabetes    Pre    HPI Juan Johnston presents for follow up on HTN and Pre diabetes status.  Past Medical History:  Diagnosis Date  . HIV infection ([place])   . Thyroid goiter     Past Surgical History:  Procedure Laterality Date  . INTRAVASCULAR ULTRASOUND/IVUS N/A 07/21/2017   Procedure: Intravascular Ultrasound/IVUS;  Surgeon: Juan Sandy, MD;  Location: [facility];  Service: Cardiovascular;  Laterality: N/A;  . PERIPHERAL VASCULAR THROMBECTOMY N/A 07/20/2017   Procedure: PERIPHERAL VASCULAR THROMBECTOMY;  Surgeon: Juan Mitchell, MD;  Location: [facility];  Service: Cardiovascular;  Laterality: N/A;  . PERIPHERAL VASCULAR THROMBECTOMY N/A 07/21/2017   Procedure: PERIPHERAL VASCULAR THROMBECTOMY - LYSIS RECHECK;  Surgeon: Juan Sandy, MD;  Location: [facility];  Service: Cardiovascular;  Laterality: N/A;  . scrotum cyst      Family History  Problem Relation Age of Onset  . Hypertension Mother     Social History   Socioeconomic History  . Marital status: Single    Spouse name: Not on file  . Number of children: Not on file  . Years of education: Not on file  . Highest education level: Not on file  Occupational History  . Not on file  Social Needs  . Financial resource strain: Not on file  . Food insecurity:    Worry: Not on file    Inability: Not on file  . Transportation needs:    Medical: Not on file    Non-medical: Not on file  Tobacco Use  . Smoking status: Current Some Day Smoker    Types: Cigarettes    Last attempt to quit: 02/03/2017    Years since quitting: 1.5  . Smokeless tobacco: Never Used  . Tobacco comment: 1 pk lasts 2 days. "Does not inhale"  Substance and Sexual Activity  . Alcohol use: Yes  . Drug use: No   . Sexual activity: Yes    Partners: Male    Birth control/protection: None  Lifestyle  . Physical activity:    Days per week: Not on file    Minutes per session: Not on file  . Stress: Not on file  Relationships  . Social connections:    Talks on phone: Not on file    Gets together: Not on file    Attends religious service: Not on file    Active member of club or organization: Not on file    Attends meetings of clubs or organizations: Not on file    Relationship status: Not on file  . Intimate partner violence:    Fear of current or ex partner: Not on file    Emotionally abused: Not on file    Physically abused: Not on file    Forced sexual activity: Not on file  Other Topics Concern  . Not on file  Social History Narrative  . Not on file    Outpatient Medications Prior to Visit  Medication Sig Dispense Refill  . aspirin 81 MG chewable tablet Chew 1 tablet (81 mg total) by mouth daily. 30 tablet 0  . atorvastatin (LIPITOR) 40 MG tablet Take 1 tablet (40 mg total) by mouth daily. 90 tablet 3  . bictegravir-emtricitabine-tenofovir AF (BIKTARVY) 50-200-25 MG TABS tablet Take 1 tablet by  mouth daily. 30 tablet 5   No facility-administered medications prior to visit.     No Known Allergies  ROS Review of Systems  Constitutional: Negative.   HENT: Negative.   Eyes: Negative.   Respiratory: Negative.   Gastrointestinal: Negative.   Endocrine: Negative.   Genitourinary: Negative.   Musculoskeletal: Negative.   Allergic/Immunologic: Negative.   Neurological: Negative.   Hematological: Negative.   Psychiatric/Behavioral: Negative.       Objective:    Physical Exam  Constitutional: He is oriented to person, place, and time. He appears well-developed and well-nourished.  HENT:  Head: Normocephalic and atraumatic.  Eyes: EOM are normal.  Neck: Normal range of motion.  Cardiovascular: Normal rate and regular rhythm.  Pulmonary/Chest: Breath sounds normal.   Abdominal: Bowel sounds are normal.  Musculoskeletal: Normal range of motion.  Neurological: He is oriented to person, place, and time.  Skin: Skin is warm.  Psychiatric: He has a normal mood and affect.    BP 132/68 (BP Location: Left Arm, Patient Position: Sitting, Cuff Size: Normal)   Pulse 98   Temp 97.7 F (36.5 C) (Oral)   Resp 18   Ht 5\' 7"  (1.702 m)   Wt 179 lb (81.2 kg)   SpO2 100%   BMI 28.04 kg/m  Wt Readings from Last 3 Encounters:  08/08/18 179 lb (81.2 kg)  07/07/18 178 lb (80.7 kg)  05/10/18 176 lb (79.8 kg)     Health Maintenance Due  Topic Date Due  . FOOT EXAM  08/04/1960  . OPHTHALMOLOGY EXAM  08/04/1960  . URINE MICROALBUMIN  08/04/1960  . TETANUS/TDAP  08/04/1969    There are no preventive care reminders to display for this patient.  Lab Results  Component Value Date   TSH 1.79 05/19/2017   Lab Results  Component Value Date   WBC 4.7 07/20/2018   HGB 15.1 07/20/2018   HCT 43.9 07/20/2018   MCV 94.2 07/20/2018   PLT 187 07/20/2018   Lab Results  Component Value Date   NA 139 07/20/2018   K 4.0 07/20/2018   CO2 27 07/20/2018   GLUCOSE 133 (H) 07/20/2018   BUN 18 07/20/2018   CREATININE 0.87 07/20/2018   BILITOT 0.6 07/20/2018   ALKPHOS 62 11/10/2017   AST 16 07/20/2018   ALT 22 07/20/2018   PROT 6.8 07/20/2018   ALBUMIN 4.2 11/10/2017   CALCIUM 10.0 07/20/2018   ANIONGAP 8 07/23/2017   Lab Results  Component Value Date   CHOL 180 07/20/2018   Lab Results  Component Value Date   HDL 51 07/20/2018   Lab Results  Component Value Date   LDLCALC 111 (H) 07/20/2018   Lab Results  Component Value Date   TRIG 85 07/20/2018   Lab Results  Component Value Date   CHOLHDL 3.5 07/20/2018   Lab Results  Component Value Date   HGBA1C 6.2 (A) 08/08/2018      Assessment & Plan:   Problem List Items Addressed This Visit    AIDS (acquired immune deficiency syndrome) ([place])   Leg DVT (deep venous thromboembolism), chronic,  right ([place])    Other Visit Diagnoses    Prediabetes    -  Primary   Relevant Orders   HgB A1c (Completed)   Glucose (CBG) (Completed)   Essential hypertension        Juan Johnston was seen today for diabetes.  Diagnoses and all orders for this visit:  Prediabetes -     HgB A1c 6.2 -  Glucose (CBG)  AIDS (acquired immune deficiency syndrome) (HCC) Followed by ID   Leg DVT (deep venous thromboembolism), chronic, right (HCC) History of DVT was on Xarelto changed to asprin   Essential hypertension 132/62 manuel diet exercise The patient is advised to life style modification does not want Bp medication.   No orders of the defined types were placed in this encounter.   Follow-up: Return in about 6 months (around 02/08/2019) for Pre diabetes .    Kerin Perna, NP

## 2018-08-14 ENCOUNTER — Ambulatory Visit (INDEPENDENT_AMBULATORY_CARE_PROVIDER_SITE_OTHER): Payer: Medicare Other | Admitting: Family

## 2018-08-14 ENCOUNTER — Encounter: Payer: Self-pay | Admitting: Family

## 2018-08-14 ENCOUNTER — Other Ambulatory Visit: Payer: Self-pay

## 2018-08-14 DIAGNOSIS — B20 Human immunodeficiency virus [HIV] disease: Secondary | ICD-10-CM

## 2018-08-14 DIAGNOSIS — E1169 Type 2 diabetes mellitus with other specified complication: Secondary | ICD-10-CM | POA: Diagnosis not present

## 2018-08-14 DIAGNOSIS — Z0000 Encounter for general adult medical examination without abnormal findings: Secondary | ICD-10-CM

## 2018-08-14 DIAGNOSIS — E785 Hyperlipidemia, unspecified: Secondary | ICD-10-CM | POA: Diagnosis not present

## 2018-08-14 MED ORDER — BICTEGRAVIR-EMTRICITAB-TENOFOV 50-200-25 MG PO TABS: 1.0000 | ORAL_TABLET | Freq: Every day | ORAL | 5 refills | Status: DC

## 2018-08-14 NOTE — Progress Notes (Signed)
Subjective:    Patient ID: Juan Johnston, male    DOB: [birth year], 68 y.o.   MRN: [id number]  Chief Complaint  Patient presents with  . HIV Positive/AIDS     Virtual Visit via Telephone Note   I connected with Juan Johnston on 08/14/2018 at 8:45 AM by telephone and verified that I am speaking with the correct person using two identifiers.   I discussed the limitations, risks, security and privacy concerns of performing an evaluation and management service by telephone and the availability of in person appointments. I also discussed with the patient that there may be a patient responsible charge related to this service. The patient expressed understanding and agreed to proceed.   HPI:  Juan Johnston is a 68 y.o. male with HIV/AIDS who was last seen in the office on 04/20/2018 with good adherence and tolerance to his ART regimen of Biktarvy with Bactrim being discontinued at that time given improvements in his CD4 count.  Viral load at the time was 53 with a CD4 count of 230.  Most recent blood work completed on 07/20/2018 with a CD4 count improving to 320 with a viral load of 24 and being undetectable.  Kidney function, liver function, and electrolytes within normal ranges.  Glucose elevated at 133.  Lipid profile with HDL of 51 and LDL of 111 and triglycerides 85.  Most recent A1c completed on 05/11/2019 was 6.2.  RPR was nonreactive.  He completed his Cologuard on 03/08/2017 next due on 03/08/2020.  Juan Johnston has been taking his Biktarvy as prescribed with no adverse side effects or missed doses.  Overall feeling well. Denies fevers, chills, night sweats, headaches, changes in vision, neck pain/stiffness, nausea, diarrhea, vomiting, lesions or rashes.  He remains covered through Medicare/Medicaid and has no problems obtaining his medication from [facility]. He remains retired and has stable housing and access to food. Denied feelings of being down depressed, or hopeless in  the last 2 weeks. No falls.  No recreational or illicit drug use.     Office Visit from 05/10/2018 in [facility]  PHQ-9 Total Score  0      No Known Allergies    Outpatient Medications Prior to Visit  Medication Sig Dispense Refill  . aspirin 81 MG chewable tablet Chew 1 tablet (81 mg total) by mouth daily. 30 tablet 0  . atorvastatin (LIPITOR) 40 MG tablet Take 1 tablet (40 mg total) by mouth daily. 90 tablet 3  . bictegravir-emtricitabine-tenofovir AF (BIKTARVY) 50-200-25 MG TABS tablet Take 1 tablet by mouth daily. 30 tablet 5   No facility-administered medications prior to visit.      Past Medical History:  Diagnosis Date  . HIV infection ([place])   . Thyroid goiter      Past Surgical History:  Procedure Laterality Date  . INTRAVASCULAR ULTRASOUND/IVUS N/A 07/21/2017   Procedure: Intravascular Ultrasound/IVUS;  Surgeon: Waynetta Sandy, MD;  Location: [facility];  Service: Cardiovascular;  Laterality: N/A;  . PERIPHERAL VASCULAR THROMBECTOMY N/A 07/20/2017   Procedure: PERIPHERAL VASCULAR THROMBECTOMY;  Surgeon: Serafina Mitchell, MD;  Location: [facility];  Service: Cardiovascular;  Laterality: N/A;  . PERIPHERAL VASCULAR THROMBECTOMY N/A 07/21/2017   Procedure: PERIPHERAL VASCULAR THROMBECTOMY - LYSIS RECHECK;  Surgeon: Waynetta Sandy, MD;  Location: [facility];  Service: Cardiovascular;  Laterality: N/A;  . scrotum cyst       Review of Systems  Constitutional: Negative for appetite change, chills,  fatigue, fever and unexpected weight change.  Eyes: Negative for visual disturbance.  Respiratory: Negative for cough, chest tightness, shortness of breath and wheezing.   Cardiovascular: Negative for chest pain and leg swelling.  Gastrointestinal: Negative for abdominal pain, constipation, diarrhea, nausea and vomiting.  Genitourinary: Negative for dysuria, flank pain, frequency, genital sores, hematuria and  urgency.  Skin: Negative for rash.  Allergic/Immunologic: Negative for immunocompromised state.  Neurological: Negative for dizziness and headaches.      Objective:    Nursing note and vital signs reviewed.    Juan Johnston is pleasant to speak with and sounds to be doing well.  Assessment & Plan:   Problem List Items Addressed This Visit      Endocrine   Hyperlipidemia associated with type 2 diabetes mellitus ([place])    Stable with most recent A1c of 6.2 and managed by primary care. Encouraged continued good control of blood sugars and lipids as when combined with HIV disease increases risk for cardiovascular and renal disease in the future.         Other   AIDS (acquired immune deficiency syndrome) ([place]) - Primary    Juan Johnston has well controlled HIV/AIDS with good adherence and tolerance to his Biktarvy. He has no symptoms of opportunistic infection and has no problems obtaining his medications. Lab work reviewed with Juan Johnston today and questions answered. Plan to continue current dose of Biktarvy with follow up office visit in 3 months or sooner if needed and lab work to be completed same day.       Relevant Medications   bictegravir-emtricitabine-tenofovir AF (BIKTARVY) 50-200-25 MG TABS tablet   Healthcare maintenance     Due for diabetic foot exam and eye exam  Colon cancer screening up to date  Declines dental referral today.  Discussed importance of safe sexual practice to reduce risk of transmission/acquisition of STI.           I am having Juan Johnston maintain his aspirin, atorvastatin, and bictegravir-emtricitabine-tenofovir AF.   Meds ordered this encounter  Medications  . bictegravir-emtricitabine-tenofovir AF (BIKTARVY) 50-200-25 MG TABS tablet    Sig: Take 1 tablet by mouth daily.    Dispense:  30 tablet    Refill:  5    Order Specific Question:   Supervising Provider    Answer:   Carlyle Basques [phone]     I discussed the  assessment and treatment plan with the patient. The patient was provided an opportunity to ask questions and all were answered. The patient agreed with the plan and demonstrated an understanding of the instructions.   The patient was advised to call back or seek an in-person evaluation if the symptoms worsen or if the condition fails to improve as anticipated.   I provided  12  minutes of non-face-to-face time during this encounter.  Follow-up: Return in about 3 months (around 11/14/2018), or if symptoms worsen or fail to improve.   Terri Piedra, MSN, FNP-C Nurse Practitioner [facility] [place] number: [phone]

## 2018-08-14 NOTE — Assessment & Plan Note (Signed)
Juan Johnston has well controlled HIV/AIDS with good adherence and tolerance to his Biktarvy. He has no symptoms of opportunistic infection and has no problems obtaining his medications. Lab work reviewed with Juan Johnston today and questions answered. Plan to continue current dose of Biktarvy with follow up office visit in 3 months or sooner if needed and lab work to be completed same day.

## 2018-08-14 NOTE — Assessment & Plan Note (Signed)
   Due for diabetic foot exam and eye exam  Colon cancer screening up to date  Declines dental referral today.  Discussed importance of safe sexual practice to reduce risk of transmission/acquisition of STI.

## 2018-08-14 NOTE — Assessment & Plan Note (Signed)
Stable with most recent A1c of 6.2 and managed by primary care. Encouraged continued good control of blood sugars and lipids as when combined with HIV disease increases risk for cardiovascular and renal disease in the future.

## 2018-08-14 NOTE — Patient Instructions (Signed)
Nice to speak with you.  Please continue to take your [place] as prescribed.  Plan for follow up in 3 months or sooner if needed with lab work same day.

## 2018-10-19 ENCOUNTER — Ambulatory Visit: Payer: Medicare Other

## 2018-10-19 ENCOUNTER — Other Ambulatory Visit: Payer: Self-pay

## 2018-10-30 ENCOUNTER — Other Ambulatory Visit (INDEPENDENT_AMBULATORY_CARE_PROVIDER_SITE_OTHER): Payer: Self-pay | Admitting: Primary Care

## 2018-10-30 MED ORDER — ATORVASTATIN CALCIUM 20 MG PO TABS: 20.0000 mg | ORAL_TABLET | Freq: Every day | ORAL | 1 refills | Status: DC

## 2018-11-13 ENCOUNTER — Telehealth: Payer: Self-pay | Admitting: Family

## 2018-11-13 NOTE — Telephone Encounter (Signed)
COVID-19 Pre-Screening Questions: ° °Do you currently have a fever (>100 °F), chills or unexplained body aches? N ° °Are you currently experiencing new cough, shortness of breath, sore throat, runny nose? N °•  °Have you recently travelled outside the state of [place] in the last 14 days? N  °•  °Have you been in contact with someone that is currently pending confirmation of Covid19 testing or has been confirmed to have the Covid19 virus?  N ° °**If the patient answers NO to ALL questions -  advise the patient to please call the clinic before coming to the office should any symptoms develop.  ° ° ° °

## 2018-11-14 ENCOUNTER — Encounter: Payer: Self-pay | Admitting: Family

## 2018-11-14 ENCOUNTER — Other Ambulatory Visit: Payer: Self-pay

## 2018-11-14 ENCOUNTER — Ambulatory Visit (INDEPENDENT_AMBULATORY_CARE_PROVIDER_SITE_OTHER): Payer: Medicare Other | Admitting: Family

## 2018-11-14 VITALS — BP 156/83 | HR 77 | Temp 98.3°F

## 2018-11-14 DIAGNOSIS — Z0000 Encounter for general adult medical examination without abnormal findings: Secondary | ICD-10-CM | POA: Diagnosis not present

## 2018-11-14 DIAGNOSIS — Z113 Encounter for screening for infections with a predominantly sexual mode of transmission: Secondary | ICD-10-CM

## 2018-11-14 DIAGNOSIS — Z21 Asymptomatic human immunodeficiency virus [HIV] infection status: Secondary | ICD-10-CM

## 2018-11-14 MED ORDER — BIKTARVY 50-200-25 MG PO TABS: 1.0000 | ORAL_TABLET | Freq: Every day | ORAL | 5 refills | Status: DC

## 2018-11-14 NOTE — Assessment & Plan Note (Signed)
Mr. Elsen has well-controlled HIV disease with good adherence and tolerance to his ART regimen of Biktarvy.  He has no signs/symptoms of opportunistic infection or progressive HIV disease at present.  Financial assistance renewed and no problems obtaining medication from the pharmacy.  Check blood work today.  Continue current dose of Biktarvy.  Plan for follow-up in 4 months or sooner if needed with lab work 1 to 2 weeks prior to appointment.

## 2018-11-14 NOTE — Patient Instructions (Signed)
Nice to see you!  Please continue to take your [place] as prescribed daily.  We will check your blood work today.  Refills have been sent to the pharmacy.  Plan for follow up in 4 months or sooner if needed with lab work 1-2 weeks prior to appointment.  Have a great day and stay safe!

## 2018-11-14 NOTE — Assessment & Plan Note (Signed)
   Immunizations up-to-date per recommendations.  Recommend flu vaccination in September  Awaiting dental referral  Discussed importance of safe sexual practice to reduce risk of acquisition/transmission of STI.

## 2018-11-14 NOTE — Progress Notes (Signed)
Subjective:    Patient ID: Juan Johnston, male    DOB: 05-23-1950, 68 y.o.   MRN: [id number]  Chief Complaint  Patient presents with  . HIV Positive/AIDS     HPI:  Juan Johnston is a 68 y.o. male with HIV disease last seen in the office on 08/14/2018 4E visit with good adherence and tolerance to his ART regimen of Biktarvy.  Viral load at the time was 24 with a CD4 count of 320.  No recent blood work completed.  Healthcare maintenance due includes a dental screening.  Juan Johnston continues to take his Biktarvy as prescribed no adverse side effects or missed doses.  Overall feeling well today with no new concerns/complaints.Denies fevers, chills, night sweats, headaches, changes in vision, neck pain/stiffness, nausea, diarrhea, vomiting, lesions or rashes.  Juan Johnston has no problems obtaining his medication from the pharmacy and remains covered through Medicare and financial assistance with [employer] which has been recently renewed.  Denies feelings of being down, depressed, or hopeless recently.  No recreational or illicit drug use, tobacco use, or alcohol consumption.  Not currently sexually active.  No Known Allergies    Outpatient Medications Prior to Visit  Medication Sig Dispense Refill  . aspirin 81 MG chewable tablet Chew 1 tablet (81 mg total) by mouth daily. 30 tablet 0  . atorvastatin (LIPITOR) 20 MG tablet Take 1 tablet (20 mg total) by mouth daily. 90 tablet 1  . bictegravir-emtricitabine-tenofovir AF (BIKTARVY) 50-200-25 MG TABS tablet Take 1 tablet by mouth daily. 30 tablet 5   No facility-administered medications prior to visit.      Past Medical History:  Diagnosis Date  . HIV infection ([place])   . Thyroid goiter      Past Surgical History:  Procedure Laterality Date  . INTRAVASCULAR ULTRASOUND/IVUS N/A 07/21/2017   Procedure: Intravascular Ultrasound/IVUS;  Surgeon: Waynetta Sandy, MD;  Location: [facility];  Service: Cardiovascular;   Laterality: N/A;  . PERIPHERAL VASCULAR THROMBECTOMY N/A 07/20/2017   Procedure: PERIPHERAL VASCULAR THROMBECTOMY;  Surgeon: Serafina Mitchell, MD;  Location: [facility];  Service: Cardiovascular;  Laterality: N/A;  . PERIPHERAL VASCULAR THROMBECTOMY N/A 07/21/2017   Procedure: PERIPHERAL VASCULAR THROMBECTOMY - LYSIS RECHECK;  Surgeon: Waynetta Sandy, MD;  Location: [facility];  Service: Cardiovascular;  Laterality: N/A;  . scrotum cyst       Review of Systems  Constitutional: Negative for appetite change, chills, fatigue, fever and unexpected weight change.  Eyes: Negative for visual disturbance.  Respiratory: Negative for cough, chest tightness, shortness of breath and wheezing.   Cardiovascular: Negative for chest pain and leg swelling.  Gastrointestinal: Negative for abdominal pain, constipation, diarrhea, nausea and vomiting.  Genitourinary: Negative for dysuria, flank pain, frequency, genital sores, hematuria and urgency.  Skin: Negative for rash.  Allergic/Immunologic: Negative for immunocompromised state.  Neurological: Negative for dizziness and headaches.      Objective:    BP (!) 156/83   Pulse 77   Temp 98.3 F (36.8 C) (Oral)  Nursing note and vital signs reviewed.  Physical Exam Constitutional:      General: He is not in acute distress.    Appearance: He is well-developed.  Eyes:     Conjunctiva/sclera: Conjunctivae normal.  Neck:     Musculoskeletal: Neck supple.  Cardiovascular:     Rate and Rhythm: Normal rate and regular rhythm.     Heart sounds: Normal heart sounds. No murmur. No friction rub. No gallop.  Pulmonary:     Effort: Pulmonary effort is normal. No respiratory distress.     Breath sounds: Normal breath sounds. No wheezing or rales.  Chest:     Chest wall: No tenderness.  Abdominal:     General: Bowel sounds are normal.     Palpations: Abdomen is soft.     Tenderness: There is no abdominal tenderness.   Lymphadenopathy:     Cervical: No cervical adenopathy.  Skin:    General: Skin is warm and dry.     Findings: No rash.  Neurological:     Mental Status: He is alert and oriented to person, place, and time.  Psychiatric:        Behavior: Behavior normal.        Thought Content: Thought content normal.        Judgment: Judgment normal.      Depression screen [facility] 2/9 11/14/2018 08/08/2018 05/10/2018 04/20/2018 01/17/2018  Decreased Interest 0 0 0 0 0  Down, Depressed, Hopeless 0 0 0 0 0  PHQ - 2 Score 0 0 0 0 0  Altered sleeping - - 0 - -  Tired, decreased energy - - 0 - -  Change in appetite - - 0 - -  Feeling bad or failure about yourself  - - 0 - -  Trouble concentrating - - 0 - -  Moving slowly or fidgety/restless - - 0 - -  Suicidal thoughts - - 0 - -  PHQ-9 Score - - 0 - -       Assessment & Plan:   Problem List Items Addressed This Visit      Other   Healthcare maintenance     Immunizations up-to-date per recommendations.  Recommend flu vaccination in September  Awaiting dental referral  Discussed importance of safe sexual practice to reduce risk of acquisition/transmission of STI.      Asymptomatic HIV infection ([place]) - Primary    Juan Johnston has well-controlled HIV disease with good adherence and tolerance to his ART regimen of Biktarvy.  He has no signs/symptoms of opportunistic infection or progressive HIV disease at present.  Financial assistance renewed and no problems obtaining medication from the pharmacy.  Check blood work today.  Continue current dose of Biktarvy.  Plan for follow-up in 4 months or sooner if needed with lab work 1 to 2 weeks prior to appointment.      Relevant Medications   bictegravir-emtricitabine-tenofovir AF (BIKTARVY) 50-200-25 MG TABS tablet   Other Relevant Orders   HIV-1 RNA quant-no reflex-bld   T-helper cell (CD4)- ([facility] clinic only)   COMPLETE METABOLIC PANEL WITH GFR   HIV-1 RNA quant-no reflex-bld   CBC   T-helper cell  (CD4)- ([facility] clinic only)   Comprehensive metabolic panel    Other Visit Diagnoses    Screening for STDs (sexually transmitted diseases)       Relevant Orders   RPR       I am having Juan Johnston maintain his aspirin, atorvastatin, and Biktarvy.   Meds ordered this encounter  Medications  . bictegravir-emtricitabine-tenofovir AF (BIKTARVY) 50-200-25 MG TABS tablet    Sig: Take 1 tablet by mouth daily.    Dispense:  30 tablet    Refill:  5    Order Specific Question:   Supervising Provider    Answer:   Carlyle Basques [[id number]]     Follow-up: Return in about 4 months (around 03/16/2019), or if symptoms worsen or fail to improve.   Terri Piedra, MSN,  FNP-C Nurse Practitioner [facility] [place] number: [phone]

## 2018-11-15 LAB — T-HELPER CELL (CD4) - (RCID CLINIC ONLY)
CD4 % Helper T Cell: 19 % — ABNORMAL LOW (ref 33–65)
CD4 T Cell Abs: 287 /uL — ABNORMAL LOW (ref 400–1790)

## 2018-11-17 LAB — COMPLETE METABOLIC PANEL WITH GFR
AG Ratio: 1.6 (calc) (ref 1.0–2.5)
ALT: 26 U/L (ref 9–46)
AST: 18 U/L (ref 10–35)
Albumin: 4.2 g/dL (ref 3.6–5.1)
Alkaline phosphatase (APISO): 71 U/L (ref 35–144)
BUN: 23 mg/dL (ref 7–25)
CO2: 25 mmol/L (ref 20–32)
Calcium: 9.6 mg/dL (ref 8.6–10.3)
Chloride: 106 mmol/L (ref 98–110)
Creat: 1.09 mg/dL (ref 0.70–1.25)
GFR, Est African American: 80 mL/min/{1.73_m2} (ref >=60)
GFR, Est Non African American: 69 mL/min/{1.73_m2} (ref >=60)
Globulin: 2.6 g/dL (calc) (ref 1.9–3.7)
Glucose, Bld: 138 mg/dL — ABNORMAL HIGH (ref 65–99)
Potassium: 4.3 mmol/L (ref 3.5–5.3)
Sodium: 141 mmol/L (ref 135–146)
Total Bilirubin: 0.5 mg/dL (ref 0.2–1.2)
Total Protein: 6.8 g/dL (ref 6.1–8.1)

## 2018-11-17 LAB — HIV-1 RNA QUANT-NO REFLEX-BLD
HIV 1 RNA Quant: <20 copies/mL — AB
HIV-1 RNA Quant, Log: <1.3 Log copies/mL — AB

## 2018-11-20 ENCOUNTER — Telehealth: Payer: Self-pay | Admitting: *Deleted

## 2018-11-20 NOTE — Telephone Encounter (Signed)
-----   Message from [place], [place] sent at 11/20/2018 11:48 AM EDT ----- Please inform Mr. Polhamus that his viral load remains undetectable and CD4 count is 287. Continue to take Biktarvy as prescribed and follow up in 4 months or sooner if needed.

## 2018-11-20 NOTE — Telephone Encounter (Signed)
Patient returned call. Made aware of lab results and scheduled for 4 month follow up visit with labs.

## 2018-11-20 NOTE — Telephone Encounter (Signed)
Left message on patient-identified voicemail asking him to call back for lab results and to schedule his 4 month follow up visit (not yet scheduled). Landis Gandy, RN

## 2018-11-30 ENCOUNTER — Encounter (INDEPENDENT_AMBULATORY_CARE_PROVIDER_SITE_OTHER): Payer: Self-pay | Admitting: Primary Care

## 2018-11-30 ENCOUNTER — Ambulatory Visit (INDEPENDENT_AMBULATORY_CARE_PROVIDER_SITE_OTHER): Payer: Medicare Other | Admitting: Primary Care

## 2018-11-30 ENCOUNTER — Other Ambulatory Visit: Payer: Medicare Other

## 2018-11-30 ENCOUNTER — Ambulatory Visit (INDEPENDENT_AMBULATORY_CARE_PROVIDER_SITE_OTHER): Payer: Medicare Other | Admitting: *Deleted

## 2018-11-30 ENCOUNTER — Other Ambulatory Visit: Payer: Self-pay

## 2018-11-30 VITALS — BP 144/90 | HR 71 | Temp 97.7°F | Ht 67.0 in | Wt 174.4 lb

## 2018-11-30 DIAGNOSIS — Z23 Encounter for immunization: Secondary | ICD-10-CM | POA: Diagnosis not present

## 2018-11-30 DIAGNOSIS — Z113 Encounter for screening for infections with a predominantly sexual mode of transmission: Secondary | ICD-10-CM

## 2018-11-30 DIAGNOSIS — A530 Latent syphilis, unspecified as early or late: Secondary | ICD-10-CM | POA: Diagnosis not present

## 2018-11-30 DIAGNOSIS — A64 Unspecified sexually transmitted disease: Secondary | ICD-10-CM

## 2018-11-30 MED ORDER — PENICILLIN G BENZATHINE 1200000 UNIT/2ML IM SUSP: 1.2000 10*6.[IU] | Freq: Once | INTRAMUSCULAR | Status: DC

## 2018-11-30 MED ORDER — PENICILLIN G BENZATHINE 1200000 UNIT/2ML IM SUSP
1.2000 10*6.[IU] | Freq: Once | INTRAMUSCULAR | Status: AC
  Administered 2018-11-30: 1.2 10*6.[IU] via INTRAMUSCULAR

## 2018-11-30 MED ORDER — PENICILLIN G BENZATHINE 1200000 UNIT/2ML IM SUSP
1.2000 10*6.[IU] | Freq: Once | INTRAMUSCULAR | Status: AC
  Administered 2018-11-30: 15:00:00 1.2 10*6.[IU] via INTRAMUSCULAR

## 2018-11-30 NOTE — Progress Notes (Signed)
Established Patient Office Visit  Subjective:  Patient ID: Juan Johnston, male    DOB: 1950-06-23  Age: 68 y.o. MRN: [id number]  CC: No chief complaint on file.   HPI Juan Johnston presents for blisters on his right big toe and bilateral bottom of his feet. Patient is immune compromise with HIV being treated and followed by infectious disease. Reviewed his previous RPR test and they have been negative . 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 Patient admits to having syphilis earlier in life.    Past Medical History:  Diagnosis Date  . HIV infection ([place])   . Thyroid goiter     Past Surgical History:  Procedure Laterality Date  . INTRAVASCULAR ULTRASOUND/IVUS N/A 07/21/2017   Procedure: Intravascular Ultrasound/IVUS;  Surgeon: Waynetta Sandy, MD;  Location: [facility];  Service: Cardiovascular;  Laterality: N/A;  . PERIPHERAL VASCULAR THROMBECTOMY N/A 07/20/2017   Procedure: PERIPHERAL VASCULAR THROMBECTOMY;  Surgeon: Serafina Mitchell, MD;  Location: [facility];  Service: Cardiovascular;  Laterality: N/A;  . PERIPHERAL VASCULAR THROMBECTOMY N/A 07/21/2017   Procedure: PERIPHERAL VASCULAR THROMBECTOMY - LYSIS RECHECK;  Surgeon: Waynetta Sandy, MD;  Location: [facility];  Service: Cardiovascular;  Laterality: N/A;  . scrotum cyst      Family History  Problem Relation Age of Onset  . Hypertension Mother     Social History   Socioeconomic History  . Marital status: Single    Spouse name: Not on file  . Number of children: Not on file  . Years of education: Not on file  . Highest education level: Not on file  Occupational History  . Not on file  Social Needs  . Financial resource strain: Not on file  . Food insecurity    Worry: Not on file    Inability: Not on file  . Transportation needs    Medical: Not on file    Non-medical: Not on file  Tobacco Use  . Smoking status: Current Some Day Smoker   Types: Cigarettes    Last attempt to quit: 02/03/2017    Years since quitting: 1.8  . Smokeless tobacco: Never Used  . Tobacco comment: 1 pk lasts 2 days. "Does not inhale"  Substance and Sexual Activity  . Alcohol use: Yes  . Drug use: No  . Sexual activity: Yes    Partners: Male    Birth control/protection: None  Lifestyle  . Physical activity    Days per week: Not on file    Minutes per session: Not on file  . Stress: Not on file  Relationships  . Social Herbalist on phone: Not on file    Gets together: Not on file    Attends religious service: Not on file    Active member of club or organization: Not on file    Attends meetings of clubs or organizations: Not on file    Relationship status: Not on file  . Intimate partner violence    Fear of current or ex partner: Not on file    Emotionally abused: Not on file    Physically abused: Not on file    Forced sexual activity: Not on file  Other Topics Concern  . Not on file  Social History Narrative  . Not on file    Outpatient Medications Prior to Visit  Medication Sig Dispense Refill  . aspirin 81 MG chewable tablet Chew 1 tablet (81 mg total) by mouth daily.  30 tablet 0  . atorvastatin (LIPITOR) 20 MG tablet Take 1 tablet (20 mg total) by mouth daily. 90 tablet 1  . bictegravir-emtricitabine-tenofovir AF (BIKTARVY) 50-200-25 MG TABS tablet Take 1 tablet by mouth daily. 30 tablet 5   No facility-administered medications prior to visit.     No Known Allergies  ROS Review of Systems  Skin: Positive for color change and rash.  All other systems reviewed and are negative.     Objective:    Physical Exam  Constitutional: He is oriented to person, place, and time. He appears well-developed and well-nourished.  HENT:  Head: Normocephalic.  Neck: Neck supple.  Cardiovascular: Normal rate and regular rhythm.  Pulmonary/Chest: Effort normal and breath sounds normal.  Abdominal: Soft. Bowel sounds are  normal. He exhibits distension.  Musculoskeletal: Normal range of motion.  Neurological: He is oriented to person, place, and time.  Skin: Rash noted.  Psychiatric: He has a normal mood and affect.    BP (!) 144/90 (BP Location: Right Arm, Patient Position: Sitting, Cuff Size: Normal)   Pulse 71   Temp 97.7 F (36.5 C) (Tympanic)   Ht 5\' 7"  (1.702 m)   Wt 174 lb 6.4 oz (79.1 kg)   SpO2 97%   BMI 27.31 kg/m  Wt Readings from Last 3 Encounters:  11/30/18 174 lb 6.4 oz (79.1 kg)  08/08/18 179 lb (81.2 kg)  07/07/18 178 lb (80.7 kg)     Health Maintenance Due  Topic Date Due  . FOOT EXAM  08/04/1960  . OPHTHALMOLOGY EXAM  08/04/1960  . URINE MICROALBUMIN  08/04/1960  . TETANUS/TDAP  08/04/1969  . INFLUENZA VACCINE  11/04/2018    There are no preventive care reminders to display for this patient.  Lab Results  Component Value Date   TSH 1.79 05/19/2017   Lab Results  Component Value Date   WBC 4.7 07/20/2018   HGB 15.1 07/20/2018   HCT 43.9 07/20/2018   MCV 94.2 07/20/2018   PLT 187 07/20/2018   Lab Results  Component Value Date   NA 141 11/14/2018   K 4.3 11/14/2018   CO2 25 11/14/2018   GLUCOSE 138 (H) 11/14/2018   BUN 23 11/14/2018   CREATININE 1.09 11/14/2018   BILITOT 0.5 11/14/2018   ALKPHOS 62 11/10/2017   AST 18 11/14/2018   ALT 26 11/14/2018   PROT 6.8 11/14/2018   ALBUMIN 4.2 11/10/2017   CALCIUM 9.6 11/14/2018   ANIONGAP 8 07/23/2017   Lab Results  Component Value Date   CHOL 180 07/20/2018   Lab Results  Component Value Date   HDL 51 07/20/2018   Lab Results  Component Value Date   LDLCALC 111 (H) 07/20/2018   Lab Results  Component Value Date   TRIG 85 07/20/2018   Lab Results  Component Value Date   CHOLHDL 3.5 07/20/2018   Lab Results  Component Value Date   HGBA1C 6.2 (A) 08/08/2018        Assessment & Plan:   Problem List Items Addressed This Visit    None    Visit Diagnoses    Need for Tdap vaccination    -   Primary   Relevant Orders   Tdap vaccine greater than or equal to 7yo IM (Completed)   Need for immunization against influenza       Relevant Orders   Flu Vaccine QUAD 36+ mos IM (Completed)   Syphili, latent        Diagnoses and all orders for  this visit:  Need for Tdap vaccination -     Tdap vaccine greater than or equal to 7yo IM  Need for immunization against influenza -     Flu Vaccine QUAD 36+ mos IM  Syphili, latent    Orders are placed in the computer for [facility] at Infectious disease. Encourage patient to have labs drawn and follow up with ID. No orders of the defined types were placed in this encounter.   Follow-up: Return if symptoms worsen or fail to improve.    Kerin Perna, NP

## 2018-11-30 NOTE — Progress Notes (Unsigned)
Per Verbal from Dr Baxter Flattery patient is being treated for latent Syphillis and he received the first of 3 doses. Patient is aware of the need for the next 2 sets of shots.

## 2018-11-30 NOTE — Patient Instructions (Signed)
Preventing Diabetes Mellitus Complications You can take action to prevent or slow down problems that are caused by diabetes (diabetes mellitus). Following your diabetes plan and taking care of yourself can reduce your risk of serious or life-threatening complications. What actions can I take to prevent diabetes complications? Manage your diabetes   Follow instructions from your health care providers about managing your diabetes. Your diabetes may be managed by a team of health care providers who can teach you how to care for yourself and can answer questions that you have.  Educate yourself about your condition so you can make healthy choices about eating and physical activity.  Check your blood sugar (glucose) levels as often as directed. Your health care provider will help you decide how often to check your blood glucose level depending on your treatment goals and how well you are meeting them.  Ask your health care provider if you should take low-dose aspirin daily and what dose is recommended for you. Taking low-dose aspirin daily is recommended to help prevent cardiovascular disease. Do not use nicotine or tobacco Do not use any products that contain nicotine or tobacco, such as cigarettes and e-cigarettes. If you need help quitting, ask your health care provider. Nicotine raises your risk for diabetes problems. If you quit using nicotine:  You will lower your risk for heart attack, stroke, nerve disease, and kidney disease.  Your cholesterol and blood pressure may improve.  Your blood circulation will improve. Keep your blood pressure under control Your personal target blood pressure is determined based on:  Your age.  Your medicines.  How long you have had diabetes.  Any other medical conditions you have. To control your blood pressure:  Follow instructions from your health care provider about meal planning, exercise, and medicines.  Make sure your health care provider  checks your blood pressure at every medical visit.  Monitor your blood pressure at home as told by your health care provider.  Keep your cholesterol under control To control your cholesterol:  Follow instructions from your health care provider about meal planning, exercise, and medicines.  Have your cholesterol checked at least once a year.  You may be prescribed medicine to lower cholesterol (statin). If you are not taking a statin, ask your health care provider if you should be. Controlling your cholesterol may:  Help prevent heart disease and stroke. These are the most common health problems for people with diabetes.  Improve your blood flow. Schedule and keep yearly physical exams and eye exams Your health care provider will tell you how often you need medical visits depending on your diabetes management plan. Keep all follow-up visits as directed. This is important so possible problems can be identified early and complications can be avoided or treated.  Every visit with your health care provider should include measuring your: ? Weight. ? Blood pressure. ? Blood glucose control.  Your A1c (hemoglobin A1c) level should be checked: ? At least 2 times a year, if you are meeting your treatment goals. ? 4 times a year, if you are not meeting treatment goals or if your treatment goals have changed.  Your blood lipids (lipid profile) should be checked yearly. You should also be checked yearly for protein in your urine (urine microalbumin).  If you have type 1 diabetes, get an eye exam 3-5 years after you are diagnosed, and then once a year after your first exam.  If you have type 2 diabetes, get an eye exam as soon as you  are diagnosed, and then once a year after your first exam. Keep your vaccines current It is recommended that you receive:  A flu (influenza) vaccine every year.  A pneumonia (pneumococcal) vaccine and a hepatitis B vaccine. If you are age 46 or older, you may  get the pneumonia vaccine as a series of two separate shots. Ask your health care provider which other vaccines may be recommended. Take care of your feet Diabetes may cause you to have poor blood circulation to your legs and feet. Because of this, taking care of your feet is very important. Diabetes can cause:  The skin on the feet to get thinner, break more easily, and heal more slowly.  Nerve damage in your legs and feet, which results in decreased feeling. You may not notice minor injuries that could lead to serious problems. To avoid foot problems:  Check your skin and feet every day for cuts, bruises, redness, blisters, or sores.  Schedule a foot exam with your health care provider once every year. This exam includes: ? Inspecting of the structure and skin of your feet. ? Checking the pulses and sensation in your feet.  Make sure that your health care provider performs a visual foot exam at every medical visit.  Take care of your teeth People with poorly controlled diabetes are more likely to have gum (periodontal) disease. Diabetes can make periodontal diseases harder to control. If not treated, periodontal diseases can lead to tooth loss. To prevent this:  Brush your teeth twice a day.  Floss at least once a day.  Visit your dentist 2 times a year. Drink responsibly Limit alcohol intake to no more than 1 drink a day for nonpregnant women and 2 drinks a day for men. One drink equals 12 oz of beer, 5 oz of wine, or 1 oz of hard liquor.  It is important to eat food when you drink alcohol to avoid low blood glucose (hypoglycemia). Avoid alcohol if you:  Have a history of alcohol abuse or dependence.  Are pregnant.  Have liver disease, pancreatitis, advanced neuropathy, or severe hypertriglyceridemia. Lessen stress Living with diabetes can be stressful. When you are experiencing stress, your blood glucose may be affected in two ways:  Stress hormones may cause your blood  glucose to rise.  You may be distracted from taking good care of yourself. Be aware of your stress level and make changes to help you manage challenging situations. To lower your stress levels:  Consider joining a support group.  Do planned relaxation or meditation.  Do a hobby that you enjoy.  Maintain healthy relationships.  Exercise regularly.  Work with your health care provider or a mental health professional. Summary  You can take action to prevent or slow down problems that are caused by diabetes (diabetes mellitus). Following your diabetes plan and taking care of yourself can reduce your risk of serious or life-threatening complications.  Follow instructions from your health care providers about managing your diabetes. Your diabetes may be managed by a team of health care providers who can teach you how to care for yourself and can answer questions that you have.  Your health care provider will tell you how often you need medical visits depending on your diabetes management plan. Keep all follow-up visits as directed. This is important so possible problems can be identified early and complications can be avoided or treated. This information is not intended to replace advice given to you by your health care provider. Make sure  you discuss any questions you have with your health care provider. Document Released: 12/08/2010 Document Revised: 06/20/2017 Document Reviewed: 12/20/2015 Elsevier Patient Education  2020 [employer].  Preventing Diabetes Mellitus Complications You can take action to prevent or slow down problems that are caused by diabetes (diabetes mellitus). Following your diabetes plan and taking care of yourself can reduce your risk of serious or life-threatening complications. What actions can I take to prevent diabetes complications? Manage your diabetes   Follow instructions from your health care providers about managing your diabetes. Your diabetes may be  managed by a team of health care providers who can teach you how to care for yourself and can answer questions that you have.  Educate yourself about your condition so you can make healthy choices about eating and physical activity.  Check your blood sugar (glucose) levels as often as directed. Your health care provider will help you decide how often to check your blood glucose level depending on your treatment goals and how well you are meeting them.  Ask your health care provider if you should take low-dose aspirin daily and what dose is recommended for you. Taking low-dose aspirin daily is recommended to help prevent cardiovascular disease. Do not use nicotine or tobacco Do not use any products that contain nicotine or tobacco, such as cigarettes and e-cigarettes. If you need help quitting, ask your health care provider. Nicotine raises your risk for diabetes problems. If you quit using nicotine:  You will lower your risk for heart attack, stroke, nerve disease, and kidney disease.  Your cholesterol and blood pressure may improve.  Your blood circulation will improve. Keep your blood pressure under control Your personal target blood pressure is determined based on:  Your age.  Your medicines.  How long you have had diabetes.  Any other medical conditions you have. To control your blood pressure:  Follow instructions from your health care provider about meal planning, exercise, and medicines.  Make sure your health care provider checks your blood pressure at every medical visit.  Monitor your blood pressure at home as told by your health care provider.  Keep your cholesterol under control To control your cholesterol:  Follow instructions from your health care provider about meal planning, exercise, and medicines.  Have your cholesterol checked at least once a year.  You may be prescribed medicine to lower cholesterol (statin). If you are not taking a statin, ask your health  care provider if you should be. Controlling your cholesterol may:  Help prevent heart disease and stroke. These are the most common health problems for people with diabetes.  Improve your blood flow. Schedule and keep yearly physical exams and eye exams Your health care provider will tell you how often you need medical visits depending on your diabetes management plan. Keep all follow-up visits as directed. This is important so possible problems can be identified early and complications can be avoided or treated.  Every visit with your health care provider should include measuring your: ? Weight. ? Blood pressure. ? Blood glucose control.  Your A1c (hemoglobin A1c) level should be checked: ? At least 2 times a year, if you are meeting your treatment goals. ? 4 times a year, if you are not meeting treatment goals or if your treatment goals have changed.  Your blood lipids (lipid profile) should be checked yearly. You should also be checked yearly for protein in your urine (urine microalbumin).  If you have type 1 diabetes, get an eye exam 3-5  years after you are diagnosed, and then once a year after your first exam.  If you have type 2 diabetes, get an eye exam as soon as you are diagnosed, and then once a year after your first exam. Keep your vaccines current It is recommended that you receive:  A flu (influenza) vaccine every year.  A pneumonia (pneumococcal) vaccine and a hepatitis B vaccine. If you are age 67 or older, you may get the pneumonia vaccine as a series of two separate shots. Ask your health care provider which other vaccines may be recommended. Take care of your feet Diabetes may cause you to have poor blood circulation to your legs and feet. Because of this, taking care of your feet is very important. Diabetes can cause:  The skin on the feet to get thinner, break more easily, and heal more slowly.  Nerve damage in your legs and feet, which results in decreased  feeling. You may not notice minor injuries that could lead to serious problems. To avoid foot problems:  Check your skin and feet every day for cuts, bruises, redness, blisters, or sores.  Schedule a foot exam with your health care provider once every year. This exam includes: ? Inspecting of the structure and skin of your feet. ? Checking the pulses and sensation in your feet.  Make sure that your health care provider performs a visual foot exam at every medical visit.  Take care of your teeth People with poorly controlled diabetes are more likely to have gum (periodontal) disease. Diabetes can make periodontal diseases harder to control. If not treated, periodontal diseases can lead to tooth loss. To prevent this:  Brush your teeth twice a day.  Floss at least once a day.  Visit your dentist 2 times a year. Drink responsibly Limit alcohol intake to no more than 1 drink a day for nonpregnant women and 2 drinks a day for men. One drink equals 12 oz of beer, 5 oz of wine, or 1 oz of hard liquor.  It is important to eat food when you drink alcohol to avoid low blood glucose (hypoglycemia). Avoid alcohol if you:  Have a history of alcohol abuse or dependence.  Are pregnant.  Have liver disease, pancreatitis, advanced neuropathy, or severe hypertriglyceridemia. Lessen stress Living with diabetes can be stressful. When you are experiencing stress, your blood glucose may be affected in two ways:  Stress hormones may cause your blood glucose to rise.  You may be distracted from taking good care of yourself. Be aware of your stress level and make changes to help you manage challenging situations. To lower your stress levels:  Consider joining a support group.  Do planned relaxation or meditation.  Do a hobby that you enjoy.  Maintain healthy relationships.  Exercise regularly.  Work with your health care provider or a mental health professional. Summary  You can take action  to prevent or slow down problems that are caused by diabetes (diabetes mellitus). Following your diabetes plan and taking care of yourself can reduce your risk of serious or life-threatening complications.  Follow instructions from your health care providers about managing your diabetes. Your diabetes may be managed by a team of health care providers who can teach you how to care for yourself and can answer questions that you have.  Your health care provider will tell you how often you need medical visits depending on your diabetes management plan. Keep all follow-up visits as directed. This is important so possible problems  can be identified early and complications can be avoided or treated. This information is not intended to replace advice given to you by your health care provider. Make sure you discuss any questions you have with your health care provider. Document Released: 12/08/2010 Document Revised: 06/20/2017 Document Reviewed: 12/20/2015 Elsevier Patient Education  2020 [employer].

## 2018-12-06 LAB — RPR: RPR Ser Ql: NONREACTIVE

## 2018-12-07 ENCOUNTER — Encounter: Payer: Self-pay | Admitting: Family

## 2018-12-08 ENCOUNTER — Telehealth: Payer: Self-pay

## 2018-12-08 NOTE — Telephone Encounter (Signed)
Patient called inquiring about Dental impressions for partials. LPN advised patient that [facility] staff will reach out to Dental Coordinator with [facility] and someone from the dental clinic will give him a call when thay are back in the office on 12/12/18.  Juan Johnston

## 2018-12-12 ENCOUNTER — Encounter: Payer: Self-pay | Admitting: Family

## 2018-12-12 ENCOUNTER — Other Ambulatory Visit: Payer: Self-pay

## 2018-12-12 ENCOUNTER — Ambulatory Visit (INDEPENDENT_AMBULATORY_CARE_PROVIDER_SITE_OTHER): Payer: Medicare Other | Admitting: Family

## 2018-12-12 DIAGNOSIS — R21 Rash and other nonspecific skin eruption: Secondary | ICD-10-CM | POA: Diagnosis not present

## 2018-12-12 NOTE — Progress Notes (Signed)
Subjective:    Patient ID: Juan Johnston, male    DOB: Oct 31, 1950, 68 y.o.   MRN: [id number]  Chief Complaint  Patient presents with  . Follow-up    tingling in feet.     HPI:  Juan Johnston is a 68 y.o. male with HIV disease and diabetes presenting today for an acute office visit.   Juan Johnston has been experiencing a rash/breakout on the plantar surface of his bilateral feet and heels for about the last month without spread. No itchy or burning. Described as small blisters. 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 He has drained one of the areas with a clear fluid being returned. No systemic symptoms of fevers, chills or sweats. Most recent A1c of 6.2.    No Known Allergies    Outpatient Medications Prior to Visit  Medication Sig Dispense Refill  . aspirin 81 MG chewable tablet Chew 1 tablet (81 mg total) by mouth daily. 30 tablet 0  . atorvastatin (LIPITOR) 20 MG tablet Take 1 tablet (20 mg total) by mouth daily. 90 tablet 1  . bictegravir-emtricitabine-tenofovir AF (BIKTARVY) 50-200-25 MG TABS tablet Take 1 tablet by mouth daily. 30 tablet 5   No facility-administered medications prior to visit.      Past Medical History:  Diagnosis Date  . HIV infection ([place])   . Thyroid goiter      Past Surgical History:  Procedure Laterality Date  . INTRAVASCULAR ULTRASOUND/IVUS N/A 07/21/2017   Procedure: Intravascular Ultrasound/IVUS;  Surgeon: Waynetta Sandy, MD;  Location: [place] [facility];  Service: Cardiovascular;  Laterality: N/A;  . PERIPHERAL VASCULAR THROMBECTOMY N/A 07/20/2017   Procedure: PERIPHERAL VASCULAR THROMBECTOMY;  Surgeon: Serafina Mitchell, MD;  Location: [place] [facility];  Service: Cardiovascular;  Laterality: N/A;  . PERIPHERAL VASCULAR THROMBECTOMY N/A 07/21/2017   Procedure: PERIPHERAL VASCULAR THROMBECTOMY - LYSIS RECHECK;  Surgeon: Waynetta Sandy, MD;   Location: [place] [facility];  Service: Cardiovascular;  Laterality: N/A;  . scrotum cyst         Review of Systems  Constitutional: Negative for appetite change, chills, fatigue, fever and unexpected weight change.  Eyes: Negative for visual disturbance.  Respiratory: Negative for cough, chest tightness, shortness of breath and wheezing.   Cardiovascular: Negative for chest pain and leg swelling.  Gastrointestinal: Negative for abdominal pain, constipation, diarrhea, nausea and vomiting.  Genitourinary: Negative for dysuria, flank pain, frequency, genital sores, hematuria and urgency.  Skin: Positive for rash.  Allergic/Immunologic: Negative for immunocompromised state.  Neurological: Negative for dizziness and headaches.      Objective:    There were no vitals taken for this visit. Nursing note and vital signs reviewed.  Physical Exam Constitutional:      General: He is not in acute distress.    Appearance: He is well-developed.  Eyes:     Conjunctiva/sclera: Conjunctivae normal.  Neck:     Musculoskeletal: Neck supple.  Cardiovascular:     Rate and Rhythm: Normal rate and regular rhythm.     Heart sounds: Normal heart sounds. No murmur. No friction rub. No gallop.   Pulmonary:     Effort: Pulmonary effort is normal. No respiratory distress.     Breath sounds: Normal breath sounds. No wheezing or rales.  Chest:     Chest wall: No tenderness.  Abdominal:     General: Bowel sounds are normal.     Palpations: Abdomen is soft.  Tenderness: There is no abdominal tenderness.  Lymphadenopathy:     Cervical: No cervical adenopathy.  Skin:    General: Skin is warm and dry.     Findings: No rash.     Comments: Brownish blister and callus like formation located on the plantar aspect of bilateral feet in differing stages of healing with some firm and some soft. No bullous formation noted.   Neurological:     Mental Status: He is alert and oriented to person, place, and  time.  Psychiatric:        Behavior: Behavior normal.        Thought Content: Thought content normal.        Judgment: Judgment normal.      Depression screen [facility] 2/9 12/12/2018 11/30/2018 11/14/2018 08/08/2018 05/10/2018  Decreased Interest 0 0 0 0 0  Down, Depressed, Hopeless 1 0 0 0 0  PHQ - 2 Score 1 0 0 0 0  Altered sleeping - - - - 0  Tired, decreased energy - - - - 0  Change in appetite - - - - 0  Feeling bad or failure about yourself  - - - - 0  Trouble concentrating - - - - 0  Moving slowly or fidgety/restless - - - - 0  Suicidal thoughts - - - - 0  PHQ-9 Score - - - - 0       Assessment & Plan:    Patient Active Problem List   Diagnosis Date Noted  . Rash and nonspecific skin eruption 12/12/2018  . Hyperlipidemia associated with type 2 diabetes mellitus ([place]) 05/11/2018  . Leg DVT (deep venous thromboembolism), chronic, right ([place]) 07/20/2017  . Asymptomatic HIV infection ([place]) 07/19/2017  . GERD (gastroesophageal reflux disease) 07/19/2017  . Cough 07/15/2017  . Thyroid goiter 05/19/2017  . AIDS (acquired immune deficiency syndrome) ([place]) 04/21/2017  . Healthcare maintenance 04/21/2017  . Hyperlipidemia 04/21/2017     Problem List Items Addressed This Visit      Musculoskeletal and Integument   Rash and nonspecific skin eruption - Primary    Mr. Dommer has blister-like rash located on his bilateral heels and plantar aspects of his feet of unclear origin. Initially questioning a form of diabetic dermopathy or bullous diabeticorum however his blood sugars are well controlled. Given his diabetic status will request podiatry evaluation. Does not appear to be spreading or causing discomfort at present. Encouraged to monitor symptoms for improvement and avoid any further self-incision of sites.       Relevant Orders   Ambulatory referral to Podiatry       I am having Juan Johnston maintain his aspirin, atorvastatin, and Biktarvy.   Follow-up: Return if  symptoms worsen or fail to improve.   Terri Piedra, MSN, FNP-C Nurse Practitioner [facility] for Infectious Disease [place] number: [phone]

## 2018-12-12 NOTE — Patient Instructions (Signed)
Nice to see you.  We will get you referred to podiatry.   In the meantime - continue to monitor. Recommend no further puncturing of the sites.   Please let us know if you have any questions.

## 2018-12-12 NOTE — Assessment & Plan Note (Signed)
Juan Johnston has blister-like rash located on his bilateral heels and plantar aspects of his feet of unclear origin. Initially questioning a form of diabetic dermopathy or bullous diabeticorum however his blood sugars are well controlled. Given his diabetic status will request podiatry evaluation. Does not appear to be spreading or causing discomfort at present. Encouraged to monitor symptoms for improvement and avoid any further self-incision of sites.

## 2018-12-22 ENCOUNTER — Ambulatory Visit (INDEPENDENT_AMBULATORY_CARE_PROVIDER_SITE_OTHER): Payer: Medicare Other | Admitting: Podiatry

## 2018-12-22 ENCOUNTER — Other Ambulatory Visit: Payer: Self-pay | Admitting: Podiatry

## 2018-12-22 ENCOUNTER — Other Ambulatory Visit: Payer: Self-pay

## 2018-12-22 DIAGNOSIS — D229 Melanocytic nevi, unspecified: Secondary | ICD-10-CM

## 2018-12-22 DIAGNOSIS — B20 Human immunodeficiency virus [HIV] disease: Secondary | ICD-10-CM

## 2018-12-26 LAB — PATHOLOGY REPORT

## 2018-12-26 LAB — TISSUE SPECIMEN

## 2019-01-05 ENCOUNTER — Other Ambulatory Visit: Payer: Self-pay

## 2019-01-05 ENCOUNTER — Ambulatory Visit (INDEPENDENT_AMBULATORY_CARE_PROVIDER_SITE_OTHER): Payer: Medicare Other | Admitting: Podiatry

## 2019-01-05 DIAGNOSIS — L309 Dermatitis, unspecified: Secondary | ICD-10-CM

## 2019-01-05 MED ORDER — TRIAMCINOLONE ACETONIDE 0.1 % EX CREA: TOPICAL_CREAM | CUTANEOUS | 0 refills | Status: DC

## 2019-01-13 NOTE — Progress Notes (Signed)
  Subjective:  Patient ID: Juan Johnston, male    DOB: 12-22-1950,  MRN: [id number]  Chief Complaint  Patient presents with  . Foot Problem    Pt states bilateral plantar foot rash, 3 month duration, no known cause. Pt states itching and if squeezed clear fluid comes out.    68 y.o. male presents with the above complaint.  History as above  Review of Systems: Negative except as noted in the HPI. Denies N/V/F/Ch.  Past Medical History:  Diagnosis Date  . HIV infection ([place])   . Thyroid goiter     Current Outpatient Medications:  .  aspirin 81 MG chewable tablet, Chew 1 tablet (81 mg total) by mouth daily., Disp: 30 tablet, Rfl: 0 .  atorvastatin (LIPITOR) 20 MG tablet, Take 1 tablet (20 mg total) by mouth daily., Disp: 90 tablet, Rfl: 1 .  bictegravir-emtricitabine-tenofovir AF (BIKTARVY) 50-200-25 MG TABS tablet, Take 1 tablet by mouth daily., Disp: 30 tablet, Rfl: 5 .  triamcinolone cream (KENALOG) 0.1 %, Apply fingertip amount to affected areas of foot daily, Disp: 30 g, Rfl: 0  Social History   Tobacco Use  Smoking Status Current Some Day Smoker  . Types: Cigarettes  . Last attempt to quit: 02/03/2017  . Years since quitting: 1.9  Smokeless Tobacco Never Used  Tobacco Comment   1 pk lasts 2 days. "Does not inhale"    No Known Allergies Objective:  There were no vitals filed for this visit. There is no height or weight on file to calculate BMI. Constitutional Well developed. Well nourished.  Vascular Dorsalis pedis pulses palpable bilaterally. Posterior tibial pulses palpable bilaterally. Capillary refill normal to all digits.  No cyanosis or clubbing noted. Pedal hair growth normal.  Neurologic Normal speech. Oriented to person, place, and time. Epicritic sensation to light touch grossly present bilaterally.  Dermatologic Nails normal Skin bilateral plantar foot with patchy raised lesions with some areas of small purulent pustules  Orthopedic: Normal joint ROM  without pain or crepitus bilaterally. No visible deformities. No bony tenderness.   Radiographs: None Assessment:   1. Suspicious nevus    Plan:  Patient was evaluated and treated and all questions answered.  Suspicious lesion right medial arch -Given comorbidities will biopsy lesion -Educated on postop care -Follow-up in 2 weeks for recheck review of biopsy  Procedure: Punch Biopsy Indication: 42mm Anesthesia: lidocaine 1% with epi 3cc Instrumentation: 3 mm punch biopsy Dressing: Dry, sterile, compression dressing. Specimen: Labeled and sent for pathology. Disposition: Patient tolerated procedure well. Leave dressing on for 48 hours. Thereafter dress with antibiotic ointment and band-aid. Off-loading pads dispensed. Patient to return in 1 week for follow-up.      No follow-ups on file.

## 2019-02-03 NOTE — Progress Notes (Signed)
  Subjective:  Patient ID: Juan Johnston, male    DOB: 1950/06/23,  MRN: [id number]  Chief Complaint  Patient presents with  . Foot Problem    Suspicious nevus biopsy results    68 y.o. male presents with the above complaint.  States that he is here for results of the biopsy was performed at last visit.  States the biopsy feels fine without issue.  States that the rash is unchanged  Review of Systems: Negative except as noted in the HPI. Denies N/V/F/Ch.  Past Medical History:  Diagnosis Date  . HIV infection ([place])   . Thyroid goiter     Current Outpatient Medications:  .  aspirin 81 MG chewable tablet, Chew 1 tablet (81 mg total) by mouth daily., Disp: 30 tablet, Rfl: 0 .  atorvastatin (LIPITOR) 20 MG tablet, Take 1 tablet (20 mg total) by mouth daily., Disp: 90 tablet, Rfl: 1 .  bictegravir-emtricitabine-tenofovir AF (BIKTARVY) 50-200-25 MG TABS tablet, Take 1 tablet by mouth daily., Disp: 30 tablet, Rfl: 5 .  triamcinolone cream (KENALOG) 0.1 %, Apply fingertip amount to affected areas of foot daily, Disp: 30 g, Rfl: 0  Social History   Tobacco Use  Smoking Status Current Some Day Smoker  . Types: Cigarettes  . Last attempt to quit: 02/03/2017  . Years since quitting: 2.0  Smokeless Tobacco Never Used  Tobacco Comment   1 pk lasts 2 days. "Does not inhale"    No Known Allergies Objective:  There were no vitals filed for this visit. There is no height or weight on file to calculate BMI. Constitutional Well developed. Well nourished.  Vascular Dorsalis pedis pulses palpable bilaterally. Posterior tibial pulses palpable bilaterally. Capillary refill normal to all digits.  No cyanosis or clubbing noted. Pedal hair growth normal.  Neurologic Normal speech. Oriented to person, place, and time. Epicritic sensation to light touch grossly present bilaterally.  Dermatologic Nails normal Biopsy site healing well still with patchy raised lesions with areas of purulence   Orthopedic: Normal joint ROM without pain or crepitus bilaterally. No visible deformities. No bony tenderness.   Radiographs: None Pathology report suggestive of mild superficial perivascular dermatitis with eosinophils with stasis changes Assessment:   1. Dermatitis    Plan:  Patient was evaluated and treated and all questions answered.  Dermatitis right foot -Educated on etiology -Path reviewed with patient -Rx triamcinolone cream   Return in about 4 weeks (around 02/02/2019).

## 2019-02-08 ENCOUNTER — Other Ambulatory Visit: Payer: Self-pay

## 2019-02-08 ENCOUNTER — Ambulatory Visit (INDEPENDENT_AMBULATORY_CARE_PROVIDER_SITE_OTHER): Payer: Medicare Other | Admitting: Podiatry

## 2019-02-08 DIAGNOSIS — L309 Dermatitis, unspecified: Secondary | ICD-10-CM

## 2019-02-08 MED ORDER — TRIAMCINOLONE ACETONIDE 0.5 % EX OINT: TOPICAL_OINTMENT | CUTANEOUS | 0 refills | Status: DC

## 2019-02-08 NOTE — Progress Notes (Signed)
  Subjective:  Patient ID: Juan Johnston, male    DOB: November 23, 1950,  MRN: [id number]  Chief Complaint  Patient presents with  . Foot Problem    Pt states rash on right foot is improving "doing a little better". Pt states Triamcinolone cream has been effective.    68 y.o. male presents with the above complaint.  History as above confirmed with patient  Review of Systems: Negative except as noted in the HPI. Denies N/V/F/Ch.  Past Medical History:  Diagnosis Date  . HIV infection ([place])   . Thyroid goiter     Current Outpatient Medications:  .  aspirin 81 MG chewable tablet, Chew 1 tablet (81 mg total) by mouth daily., Disp: 30 tablet, Rfl: 0 .  atorvastatin (LIPITOR) 20 MG tablet, Take 1 tablet (20 mg total) by mouth daily., Disp: 90 tablet, Rfl: 1 .  bictegravir-emtricitabine-tenofovir AF (BIKTARVY) 50-200-25 MG TABS tablet, Take 1 tablet by mouth daily., Disp: 30 tablet, Rfl: 5 .  triamcinolone ointment (KENALOG) 0.5 %, Apply to affected areas once daily, Disp: 30 g, Rfl: 0  Social History   Tobacco Use  Smoking Status Current Some Day Smoker  . Types: Cigarettes  . Last attempt to quit: 02/03/2017  . Years since quitting: 2.0  Smokeless Tobacco Never Used  Tobacco Comment   1 pk lasts 2 days. "Does not inhale"    No Known Allergies Objective:  There were no vitals filed for this visit. There is no height or weight on file to calculate BMI. Constitutional Well developed. Well nourished.  Vascular Dorsalis pedis pulses palpable bilaterally. Posterior tibial pulses palpable bilaterally. Capillary refill normal to all digits.  No cyanosis or clubbing noted. Pedal hair growth normal.  Neurologic Normal speech. Oriented to person, place, and time. Epicritic sensation to light touch grossly present bilaterally.  Dermatologic Nails normal Raised lesions healing well without active areas of purulent pustules  Orthopedic: Normal joint ROM without pain or crepitus  bilaterally. No visible deformities. No bony tenderness.   Radiographs: None Pathology report suggestive of mild superficial perivascular dermatitis with eosinophils with stasis changes Assessment:   1. Dermatitis    Plan:  Patient was evaluated and treated and all questions answered.  Dermatitis right foot -Lesion improving refill triamcinolone patient requesting ointment instead of cream this is reasonable.  Refill granted -Courtesy nail debridement.  Patient advised that further nail debridement is not covered by patient's insurance   Return if symptoms worsen or fail to improve.

## 2019-03-12 ENCOUNTER — Other Ambulatory Visit: Payer: Medicare Other

## 2019-03-12 ENCOUNTER — Other Ambulatory Visit: Payer: Self-pay

## 2019-03-12 DIAGNOSIS — Z113 Encounter for screening for infections with a predominantly sexual mode of transmission: Secondary | ICD-10-CM

## 2019-03-12 DIAGNOSIS — Z21 Asymptomatic human immunodeficiency virus [HIV] infection status: Secondary | ICD-10-CM

## 2019-03-13 LAB — T-HELPER CELL (CD4) - (RCID CLINIC ONLY)
CD4 % Helper T Cell: 21 % — ABNORMAL LOW (ref 33–65)
CD4 T Cell Abs: 363 /uL — ABNORMAL LOW (ref 400–1790)

## 2019-03-19 LAB — COMPREHENSIVE METABOLIC PANEL
AG Ratio: 1.7 (calc) (ref 1.0–2.5)
ALT: 35 U/L (ref 9–46)
AST: 21 U/L (ref 10–35)
Albumin: 4.4 g/dL (ref 3.6–5.1)
Alkaline phosphatase (APISO): 63 U/L (ref 35–144)
BUN: 18 mg/dL (ref 7–25)
CO2: 28 mmol/L (ref 20–32)
Calcium: 9.8 mg/dL (ref 8.6–10.3)
Chloride: 104 mmol/L (ref 98–110)
Creat: 1.05 mg/dL (ref 0.70–1.25)
Globulin: 2.6 g/dL (calc) (ref 1.9–3.7)
Glucose, Bld: 94 mg/dL (ref 65–99)
Potassium: 4 mmol/L (ref 3.5–5.3)
Sodium: 139 mmol/L (ref 135–146)
Total Bilirubin: 0.6 mg/dL (ref 0.2–1.2)
Total Protein: 7 g/dL (ref 6.1–8.1)

## 2019-03-19 LAB — RPR: RPR Ser Ql: NONREACTIVE

## 2019-03-19 LAB — CBC
HCT: 46.3 % (ref 38.5–50.0)
Hemoglobin: 15.8 g/dL (ref 13.2–17.1)
MCH: 32.7 pg (ref 27.0–33.0)
MCHC: 34.1 g/dL (ref 32.0–36.0)
MCV: 95.9 fL (ref 80.0–100.0)
MPV: 11.1 fL (ref 7.5–12.5)
Platelets: 178 10*3/uL (ref 140–400)
RBC: 4.83 10*6/uL (ref 4.20–5.80)
RDW: 12.8 % (ref 11.0–15.0)
WBC: 5 10*3/uL (ref 3.8–10.8)

## 2019-03-19 LAB — HIV-1 RNA QUANT-NO REFLEX-BLD
HIV 1 RNA Quant: <20 copies/mL
HIV-1 RNA Quant, Log: <1.3 Log copies/mL

## 2019-03-23 ENCOUNTER — Telehealth: Payer: Self-pay

## 2019-03-23 NOTE — Telephone Encounter (Signed)
COVID-19 Pre-Screening Questions:03/23/19   Do you currently have a fever (>100 F), chills or unexplained body aches? NO  Are you currently experiencing new cough, shortness of breath, sore throat, runny nose? NO  .  Have you recently travelled outside the state of [place] in the last 14 days? NO  .  Have you been in contact with someone that is currently pending confirmation of Covid19 testing or has been confirmed to have the [place] virus?  NO  **If the patient answers NO to ALL questions -  advise the patient to please call the clinic before coming to the office should any symptoms develop.

## 2019-03-26 ENCOUNTER — Other Ambulatory Visit: Payer: Self-pay

## 2019-03-26 ENCOUNTER — Ambulatory Visit (INDEPENDENT_AMBULATORY_CARE_PROVIDER_SITE_OTHER): Payer: Medicare Other | Admitting: Family

## 2019-03-26 ENCOUNTER — Encounter: Payer: Self-pay | Admitting: Family

## 2019-03-26 VITALS — BP 110/69 | HR 73 | Wt 176.6 lb

## 2019-03-26 DIAGNOSIS — N521 Erectile dysfunction due to diseases classified elsewhere: Secondary | ICD-10-CM

## 2019-03-26 DIAGNOSIS — Z21 Asymptomatic human immunodeficiency virus [HIV] infection status: Secondary | ICD-10-CM

## 2019-03-26 DIAGNOSIS — Z0000 Encounter for general adult medical examination without abnormal findings: Secondary | ICD-10-CM | POA: Diagnosis not present

## 2019-03-26 MED ORDER — BIKTARVY 50-200-25 MG PO TABS: 1.0000 | ORAL_TABLET | Freq: Every day | ORAL | 5 refills | Status: DC

## 2019-03-26 NOTE — Assessment & Plan Note (Signed)
   All vaccinations are up-to-date per recommendations  Discussed importance of safe sexual practice to reduce risk of STI.  Condoms declined.  Routine dental screening up-to-date per recommendations.

## 2019-03-26 NOTE — Assessment & Plan Note (Signed)
Juan Johnston continues to have well-controlled HIV disease with good adherence and tolerance to his ART regimen of Biktarvy.  No signs/symptoms of opportunistic infection or progressive HIV disease.  We reviewed his most recent lab work and discussed the plan of care.  Continue current dose of Biktarvy.  Plan for follow-up in 4 months or sooner if needed with lab work 1 to 2 weeks prior to appointment.

## 2019-03-26 NOTE — Progress Notes (Signed)
Subjective:    Patient ID: Juan Johnston, male    DOB: 08/31/1950, 68 y.o.   MRN: [id number]  Chief Complaint  Patient presents with  . Follow-up    no complaints; declined condoms; received flu shot this year;     HPI:  Juan Johnston is a 68 y.o. male with HIV disease who was last seen in the office on 12/12/2018 with good adherence and tolerance to his ART regimen of Biktarvy.  Blood work at the time showed a viral load that was undetectable and CD4 count of 287.  Most recent blood work completed on 03/12/2019 with viral load that remains undetectable and CD4 count of 363.  Kidney function, liver function, and electrolytes within normal ranges.  RPR nonreactive for syphilis.  All vaccinations and cancer screenings are up-to-date per recommendations.  Juan Johnston and continues to take his Biktarvy as prescribed with no adverse side effects or missed doses.  Overall feeling well today with no new concerns/complaints.  He does continue to have erectile dysfunction at times it has been refractory to Viagra with occasional improvements with Cialis.  He has questions regarding his testosterone levels. Denies fevers, chills, night sweats, headaches, changes in vision, neck pain/stiffness, nausea, diarrhea, vomiting, lesions or rashes.  [name] remains covered through Medicare and has no problems obtaining his medication from the pharmacy.  Denies feelings of being down, depressed, or hopeless recently.  No recreational or illicit drug use, tobacco use, or alcohol consumption recently.  Most recent dental cleaning is up-to-date per recommendations.  Declines condoms today.   No Known Allergies    Outpatient Medications Prior to Visit  Medication Sig Dispense Refill  . aspirin 81 MG chewable tablet Chew 1 tablet (81 mg total) by mouth daily. 30 tablet 0  . atorvastatin (LIPITOR) 20 MG tablet Take 1 tablet (20 mg total) by mouth daily. 90 tablet 1  . bictegravir-emtricitabine-tenofovir  AF (BIKTARVY) 50-200-25 MG TABS tablet Take 1 tablet by mouth daily. 30 tablet 5  . triamcinolone ointment (KENALOG) 0.5 % Apply to affected areas once daily (Patient not taking: Reported on 03/26/2019) 30 g 0   No facility-administered medications prior to visit.     Past Medical History:  Diagnosis Date  . HIV infection ([place])   . Thyroid goiter      Past Surgical History:  Procedure Laterality Date  . INTRAVASCULAR ULTRASOUND/IVUS N/A 07/21/2017   Procedure: Intravascular Ultrasound/IVUS;  Surgeon: Waynetta Sandy, MD;  Location: [facility];  Service: Cardiovascular;  Laterality: N/A;  . PERIPHERAL VASCULAR THROMBECTOMY N/A 07/20/2017   Procedure: PERIPHERAL VASCULAR THROMBECTOMY;  Surgeon: Serafina Mitchell, MD;  Location: [facility];  Service: Cardiovascular;  Laterality: N/A;  . PERIPHERAL VASCULAR THROMBECTOMY N/A 07/21/2017   Procedure: PERIPHERAL VASCULAR THROMBECTOMY - LYSIS RECHECK;  Surgeon: Waynetta Sandy, MD;  Location: [facility];  Service: Cardiovascular;  Laterality: N/A;  . scrotum cyst       Review of Systems  Constitutional: Negative for appetite change, chills, fatigue, fever and unexpected weight change.  Eyes: Negative for visual disturbance.  Respiratory: Negative for cough, chest tightness, shortness of breath and wheezing.   Cardiovascular: Negative for chest pain and leg swelling.  Gastrointestinal: Negative for abdominal pain, constipation, diarrhea, nausea and vomiting.  Genitourinary: Negative for dysuria, flank pain, frequency, genital sores, hematuria and urgency.  Skin: Negative for rash.  Allergic/Immunologic: Negative for immunocompromised state.  Neurological: Negative for dizziness and headaches.  Objective:    BP 110/69   Pulse 73   Wt 176 lb 9.6 oz (80.1 kg)   BMI 27.66 kg/m  Nursing note and vital signs reviewed.  Physical Exam Constitutional:      General: He is not in acute  distress.    Appearance: He is well-developed.  Eyes:     Conjunctiva/sclera: Conjunctivae normal.  Cardiovascular:     Rate and Rhythm: Normal rate and regular rhythm.     Heart sounds: Normal heart sounds. No murmur. No friction rub. No gallop.   Pulmonary:     Effort: Pulmonary effort is normal. No respiratory distress.     Breath sounds: Normal breath sounds. No wheezing or rales.  Chest:     Chest wall: No tenderness.  Abdominal:     General: Bowel sounds are normal.     Palpations: Abdomen is soft.     Tenderness: There is no abdominal tenderness.  Musculoskeletal:     Cervical back: Neck supple.  Lymphadenopathy:     Cervical: No cervical adenopathy.  Skin:    General: Skin is warm and dry.     Findings: No rash.  Neurological:     Mental Status: He is alert and oriented to person, place, and time.  Psychiatric:        Behavior: Behavior normal.        Thought Content: Thought content normal.        Judgment: Judgment normal.      Depression screen [facility] 2/9 03/26/2019 12/12/2018 11/30/2018 11/14/2018 08/08/2018  Decreased Interest 0 0 0 0 0  Down, Depressed, Hopeless 0 1 0 0 0  PHQ - 2 Score 0 1 0 0 0  Altered sleeping - - - - -  Tired, decreased energy - - - - -  Change in appetite - - - - -  Feeling bad or failure about yourself  - - - - -  Trouble concentrating - - - - -  Moving slowly or fidgety/restless - - - - -  Suicidal thoughts - - - - -  PHQ-9 Score - - - - -       Assessment & Plan:    Patient Active Problem List   Diagnosis Date Noted  . Erectile dysfunction due to diseases classified elsewhere 03/26/2019  . Rash and nonspecific skin eruption 12/12/2018  . Hyperlipidemia associated with type 2 diabetes mellitus ([place]) 05/11/2018  . Leg DVT (deep venous thromboembolism), chronic, right ([place]) 07/20/2017  . Asymptomatic HIV infection ([place]) 07/19/2017  . GERD (gastroesophageal reflux disease) 07/19/2017  . Cough 07/15/2017  . Thyroid goiter 05/19/2017   . AIDS (acquired immune deficiency syndrome) ([place]) 04/21/2017  . Healthcare maintenance 04/21/2017  . Hyperlipidemia 04/21/2017     Problem List Items Addressed This Visit      Other   Healthcare maintenance     All vaccinations are up-to-date per recommendations  Discussed importance of safe sexual practice to reduce risk of STI.  Condoms declined.  Routine dental screening up-to-date per recommendations.      Asymptomatic HIV infection ([place]) - Primary    [name] continues to have well-controlled HIV disease with good adherence and tolerance to his ART regimen of Biktarvy.  No signs/symptoms of opportunistic infection or progressive HIV disease.  We reviewed his most recent lab work and discussed the plan of care.  Continue current dose of Biktarvy.  Plan for follow-up in 4 months or sooner if needed with lab work 1 to 2  weeks prior to appointment.      Relevant Medications   bictegravir-emtricitabine-tenofovir AF (BIKTARVY) 50-200-25 MG TABS tablet   Other Relevant Orders   3 month VL   3 month T-helper cell (CD4)- ([facility] clinic only)   3 month CMP   Testosterone   Erectile dysfunction due to diseases classified elsewhere    Mr. Steeb appears to have erectile dysfunction most likely multifactorial that has been refractory to Viagra and occasionally susceptible to Cialis.  He has concerns regarding his testosterone levels.  We will plan to check testosterone before 10 AM with next blood work.  Discussed importance of maintaining his blood sugars and control as well as optimizing health through nutrition.  May require referral to urology.          I have discontinued Famous Lopezmartinez's triamcinolone ointment. I am also having him maintain his aspirin, atorvastatin, and Biktarvy.   Meds ordered this encounter  Medications  . bictegravir-emtricitabine-tenofovir AF (BIKTARVY) 50-200-25 MG TABS tablet    Sig: Take 1 tablet by mouth daily.    Dispense:  30 tablet     Refill:  5    Order Specific Question:   Supervising Provider    Answer:   Carlyle Basques [[id number]]     Follow-up: Return in about 4 months (around 07/25/2019), or if symptoms worsen or fail to improve.   Terri Piedra, MSN, FNP-C Nurse Practitioner [facility] [place] number: [phone]

## 2019-03-26 NOTE — Patient Instructions (Addendum)
Nice to see you.  Please a schedule a lab appointment for the next blood work between 9am-10am so that we can recheck your testosterone.  Continue to take you Biktarvy as prescribed.  Refills have been sent to the pharmacy.   Plan for follow up in 4 months or sooner if needed with lab work 1-2 weeks prior to your appointment between 9-10am.   Have a great day and stay safe!  Happy Holidays!

## 2019-03-26 NOTE — Assessment & Plan Note (Signed)
Mr. Morgenroth appears to have erectile dysfunction most likely multifactorial that has been refractory to Viagra and occasionally susceptible to Cialis.  He has concerns regarding his testosterone levels.  We will plan to check testosterone before 10 AM with next blood work.  Discussed importance of maintaining his blood sugars and control as well as optimizing health through nutrition.  May require referral to urology.

## 2019-04-11 ENCOUNTER — Ambulatory Visit: Payer: Medicare Other

## 2019-04-11 ENCOUNTER — Other Ambulatory Visit: Payer: Self-pay

## 2019-05-26 ENCOUNTER — Other Ambulatory Visit (INDEPENDENT_AMBULATORY_CARE_PROVIDER_SITE_OTHER): Payer: Self-pay | Admitting: Primary Care

## 2019-05-31 ENCOUNTER — Encounter: Payer: Self-pay | Admitting: Family

## 2019-06-05 ENCOUNTER — Ambulatory Visit (INDEPENDENT_AMBULATORY_CARE_PROVIDER_SITE_OTHER): Payer: Medicare Other | Admitting: Primary Care

## 2019-06-05 ENCOUNTER — Encounter (INDEPENDENT_AMBULATORY_CARE_PROVIDER_SITE_OTHER): Payer: Self-pay | Admitting: Primary Care

## 2019-06-05 ENCOUNTER — Other Ambulatory Visit: Payer: Self-pay

## 2019-06-05 DIAGNOSIS — E1169 Type 2 diabetes mellitus with other specified complication: Secondary | ICD-10-CM | POA: Diagnosis not present

## 2019-06-05 DIAGNOSIS — E785 Hyperlipidemia, unspecified: Secondary | ICD-10-CM

## 2019-06-05 DIAGNOSIS — R7303 Prediabetes: Secondary | ICD-10-CM

## 2019-06-05 NOTE — Progress Notes (Signed)
Virtual Visit via Telephone Note  I connected with Juan Johnston on 06/05/19 at  2:10 PM EST by telephone and verified that I am speaking with the correct person using two identifiers.   I discussed the limitations, risks, security and privacy concerns of performing an evaluation and management service by telephone and the availability of in person appointments. I also discussed with the patient that there may be a patient responsible charge related to this service. The patient expressed understanding and agreed to proceed.   History of Present Illness: Juan Johnston is having a tele visit for medication refills for his cholesterol medication -atorvastatin 20mg  . He states hes only been out of medication for a few weeks. Informed patient the last labs were 07/20/2018 and was unable to refill until fasting lipids was done. Patient understands and will be in this week for lipid panel. He is closely followed by infections disease.   Past Medical History:  Diagnosis Date  . HIV infection ([place])   . Thyroid goiter    Current Outpatient Medications on File Prior to Visit  Medication Sig Dispense Refill  . aspirin 81 MG chewable tablet Chew 1 tablet (81 mg total) by mouth daily. 30 tablet 0  . atorvastatin (LIPITOR) 20 MG tablet Take 1 tablet (20 mg total) by mouth daily. 90 tablet 1  . bictegravir-emtricitabine-tenofovir AF (BIKTARVY) 50-200-25 MG TABS tablet Take 1 tablet by mouth daily. 30 tablet 5   No current facility-administered medications on file prior to visit.   Observations/Objective: Review of Systems  All other systems reviewed and are negative.  Assessment and Plan: Juan Johnston was seen today for medication refill.  Diagnoses and all orders for this visit:  Hyperlipidemia associated with type 2 diabetes mellitus ([place]) After blood work is completed at that time will determine is cholesterol medication is needed and dosage.  Prediabetes Last A1C 11/09/2017 will also check to  evaluate status of diabetes. Not controlled by medication but diet and exercise .  Follow Up Instructions:    I discussed the assessment and treatment plan with the patient. The patient was provided an opportunity to ask questions and all were answered. The patient agreed with the plan and demonstrated an understanding of the instructions.   The patient was advised to call back or seek an in-person evaluation if the symptoms worsen or if the condition fails to improve as anticipated.  I provided 10 minutes of non-face-to-face  time during this encounter. This includes reviewing previous encounters, labs medications no resent imaging   Kerin Perna, NP

## 2019-06-05 NOTE — Progress Notes (Signed)
Needs order for lipids

## 2019-06-06 ENCOUNTER — Other Ambulatory Visit (INDEPENDENT_AMBULATORY_CARE_PROVIDER_SITE_OTHER): Payer: Medicare Other

## 2019-06-06 DIAGNOSIS — R7303 Prediabetes: Secondary | ICD-10-CM

## 2019-06-06 DIAGNOSIS — E1169 Type 2 diabetes mellitus with other specified complication: Secondary | ICD-10-CM

## 2019-06-07 LAB — LIPID PANEL
Chol/HDL Ratio: 4.4 ratio (ref 0.0–5.0)
Cholesterol, Total: 212 mg/dL — ABNORMAL HIGH (ref 100–199)
HDL: 48 mg/dL (ref >39)
LDL Chol Calc (NIH): 129 mg/dL — ABNORMAL HIGH (ref 0–99)
Triglycerides: 197 mg/dL — ABNORMAL HIGH (ref 0–149)
VLDL Cholesterol Cal: 35 mg/dL (ref 5–40)

## 2019-06-07 LAB — HEMOGLOBIN A1C
Est. average glucose Bld gHb Est-mCnc: 140 mg/dL
Hgb A1c MFr Bld: 6.5 % — ABNORMAL HIGH (ref 4.8–5.6)

## 2019-06-12 ENCOUNTER — Other Ambulatory Visit (INDEPENDENT_AMBULATORY_CARE_PROVIDER_SITE_OTHER): Payer: Self-pay | Admitting: Primary Care

## 2019-06-12 MED ORDER — ATORVASTATIN CALCIUM 40 MG PO TABS: 40.0000 mg | ORAL_TABLET | Freq: Every day | ORAL | 1 refills | Status: DC

## 2019-07-10 ENCOUNTER — Other Ambulatory Visit: Payer: Medicare Other

## 2019-07-10 ENCOUNTER — Other Ambulatory Visit: Payer: Self-pay

## 2019-07-10 DIAGNOSIS — Z21 Asymptomatic human immunodeficiency virus [HIV] infection status: Secondary | ICD-10-CM

## 2019-07-11 LAB — T-HELPER CELL (CD4) - (RCID CLINIC ONLY)
CD4 % Helper T Cell: 22 % — ABNORMAL LOW (ref 33–65)
CD4 T Cell Abs: 452 /uL (ref 400–1790)

## 2019-07-12 LAB — COMPREHENSIVE METABOLIC PANEL
AG Ratio: 1.7 (calc) (ref 1.0–2.5)
ALT: 25 U/L (ref 9–46)
AST: 21 U/L (ref 10–35)
Albumin: 4 g/dL (ref 3.6–5.1)
Alkaline phosphatase (APISO): 66 U/L (ref 35–144)
BUN: 16 mg/dL (ref 7–25)
CO2: 28 mmol/L (ref 20–32)
Calcium: 9.2 mg/dL (ref 8.6–10.3)
Chloride: 106 mmol/L (ref 98–110)
Creat: 0.96 mg/dL (ref 0.70–1.25)
Globulin: 2.3 g/dL (calc) (ref 1.9–3.7)
Glucose, Bld: 144 mg/dL — ABNORMAL HIGH (ref 65–99)
Potassium: 4 mmol/L (ref 3.5–5.3)
Sodium: 139 mmol/L (ref 135–146)
Total Bilirubin: 0.4 mg/dL (ref 0.2–1.2)
Total Protein: 6.3 g/dL (ref 6.1–8.1)

## 2019-07-12 LAB — HIV-1 RNA QUANT-NO REFLEX-BLD
HIV 1 RNA Quant: 62 copies/mL — ABNORMAL HIGH
HIV-1 RNA Quant, Log: 1.79 Log copies/mL — ABNORMAL HIGH

## 2019-07-25 ENCOUNTER — Ambulatory Visit (INDEPENDENT_AMBULATORY_CARE_PROVIDER_SITE_OTHER): Payer: Medicare Other | Admitting: Family

## 2019-07-25 ENCOUNTER — Encounter: Payer: Self-pay | Admitting: Family

## 2019-07-25 ENCOUNTER — Other Ambulatory Visit: Payer: Self-pay

## 2019-07-25 VITALS — BP 131/80 | HR 73 | Ht 67.0 in | Wt 179.6 lb

## 2019-07-25 DIAGNOSIS — B20 Human immunodeficiency virus [HIV] disease: Secondary | ICD-10-CM | POA: Diagnosis not present

## 2019-07-25 DIAGNOSIS — Z113 Encounter for screening for infections with a predominantly sexual mode of transmission: Secondary | ICD-10-CM | POA: Diagnosis not present

## 2019-07-25 DIAGNOSIS — Z0000 Encounter for general adult medical examination without abnormal findings: Secondary | ICD-10-CM | POA: Diagnosis not present

## 2019-07-25 MED ORDER — BIKTARVY 50-200-25 MG PO TABS: 1.0000 | ORAL_TABLET | Freq: Every day | ORAL | 5 refills | Status: DC

## 2019-07-25 NOTE — Patient Instructions (Addendum)
Nice to see you.  We will continue your current dose of Biktarvy.   Refills have been sent to the pharmacy.  Plan for follow up 4 months or sooner if needed with lab work 1-2 weeks prior to appointment.   Have a great day and stay safe!  Enjoy the elk meat!

## 2019-07-25 NOTE — Assessment & Plan Note (Signed)
Juan Johnston continues to have well controlled HIV disease with good adherence and tolerance to his ART regimen of Biktarvy.  No signs/symptoms of opportunistic infection or progressive HIV disease.  We reviewed his lab work and discussed the plan of care.  Continue current dose of Biktarvy.  Plan for follow-up in 4 months or sooner if needed with lab work 1 to 2 weeks prior to appointment.

## 2019-07-25 NOTE — Assessment & Plan Note (Signed)
   Covid vaccinations updated and completed series  Discussed importance of safe sexual practice to reduce risk of STI.  Condoms declined.

## 2019-07-25 NOTE — Progress Notes (Signed)
Subjective:    Patient ID: Juan Johnston, male    DOB: 1950-12-22, 69 y.o.   MRN: [id number]  Chief Complaint  Patient presents with  . Follow-up    4 follow up, having left leg pain     HPI:  Ranny Hertenstein is a 69 y.o. male with HIV disease who was last seen in the office on 03/26/19 with good adherence and tolerance to his ART regimen of Biktarvy. Viral load at the time was undetectable with CD4 count of 363. Most recent blood work completed on 07/10/19 with viral load of 62 (undetectable) and CD4 count of 452. He has received his Moderna vaccinations for Covid.  Mr. Chargualaf continues to take his Biktarvy as prescribed with no adverse side effects or missed doses. Overall feeling well today. Been having left leg pain that he is having evaluated. Denies fevers, chills, night sweats, headaches, changes in vision, neck pain/stiffness, nausea, diarrhea, vomiting, lesions or rashes.  Mr. Schad continues to have coverage through Medicare and has no problems obtaining medications from the pharmacy. Denies feelings of being down, depressed or hopeless. No recreational or illicit drug use, tobacco use, or alcohol consumption. Not currently sexually active and declines condoms.     No Known Allergies    Outpatient Medications Prior to Visit  Medication Sig Dispense Refill  . aspirin 81 MG chewable tablet Chew 1 tablet (81 mg total) by mouth daily. 30 tablet 0  . atorvastatin (LIPITOR) 40 MG tablet Take 1 tablet (40 mg total) by mouth daily. 90 tablet 1  . bictegravir-emtricitabine-tenofovir AF (BIKTARVY) 50-200-25 MG TABS tablet Take 1 tablet by mouth daily. 30 tablet 5   No facility-administered medications prior to visit.     Past Medical History:  Diagnosis Date  . HIV infection ([place])   . Thyroid goiter      Past Surgical History:  Procedure Laterality Date  . INTRAVASCULAR ULTRASOUND/IVUS N/A 07/21/2017   Procedure: Intravascular Ultrasound/IVUS;  Surgeon: Waynetta Sandy, MD;  Location: [place] [facility];  Service: Cardiovascular;  Laterality: N/A;  . PERIPHERAL VASCULAR THROMBECTOMY N/A 07/20/2017   Procedure: PERIPHERAL VASCULAR THROMBECTOMY;  Surgeon: Serafina Mitchell, MD;  Location: [place] [facility];  Service: Cardiovascular;  Laterality: N/A;  . PERIPHERAL VASCULAR THROMBECTOMY N/A 07/21/2017   Procedure: PERIPHERAL VASCULAR THROMBECTOMY - LYSIS RECHECK;  Surgeon: Waynetta Sandy, MD;  Location: [place] [facility];  Service: Cardiovascular;  Laterality: N/A;  . scrotum cyst       Review of Systems  Constitutional: Negative for appetite change, chills, fatigue, fever and unexpected weight change.  Eyes: Negative for visual disturbance.  Respiratory: Negative for cough, chest tightness, shortness of breath and wheezing.   Cardiovascular: Negative for chest pain and leg swelling.  Gastrointestinal: Negative for abdominal pain, constipation, diarrhea, nausea and vomiting.  Genitourinary: Negative for dysuria, flank pain, frequency, genital sores, hematuria and urgency.  Skin: Negative for rash.  Allergic/Immunologic: Negative for immunocompromised state.  Neurological: Negative for dizziness and headaches.      Objective:    BP 131/80 (BP Location: Left Arm)   Pulse 73   Ht 5\' 7"  (1.702 m)   Wt 179 lb 9.6 oz (81.5 kg)   SpO2 99%   BMI 28.13 kg/m  Nursing note and vital signs reviewed.  Physical Exam Constitutional:      General: He is not in acute distress.    Appearance: He is well-developed.  Eyes:     Conjunctiva/sclera: Conjunctivae normal.  Cardiovascular:  Rate and Rhythm: Normal rate and regular rhythm.     Heart sounds: Normal heart sounds. No murmur. No friction rub. No gallop.   Pulmonary:     Effort: Pulmonary effort is normal. No respiratory distress.     Breath sounds: Normal breath sounds. No wheezing or rales.  Chest:     Chest wall: No tenderness.  Abdominal:     General: Bowel sounds  are normal.     Palpations: Abdomen is soft.     Tenderness: There is no abdominal tenderness.  Musculoskeletal:     Cervical back: Neck supple.  Lymphadenopathy:     Cervical: No cervical adenopathy.  Skin:    General: Skin is warm and dry.     Findings: No rash.  Neurological:     Mental Status: He is alert and oriented to person, place, and time.  Psychiatric:        Behavior: Behavior normal.        Thought Content: Thought content normal.        Judgment: Judgment normal.      Depression screen [facility] 2/9 06/05/2019 03/26/2019 12/12/2018 11/30/2018 11/14/2018  Decreased Interest 0 0 0 0 0  Down, Depressed, Hopeless 0 0 1 0 0  PHQ - 2 Score 0 0 1 0 0  Altered sleeping - - - - -  Tired, decreased energy - - - - -  Change in appetite - - - - -  Feeling bad or failure about yourself  - - - - -  Trouble concentrating - - - - -  Moving slowly or fidgety/restless - - - - -  Suicidal thoughts - - - - -  PHQ-9 Score - - - - -       Assessment & Plan:    Patient Active Problem List   Diagnosis Date Noted  . Erectile dysfunction due to diseases classified elsewhere 03/26/2019  . Rash and nonspecific skin eruption 12/12/2018  . Hyperlipidemia associated with type 2 diabetes mellitus ([place]) 05/11/2018  . Leg DVT (deep venous thromboembolism), chronic, right ([place]) 07/20/2017  . Asymptomatic HIV infection ([place]) 07/19/2017  . GERD (gastroesophageal reflux disease) 07/19/2017  . Cough 07/15/2017  . Thyroid goiter 05/19/2017  . HIV disease ([place]) 04/21/2017  . Healthcare maintenance 04/21/2017  . Hyperlipidemia 04/21/2017     Problem List Items Addressed This Visit      Other   HIV disease [facility])    Mr. Genovese continues to have well controlled HIV disease with good adherence and tolerance to his ART regimen of Biktarvy.  No signs/symptoms of opportunistic infection or progressive HIV disease.  We reviewed his lab work and discussed the plan of care.  Continue current dose of  Biktarvy.  Plan for follow-up in 4 months or sooner if needed with lab work 1 to 2 weeks prior to appointment.      Relevant Medications   bictegravir-emtricitabine-tenofovir AF (BIKTARVY) 50-200-25 MG TABS tablet   Healthcare maintenance     Covid vaccinations updated and completed series  Discussed importance of safe sexual practice to reduce risk of STI.  Condoms declined.      Asymptomatic HIV infection ([place])   Relevant Medications   bictegravir-emtricitabine-tenofovir AF (BIKTARVY) 50-200-25 MG TABS tablet       I am having Stevie Kern maintain his aspirin, atorvastatin, and Biktarvy.   Meds ordered this encounter  Medications  . bictegravir-emtricitabine-tenofovir AF (BIKTARVY) 50-200-25 MG TABS tablet    Sig: Take 1 tablet by mouth daily.  Dispense:  30 tablet    Refill:  5    Order Specific Question:   Supervising Provider    Answer:   Carlyle Basques [[id number]]     Follow-up: Return in about 4 weeks (around 08/22/2019), or if symptoms worsen or fail to improve.   Terri Piedra, MSN, FNP-C Nurse Practitioner [facility] for Infectious Disease [place] number: [phone]

## 2019-10-09 ENCOUNTER — Other Ambulatory Visit: Payer: Self-pay

## 2019-10-09 ENCOUNTER — Ambulatory Visit: Payer: Medicare (Managed Care)

## 2019-10-10 ENCOUNTER — Encounter: Payer: Self-pay | Admitting: Infectious Diseases

## 2019-11-01 ENCOUNTER — Other Ambulatory Visit: Payer: Self-pay

## 2019-11-01 ENCOUNTER — Other Ambulatory Visit: Payer: Medicare (Managed Care)

## 2019-11-01 DIAGNOSIS — B20 Human immunodeficiency virus [HIV] disease: Secondary | ICD-10-CM

## 2019-11-01 DIAGNOSIS — Z113 Encounter for screening for infections with a predominantly sexual mode of transmission: Secondary | ICD-10-CM

## 2019-11-02 LAB — T-HELPER CELL (CD4) - (RCID CLINIC ONLY)
CD4 % Helper T Cell: 25 % — ABNORMAL LOW (ref 33–65)
CD4 T Cell Abs: 437 /uL (ref 400–1790)

## 2019-11-05 LAB — COMPREHENSIVE METABOLIC PANEL
AG Ratio: 1.8 (calc) (ref 1.0–2.5)
ALT: 38 U/L (ref 9–46)
AST: 18 U/L (ref 10–35)
Albumin: 4.2 g/dL (ref 3.6–5.1)
Alkaline phosphatase (APISO): 74 U/L (ref 35–144)
BUN: 13 mg/dL (ref 7–25)
CO2: 26 mmol/L (ref 20–32)
Calcium: 9.5 mg/dL (ref 8.6–10.3)
Chloride: 107 mmol/L (ref 98–110)
Creat: 1.02 mg/dL (ref 0.70–1.25)
Globulin: 2.4 g/dL (calc) (ref 1.9–3.7)
Glucose, Bld: 105 mg/dL — ABNORMAL HIGH (ref 65–99)
Potassium: 4.1 mmol/L (ref 3.5–5.3)
Sodium: 140 mmol/L (ref 135–146)
Total Bilirubin: 0.7 mg/dL (ref 0.2–1.2)
Total Protein: 6.6 g/dL (ref 6.1–8.1)

## 2019-11-05 LAB — CBC
HCT: 45.3 % (ref 38.5–50.0)
Hemoglobin: 15.4 g/dL (ref 13.2–17.1)
MCH: 32.8 pg (ref 27.0–33.0)
MCHC: 34 g/dL (ref 32.0–36.0)
MCV: 96.6 fL (ref 80.0–100.0)
MPV: 11.7 fL (ref 7.5–12.5)
Platelets: 169 10*3/uL (ref 140–400)
RBC: 4.69 10*6/uL (ref 4.20–5.80)
RDW: 12.8 % (ref 11.0–15.0)
WBC: 6.5 10*3/uL (ref 3.8–10.8)

## 2019-11-05 LAB — HIV-1 RNA QUANT-NO REFLEX-BLD
HIV 1 RNA Quant: <20 copies/mL
HIV-1 RNA Quant, Log: <1.3 Log copies/mL

## 2019-11-05 LAB — RPR: RPR Ser Ql: NONREACTIVE

## 2019-11-13 ENCOUNTER — Telehealth: Payer: Self-pay

## 2019-11-13 NOTE — Telephone Encounter (Signed)
[name] came in today at around 3:10 p.m. on 11/13/2019. He came in requesting a tb skin test, he was informed that we don't perform that procedure here to which he understood. He was then provided with the address and phone number to the [facility] and per his request his Primary Care Provider's address and phone number. Afterwards he then called his provider and thus we concluded his walk in appointment. Juan Johnston

## 2019-11-15 ENCOUNTER — Encounter: Payer: Medicare (Managed Care) | Admitting: Family

## 2019-11-19 ENCOUNTER — Other Ambulatory Visit: Payer: Self-pay

## 2019-11-19 ENCOUNTER — Ambulatory Visit (INDEPENDENT_AMBULATORY_CARE_PROVIDER_SITE_OTHER): Payer: Medicare (Managed Care) | Admitting: Primary Care

## 2019-11-19 ENCOUNTER — Encounter: Payer: Self-pay | Admitting: Family

## 2019-11-19 ENCOUNTER — Ambulatory Visit (INDEPENDENT_AMBULATORY_CARE_PROVIDER_SITE_OTHER): Payer: Medicare (Managed Care) | Admitting: Family

## 2019-11-19 ENCOUNTER — Encounter (INDEPENDENT_AMBULATORY_CARE_PROVIDER_SITE_OTHER): Payer: Self-pay | Admitting: Primary Care

## 2019-11-19 VITALS — BP 120/73 | HR 70 | Wt 172.0 lb

## 2019-11-19 VITALS — BP 126/80 | HR 78 | Temp 98.1°F | Ht 67.0 in | Wt 172.0 lb

## 2019-11-19 DIAGNOSIS — R7303 Prediabetes: Secondary | ICD-10-CM | POA: Diagnosis not present

## 2019-11-19 DIAGNOSIS — B20 Human immunodeficiency virus [HIV] disease: Secondary | ICD-10-CM | POA: Diagnosis not present

## 2019-11-19 DIAGNOSIS — E782 Mixed hyperlipidemia: Secondary | ICD-10-CM

## 2019-11-19 DIAGNOSIS — Z021 Encounter for pre-employment examination: Secondary | ICD-10-CM | POA: Diagnosis not present

## 2019-11-19 DIAGNOSIS — Z0000 Encounter for general adult medical examination without abnormal findings: Secondary | ICD-10-CM

## 2019-11-19 LAB — POCT GLYCOSYLATED HEMOGLOBIN (HGB A1C): Hemoglobin A1C: 6.1 % — AB (ref 4.0–5.6)

## 2019-11-19 MED ORDER — BIKTARVY 50-200-25 MG PO TABS: 1.0000 | ORAL_TABLET | Freq: Every day | ORAL | 5 refills | Status: DC

## 2019-11-19 NOTE — Progress Notes (Signed)
Established Patient Office Visit  Subjective:  Patient ID: Juan Johnston, male    DOB: 22-Nov-1950  Age: 68 y.o. MRN: [id number]  CC:  Chief Complaint  Patient presents with  . Prediabetes  . PPD Placement    HPI [name] is a 69 year old male presents today for PPD placement.  Since he had not been seen since March screened for prediabetes and blood pressure is elevated. Denies shortness of breath, headaches, chest pain or lower extremity edema.  Denies any signs and symptoms of hypo or hyperglycemia  Past Medical History:  Diagnosis Date  . HIV infection ([place])   . Thyroid goiter     Past Surgical History:  Procedure Laterality Date  . INTRAVASCULAR ULTRASOUND/IVUS N/A 07/21/2017   Procedure: Intravascular Ultrasound/IVUS;  Surgeon: Waynetta Sandy, MD;  Location: [place] [facility];  Service: Cardiovascular;  Laterality: N/A;  . PERIPHERAL VASCULAR THROMBECTOMY N/A 07/20/2017   Procedure: PERIPHERAL VASCULAR THROMBECTOMY;  Surgeon: Serafina Mitchell, MD;  Location: [place] [facility];  Service: Cardiovascular;  Laterality: N/A;  . PERIPHERAL VASCULAR THROMBECTOMY N/A 07/21/2017   Procedure: PERIPHERAL VASCULAR THROMBECTOMY - LYSIS RECHECK;  Surgeon: Waynetta Sandy, MD;  Location: [place] [facility];  Service: Cardiovascular;  Laterality: N/A;  . scrotum cyst      Family History  Problem Relation Age of Onset  . Hypertension Mother     Social History   Socioeconomic History  . Marital status: Single    Spouse name: Not on file  . Number of children: Not on file  . Years of education: Not on file  . Highest education level: Not on file  Occupational History  . Not on file  Tobacco Use  . Smoking status: Current Some Day Smoker    Types: Cigarettes    Last attempt to quit: 02/03/2017    Years since quitting: 2.7  . Smokeless tobacco: Never Used  . Tobacco comment: 1 pk lasts 2 days. "Does not inhale"  Vaping Use  . Vaping Use: Never  used  Substance and Sexual Activity  . Alcohol use: Yes  . Drug use: No  . Sexual activity: Yes    Partners: Male    Birth control/protection: None  Other Topics Concern  . Not on file  Social History Narrative  . Not on file   Social Determinants of Health   Financial Resource Strain:   . Difficulty of Paying Living Expenses:   Food Insecurity:   . Worried About Charity fundraiser in the Last Year:   . Arboriculturist in the Last Year:   Transportation Needs:   . Film/video editor (Medical):   Marland Kitchen Lack of Transportation (Non-Medical):   Physical Activity:   . Days of Exercise per Week:   . Minutes of Exercise per Session:   Stress:   . Feeling of Stress :   Social Connections:   . Frequency of Communication with Friends and Family:   . Frequency of Social Gatherings with Friends and Family:   . Attends Religious Services:   . Active Member of Clubs or Organizations:   . Attends Archivist Meetings:   Marland Kitchen Marital Status:   Intimate Partner Violence:   . Fear of Current or Ex-Partner:   . Emotionally Abused:   Marland Kitchen Physically Abused:   . Sexually Abused:     Outpatient Medications Prior to Visit  Medication Sig Dispense Refill  . aspirin 81 MG chewable tablet Chew 1 tablet (81  mg total) by mouth daily. 30 tablet 0  . atorvastatin (LIPITOR) 40 MG tablet Take 1 tablet (40 mg total) by mouth daily. 90 tablet 1  . bictegravir-emtricitabine-tenofovir AF (BIKTARVY) 50-200-25 MG TABS tablet Take 1 tablet by mouth daily. 30 tablet 5   No facility-administered medications prior to visit.    No Known Allergies  ROS Review of Systems  All other systems reviewed and are negative.     Objective:    Physical Exam Vitals reviewed.  Constitutional:      Appearance: Normal appearance.  HENT:     Head: Normocephalic.     Nose: Nose normal.  Eyes:     Extraocular Movements: Extraocular movements intact.  Cardiovascular:     Rate and Rhythm: Normal rate and  regular rhythm.     Pulses: Normal pulses.     Heart sounds: Normal heart sounds.  Pulmonary:     Effort: Pulmonary effort is normal.     Breath sounds: Normal breath sounds.  Abdominal:     General: Abdomen is flat.  Musculoskeletal:        General: Normal range of motion.     Cervical back: Normal range of motion.  Skin:    General: Skin is warm and dry.  Neurological:     Mental Status: He is alert and oriented to person, place, and time.  Psychiatric:        Mood and Affect: Mood normal.        Behavior: Behavior normal.        Thought Content: Thought content normal.        Judgment: Judgment normal.     BP (!) 155/81 (BP Location: Right Arm, Patient Position: Sitting, Cuff Size: Normal)   Pulse 82   Temp 98.1 F (36.7 C) (Oral)   Ht 5\' 7"  (1.702 m)   Wt 172 lb (78 kg)   SpO2 94%   BMI 26.94 kg/m  Wt Readings from Last 3 Encounters:  11/19/19 172 lb (78 kg)  07/25/19 179 lb 9.6 oz (81.5 kg)  03/26/19 176 lb 9.6 oz (80.1 kg)     Health Maintenance Due  Topic Date Due  . FOOT EXAM  Never done  . OPHTHALMOLOGY EXAM  Never done  . URINE MICROALBUMIN  Never done  . INFLUENZA VACCINE  11/04/2019    There are no preventive care reminders to display for this patient.  Lab Results  Component Value Date   TSH 1.79 05/19/2017   Lab Results  Component Value Date   WBC 6.5 11/01/2019   HGB 15.4 11/01/2019   HCT 45.3 11/01/2019   MCV 96.6 11/01/2019   PLT 169 11/01/2019   Lab Results  Component Value Date   NA 140 11/01/2019   K 4.1 11/01/2019   CO2 26 11/01/2019   GLUCOSE 105 (H) 11/01/2019   BUN 13 11/01/2019   CREATININE 1.02 11/01/2019   BILITOT 0.7 11/01/2019   ALKPHOS 62 11/10/2017   AST 18 11/01/2019   ALT 38 11/01/2019   PROT 6.6 11/01/2019   ALBUMIN 4.2 11/10/2017   CALCIUM 9.5 11/01/2019   ANIONGAP 8 07/23/2017   Lab Results  Component Value Date   CHOL 212 (H) 06/06/2019   Lab Results  Component Value Date   HDL 48 06/06/2019    Lab Results  Component Value Date   LDLCALC 129 (H) 06/06/2019   Lab Results  Component Value Date   TRIG 197 (H) 06/06/2019   Lab Results  Component Value  Date   CHOLHDL 4.4 06/06/2019   Lab Results  Component Value Date   HGBA1C 6.5 (H) 06/06/2019      Assessment & Plan:  [name] was seen today for prediabetes and ppd placement.  Diagnoses and all orders for this visit:  Prediabetes -     HgB A1c 6.1 was 6.5 5 months ago  -     Microalbumin/Creatinine Ratio, Urine Future Continue to modify diet and exercise no medication needed at this time.  Pre-employment examination -     PPD  Mixed hyperlipidemia  continue atorvastatin 40 mg Your LDL is not in range. Decrease your fatty foods, red meat, cheese, milk and increase fiber like whole grains and veggies. You can also add a fiber supplement like Metamucil or Benefiber.      No orders of the defined types were placed in this encounter.   Follow-up: No follow-ups on file.    Kerin Perna, NP

## 2019-11-19 NOTE — Patient Instructions (Addendum)
Nice to see you.  Continue to take your Candor daily as prescribed.  Refills will be sent to the pharmacy.  Plan for follow up in 4 months or sooner if needed with lab work 1-2 weeks prior to appointment.

## 2019-11-19 NOTE — Progress Notes (Signed)
Subjective:    Patient ID: Juan Johnston, male    DOB: Jan 14, 1951, 69 y.o.   MRN: [id number]  Chief Complaint  Patient presents with  . Follow-up    no complaints; declined condoms;      HPI:  Juan Johnston is a 69 y.o. male with HIV disease last seen in the office on 07/25/19 with well-controlled HIV disease with good adherence and tolerance to his ART regimen of Biktarvy.  Viral load at the time was found to be 62 with CD4 count of 452.  Most recent blood work completed on 11/01/2019 with CD4 count of 437 and viral load that remains undetectable.  Here today for routine follow-up.  Juan Johnston continues to take his Biktarvy as prescribed daily with no adverse side effects or missed doses since his last office visit. Overall feeling well today with no new concerns/complaints. Denies fevers, chills, night sweats, headaches, changes in vision, neck pain/stiffness, nausea, diarrhea, vomiting, lesions or rashes.  Juan Johnston has no problems obtaining his medication from the pharmacy. Denies feeling down, depressed or hopeless. No recreational or illicit drug use. Drinks alcohol uses tobacco at about 1/2 pack per day. Contemplating getting married.  Received Moderna vaccination in March and April.  No Known Allergies    Outpatient Medications Prior to Visit  Medication Sig Dispense Refill  . aspirin 81 MG chewable tablet Chew 1 tablet (81 mg total) by mouth daily. 30 tablet 0  . atorvastatin (LIPITOR) 40 MG tablet Take 1 tablet (40 mg total) by mouth daily. 90 tablet 1  . bictegravir-emtricitabine-tenofovir AF (BIKTARVY) 50-200-25 MG TABS tablet Take 1 tablet by mouth daily. 30 tablet 5   No facility-administered medications prior to visit.     Past Medical History:  Diagnosis Date  . HIV infection ([place])   . Thyroid goiter      Past Surgical History:  Procedure Laterality Date  . INTRAVASCULAR ULTRASOUND/IVUS N/A 07/21/2017   Procedure: Intravascular Ultrasound/IVUS;   Surgeon: Waynetta Sandy, MD;  Location: [place] [facility];  Service: Cardiovascular;  Laterality: N/A;  . PERIPHERAL VASCULAR THROMBECTOMY N/A 07/20/2017   Procedure: PERIPHERAL VASCULAR THROMBECTOMY;  Surgeon: Serafina Mitchell, MD;  Location: [place] [facility];  Service: Cardiovascular;  Laterality: N/A;  . PERIPHERAL VASCULAR THROMBECTOMY N/A 07/21/2017   Procedure: PERIPHERAL VASCULAR THROMBECTOMY - LYSIS RECHECK;  Surgeon: Waynetta Sandy, MD;  Location: [place] [facility];  Service: Cardiovascular;  Laterality: N/A;  . scrotum cyst        Review of Systems  Constitutional: Negative for appetite change, chills, fatigue, fever and unexpected weight change.  Eyes: Negative for visual disturbance.  Respiratory: Negative for cough, chest tightness, shortness of breath and wheezing.   Cardiovascular: Negative for chest pain and leg swelling.  Gastrointestinal: Negative for abdominal pain, constipation, diarrhea, nausea and vomiting.  Genitourinary: Negative for dysuria, flank pain, frequency, genital sores, hematuria and urgency.  Skin: Negative for rash.  Allergic/Immunologic: Negative for immunocompromised state.  Neurological: Negative for dizziness and headaches.      Objective:    BP 120/73   Pulse 70   Wt 172 lb (78 kg)   BMI 26.94 kg/m  Nursing note and vital signs reviewed.  Physical Exam Constitutional:      General: He is not in acute distress.    Appearance: He is well-developed.  Eyes:     Conjunctiva/sclera: Conjunctivae normal.  Cardiovascular:     Rate and Rhythm: Normal rate and regular rhythm.  Heart sounds: Normal heart sounds. No murmur heard.  No friction rub. No gallop.   Pulmonary:     Effort: Pulmonary effort is normal. No respiratory distress.     Breath sounds: Normal breath sounds. No wheezing or rales.  Chest:     Chest wall: No tenderness.  Abdominal:     General: Bowel sounds are normal.     Palpations: Abdomen is  soft.     Tenderness: There is no abdominal tenderness.  Musculoskeletal:     Cervical back: Neck supple.  Lymphadenopathy:     Cervical: No cervical adenopathy.  Skin:    General: Skin is warm and dry.     Findings: No rash.  Neurological:     Mental Status: He is alert and oriented to person, place, and time.  Psychiatric:        Behavior: Behavior normal.        Thought Content: Thought content normal.        Judgment: Judgment normal.      Depression screen [facility] 2/9 11/19/2019 11/19/2019 06/05/2019 03/26/2019 12/12/2018  Decreased Interest 0 0 0 0 0  Down, Depressed, Hopeless 0 0 0 0 1  PHQ - 2 Score 0 0 0 0 1  Altered sleeping - - - - -  Tired, decreased energy - - - - -  Change in appetite - - - - -  Feeling bad or failure about yourself  - - - - -  Trouble concentrating - - - - -  Moving slowly or fidgety/restless - - - - -  Suicidal thoughts - - - - -  PHQ-9 Score - - - - -       Assessment & Plan:    Patient Active Problem List   Diagnosis Date Noted  . Erectile dysfunction due to diseases classified elsewhere 03/26/2019  . Rash and nonspecific skin eruption 12/12/2018  . Hyperlipidemia associated with type 2 diabetes mellitus ([place]) 05/11/2018  . Leg DVT (deep venous thromboembolism), chronic, right ([place]) 07/20/2017  . Asymptomatic HIV infection ([place]) 07/19/2017  . GERD (gastroesophageal reflux disease) 07/19/2017  . Cough 07/15/2017  . Thyroid goiter 05/19/2017  . HIV disease ([place]) 04/21/2017  . Healthcare maintenance 04/21/2017  . Hyperlipidemia 04/21/2017     Problem List Items Addressed This Visit      Other   HIV disease ([place]) - Primary    Juan Johnston is a 69 year old male with well-controlled HIV disease with good adherence and tolerance to his ART regimen of Biktarvy.  No signs/symptoms of opportunistic infection or progressive HIV disease.  Reviewed lab work and discussed the plan of care.  He has no problems obtaining medication from the pharmacy.   Continue current dose of Biktarvy.  Plan for follow-up in 4 months or sooner if needed with lab work 1 to 2 weeks prior to appointment.      Relevant Medications   bictegravir-emtricitabine-tenofovir AF (BIKTARVY) 50-200-25 MG TABS tablet   Other Relevant Orders   HIV-1 RNA quant-no reflex-bld   CBC   T-helper cell (CD4)- ([facility] clinic only)   Comprehensive metabolic panel   Healthcare maintenance     Discussed importance of safe sexual practice to reduce risk of STI.  Condoms declined.  Covid vaccine up-to-date per recommendations.  Recommended routine dental care.          I am having Fatih Stalvey maintain his aspirin, atorvastatin, and Biktarvy.   Meds ordered this encounter  Medications  . bictegravir-emtricitabine-tenofovir AF (BIKTARVY) 50-200-25  MG TABS tablet    Sig: Take 1 tablet by mouth daily.    Dispense:  30 tablet    Refill:  5    Order Specific Question:   Supervising Provider    Answer:   Carlyle Basques [[id number]]     Follow-up: Return in about 4 months (around 03/20/2020), or if symptoms worsen or fail to improve.   Terri Piedra, MSN, FNP-C Nurse Practitioner [facility] for Infectious Disease [place] number: [phone]

## 2019-11-19 NOTE — Assessment & Plan Note (Signed)
Juan Johnston is a 69 year old male with well-controlled HIV disease with good adherence and tolerance to his ART regimen of Biktarvy.  No signs/symptoms of opportunistic infection or progressive HIV disease.  Reviewed lab work and discussed the plan of care.  He has no problems obtaining medication from the pharmacy.  Continue current dose of Biktarvy.  Plan for follow-up in 4 months or sooner if needed with lab work 1 to 2 weeks prior to appointment.

## 2019-11-19 NOTE — Assessment & Plan Note (Signed)
   Discussed importance of safe sexual practice to reduce risk of STI.  Condoms declined.  Covid vaccine up-to-date per recommendations.  Recommended routine dental care.

## 2019-11-22 ENCOUNTER — Ambulatory Visit (INDEPENDENT_AMBULATORY_CARE_PROVIDER_SITE_OTHER): Payer: Medicare (Managed Care)

## 2019-11-22 ENCOUNTER — Other Ambulatory Visit: Payer: Self-pay

## 2019-12-17 ENCOUNTER — Other Ambulatory Visit (INDEPENDENT_AMBULATORY_CARE_PROVIDER_SITE_OTHER): Payer: Self-pay | Admitting: Primary Care

## 2020-02-18 ENCOUNTER — Other Ambulatory Visit: Payer: Self-pay

## 2020-02-18 ENCOUNTER — Other Ambulatory Visit: Payer: Medicare (Managed Care)

## 2020-02-18 DIAGNOSIS — B20 Human immunodeficiency virus [HIV] disease: Secondary | ICD-10-CM

## 2020-02-19 LAB — T-HELPER CELL (CD4) - (RCID CLINIC ONLY)
CD4 % Helper T Cell: 25 % — ABNORMAL LOW (ref 33–65)
CD4 T Cell Abs: 404 /uL (ref 400–1790)

## 2020-02-20 LAB — COMPREHENSIVE METABOLIC PANEL
AG Ratio: 1.6 (calc) (ref 1.0–2.5)
ALT: 30 U/L (ref 9–46)
AST: 21 U/L (ref 10–35)
Albumin: 4.4 g/dL (ref 3.6–5.1)
Alkaline phosphatase (APISO): 79 U/L (ref 35–144)
BUN: 16 mg/dL (ref 7–25)
CO2: 27 mmol/L (ref 20–32)
Calcium: 9.7 mg/dL (ref 8.6–10.3)
Chloride: 104 mmol/L (ref 98–110)
Creat: 0.9 mg/dL (ref 0.70–1.25)
Globulin: 2.8 g/dL (calc) (ref 1.9–3.7)
Glucose, Bld: 117 mg/dL — ABNORMAL HIGH (ref 65–99)
Potassium: 4.3 mmol/L (ref 3.5–5.3)
Sodium: 138 mmol/L (ref 135–146)
Total Bilirubin: 0.5 mg/dL (ref 0.2–1.2)
Total Protein: 7.2 g/dL (ref 6.1–8.1)

## 2020-02-20 LAB — CBC
HCT: 47.5 % (ref 38.5–50.0)
Hemoglobin: 16.4 g/dL (ref 13.2–17.1)
MCH: 32.8 pg (ref 27.0–33.0)
MCHC: 34.5 g/dL (ref 32.0–36.0)
MCV: 95 fL (ref 80.0–100.0)
MPV: 11.3 fL (ref 7.5–12.5)
Platelets: 173 10*3/uL (ref 140–400)
RBC: 5 10*6/uL (ref 4.20–5.80)
RDW: 12.9 % (ref 11.0–15.0)
WBC: 6.1 10*3/uL (ref 3.8–10.8)

## 2020-02-20 LAB — HIV-1 RNA QUANT-NO REFLEX-BLD
HIV 1 RNA Quant: <20 Copies/mL
HIV-1 RNA Quant, Log: <1.3 Log cps/mL

## 2020-03-04 ENCOUNTER — Other Ambulatory Visit: Payer: Self-pay

## 2020-03-04 ENCOUNTER — Encounter: Payer: Self-pay | Admitting: Family

## 2020-03-04 ENCOUNTER — Ambulatory Visit (INDEPENDENT_AMBULATORY_CARE_PROVIDER_SITE_OTHER): Payer: Medicare (Managed Care) | Admitting: Family

## 2020-03-04 VITALS — BP 148/82 | HR 75 | Temp 98.0°F | Ht 68.0 in

## 2020-03-04 DIAGNOSIS — Z0000 Encounter for general adult medical examination without abnormal findings: Secondary | ICD-10-CM

## 2020-03-04 DIAGNOSIS — B20 Human immunodeficiency virus [HIV] disease: Secondary | ICD-10-CM

## 2020-03-04 MED ORDER — BIKTARVY 50-200-25 MG PO TABS: 1.0000 | ORAL_TABLET | Freq: Every day | ORAL | 5 refills | Status: DC

## 2020-03-04 NOTE — Assessment & Plan Note (Signed)
   Covid vaccines up-to-date per recommendations.  Discussed importance of safe sexual practice to reduce risk of STI.  Condoms declined.  Due for influenza vaccination.  Our clinic currently does not have high dose which I recommend for him.  Will be due for colon cancer screening through [facility] in the coming month.

## 2020-03-04 NOTE — Progress Notes (Signed)
Subjective:    Patient ID: Juan Johnston, male    DOB: 09-08-1950, 70 y.o.   MRN: [id number]  Chief Complaint  Patient presents with  . Follow-up     HPI:  Juan Johnston is a 69 y.o. male with HIV disease who was last seen in the office on 11/19/2019 with good adherence and tolerance to his ART regimen of Biktarvy.  Lab work at the time showed a viral load that was undetectable and CD4 count of 437.  Most recent blood work completed on 02/18/2020 with viral load that remains undetectable and CD4 count of 404.  Kidney function, liver function, electrolytes within normal ranges.  Here today for routine follow-up.  Juan Johnston continues to take his Biktarvy daily as prescribed with no adverse side effects or missed doses since his last office visit.  Overall feeling well today with no new concerns/complaints. Denies fevers, chills, night sweats, headaches, changes in vision, neck pain/stiffness, nausea, diarrhea, vomiting, lesions or rashes.  Juan Johnston has no problems obtaining his medication from the pharmacy remains covered through [facility].  Denies feelings of being down, depressed, or hopeless recently.  No recreational or illicit drug use or alcohol consumption.  Does continue to smoke approximately 1/2 pack of cigarettes per day.  Interested in receiving flu vaccine.  Covid vaccines are up-to-date per recommendations.  Due for routine dental care.  No Known Allergies    Outpatient Medications Prior to Visit  Medication Sig Dispense Refill  . aspirin 81 MG chewable tablet Chew 1 tablet (81 mg total) by mouth daily. 30 tablet 0  . atorvastatin (LIPITOR) 40 MG tablet TAKE 1 TABLET(40 MG) BY MOUTH DAILY 90 tablet 2  . bictegravir-emtricitabine-tenofovir AF (BIKTARVY) 50-200-25 MG TABS tablet Take 1 tablet by mouth daily. 30 tablet 5   No facility-administered medications prior to visit.     Past Medical History:  Diagnosis Date  . HIV infection ([place])   .  Thyroid goiter      Past Surgical History:  Procedure Laterality Date  . INTRAVASCULAR ULTRASOUND/IVUS N/A 07/21/2017   Procedure: Intravascular Ultrasound/IVUS;  Surgeon: Waynetta Sandy, MD;  Location: [place] [facility];  Service: Cardiovascular;  Laterality: N/A;  . PERIPHERAL VASCULAR THROMBECTOMY N/A 07/20/2017   Procedure: PERIPHERAL VASCULAR THROMBECTOMY;  Surgeon: Serafina Mitchell, MD;  Location: [place] [facility];  Service: Cardiovascular;  Laterality: N/A;  . PERIPHERAL VASCULAR THROMBECTOMY N/A 07/21/2017   Procedure: PERIPHERAL VASCULAR THROMBECTOMY - LYSIS RECHECK;  Surgeon: Waynetta Sandy, MD;  Location: [place] [facility];  Service: Cardiovascular;  Laterality: N/A;  . scrotum cyst      Review of Systems  Constitutional: Negative for appetite change, chills, fatigue, fever and unexpected weight change.  Eyes: Negative for visual disturbance.  Respiratory: Negative for cough, chest tightness, shortness of breath and wheezing.   Cardiovascular: Negative for chest pain and leg swelling.  Gastrointestinal: Negative for abdominal pain, constipation, diarrhea, nausea and vomiting.  Genitourinary: Negative for dysuria, flank pain, frequency, genital sores, hematuria and urgency.  Skin: Negative for rash.  Allergic/Immunologic: Negative for immunocompromised state.  Neurological: Negative for dizziness and headaches.      Objective:    BP (!) 148/82   Pulse 75   Temp 98 F (36.7 C)   Ht 5\' 8"  (1.727 m)   BMI 26.15 kg/m  Nursing note and vital signs reviewed.  Physical Exam Constitutional:      General: He is not in acute distress.    Appearance:  He is well-developed.  Eyes:     Conjunctiva/sclera: Conjunctivae normal.  Cardiovascular:     Rate and Rhythm: Normal rate and regular rhythm.     Heart sounds: Normal heart sounds. No murmur heard.  No friction rub. No gallop.   Pulmonary:     Effort: Pulmonary effort is normal. No respiratory  distress.     Breath sounds: Normal breath sounds. No wheezing or rales.  Chest:     Chest wall: No tenderness.  Abdominal:     General: Bowel sounds are normal.     Palpations: Abdomen is soft.     Tenderness: There is no abdominal tenderness.  Musculoskeletal:     Cervical back: Neck supple.  Lymphadenopathy:     Cervical: No cervical adenopathy.  Skin:    General: Skin is warm and dry.     Findings: No rash.  Neurological:     Mental Status: He is alert and oriented to person, place, and time.  Psychiatric:        Behavior: Behavior normal.        Thought Content: Thought content normal.        Judgment: Judgment normal.      Depression screen [facility] 2/9 03/04/2020 11/19/2019 11/19/2019 06/05/2019 03/26/2019  Decreased Interest 0 0 0 0 0  Down, Depressed, Hopeless 0 0 0 0 0  PHQ - 2 Score 0 0 0 0 0  Altered sleeping - - - - -  Tired, decreased energy - - - - -  Change in appetite - - - - -  Feeling bad or failure about yourself  - - - - -  Trouble concentrating - - - - -  Moving slowly or fidgety/restless - - - - -  Suicidal thoughts - - - - -  PHQ-9 Score - - - - -       Assessment & Plan:    Patient Active Problem List   Diagnosis Date Noted  . Erectile dysfunction due to diseases classified elsewhere 03/26/2019  . Rash and nonspecific skin eruption 12/12/2018  . Hyperlipidemia associated with type 2 diabetes mellitus ([place]) 05/11/2018  . Leg DVT (deep venous thromboembolism), chronic, right ([place]) 07/20/2017  . Asymptomatic HIV infection ([place]) 07/19/2017  . GERD (gastroesophageal reflux disease) 07/19/2017  . Cough 07/15/2017  . Thyroid goiter 05/19/2017  . HIV disease ([place]) 04/21/2017  . Healthcare maintenance 04/21/2017  . Hyperlipidemia 04/21/2017     Problem List Items Addressed This Visit      Other   HIV disease [facility])    Juan Johnston continues to have well-controlled HIV disease with good adherence and tolerance to his ART regimen of Biktarvy.  No  signs/symptoms of opportunistic infection or progressive HIV disease.  We reviewed lab work and discussed plan of care.  Continue current dose of Biktarvy.  Plan for follow-up in 4 months or sooner if needed with lab work 1 to 2 weeks prior to appointment.      Relevant Medications   bictegravir-emtricitabine-tenofovir AF (BIKTARVY) 50-200-25 MG TABS tablet   Healthcare maintenance     Covid vaccines up-to-date per recommendations.  Discussed importance of safe sexual practice to reduce risk of STI.  Condoms declined.  Due for influenza vaccination.  Our clinic currently does not have high dose which I recommend for him.  Will be due for colon cancer screening through [facility] in the coming month.          I am having Juan Johnston maintain his aspirin,  atorvastatin, and Biktarvy.   Meds ordered this encounter  Medications  . bictegravir-emtricitabine-tenofovir AF (BIKTARVY) 50-200-25 MG TABS tablet    Sig: Take 1 tablet by mouth daily.    Dispense:  30 tablet    Refill:  5    Order Specific Question:   Supervising Provider    Answer:   Carlyle Basques [[id number]]     Follow-up: Return in about 4 months (around 07/02/2020), or if symptoms worsen or fail to improve.   Terri Piedra, MSN, FNP-C Nurse Practitioner [facility] [place] number: [phone]

## 2020-03-04 NOTE — Patient Instructions (Addendum)
Nice to see you.  We will continue your current dose of Biktarvy.  Refills have been sent to the pharmacy.  Recommend high dose flu shot.   Plan for follow up in 4 months or sooner if needed with lab work 1-2 weeks prior to appointment.   Have a great day and stay safe!  Happy Holidays!

## 2020-03-04 NOTE — Assessment & Plan Note (Signed)
Juan Johnston continues to have well-controlled HIV disease with good adherence and tolerance to his ART regimen of Biktarvy.  No signs/symptoms of opportunistic infection or progressive HIV disease.  We reviewed lab work and discussed plan of care.  Continue current dose of Biktarvy.  Plan for follow-up in 4 months or sooner if needed with lab work 1 to 2 weeks prior to appointment.

## 2020-04-14 ENCOUNTER — Ambulatory Visit (INDEPENDENT_AMBULATORY_CARE_PROVIDER_SITE_OTHER): Payer: Medicare (Managed Care)

## 2020-04-14 ENCOUNTER — Other Ambulatory Visit: Payer: Self-pay

## 2020-04-14 DIAGNOSIS — Z23 Encounter for immunization: Secondary | ICD-10-CM

## 2020-06-12 ENCOUNTER — Other Ambulatory Visit: Payer: Self-pay

## 2020-06-12 DIAGNOSIS — B20 Human immunodeficiency virus [HIV] disease: Secondary | ICD-10-CM

## 2020-06-12 DIAGNOSIS — Z113 Encounter for screening for infections with a predominantly sexual mode of transmission: Secondary | ICD-10-CM

## 2020-06-12 DIAGNOSIS — Z79899 Other long term (current) drug therapy: Secondary | ICD-10-CM

## 2020-06-17 ENCOUNTER — Other Ambulatory Visit: Payer: Medicare (Managed Care)

## 2020-06-17 ENCOUNTER — Other Ambulatory Visit: Payer: Self-pay

## 2020-06-17 DIAGNOSIS — Z79899 Other long term (current) drug therapy: Secondary | ICD-10-CM

## 2020-06-17 DIAGNOSIS — B20 Human immunodeficiency virus [HIV] disease: Secondary | ICD-10-CM

## 2020-06-18 LAB — T-HELPER CELL (CD4) - (RCID CLINIC ONLY)
CD4 % Helper T Cell: 26 % — ABNORMAL LOW (ref 33–65)
CD4 T Cell Abs: 438 /uL (ref 400–1790)

## 2020-06-19 LAB — COMPLETE METABOLIC PANEL WITH GFR
AG Ratio: 1.9 (calc) (ref 1.0–2.5)
ALT: 54 U/L — ABNORMAL HIGH (ref 9–46)
AST: 29 U/L (ref 10–35)
Albumin: 4.4 g/dL (ref 3.6–5.1)
Alkaline phosphatase (APISO): 72 U/L (ref 35–144)
BUN: 14 mg/dL (ref 7–25)
CO2: 24 mmol/L (ref 20–32)
Calcium: 9.9 mg/dL (ref 8.6–10.3)
Chloride: 105 mmol/L (ref 98–110)
Creat: 1.17 mg/dL (ref 0.70–1.25)
GFR, Est African American: 73 mL/min/{1.73_m2} (ref >=60)
GFR, Est Non African American: 63 mL/min/{1.73_m2} (ref >=60)
Globulin: 2.3 g/dL (calc) (ref 1.9–3.7)
Glucose, Bld: 81 mg/dL (ref 65–99)
Potassium: 4 mmol/L (ref 3.5–5.3)
Sodium: 140 mmol/L (ref 135–146)
Total Bilirubin: 0.7 mg/dL (ref 0.2–1.2)
Total Protein: 6.7 g/dL (ref 6.1–8.1)

## 2020-06-19 LAB — CBC WITH DIFFERENTIAL/PLATELET
Absolute Monocytes: 367 cells/uL (ref 200–950)
Basophils Absolute: 38 cells/uL (ref 0–200)
Basophils Relative: 0.7 %
Eosinophils Absolute: 140 cells/uL (ref 15–500)
Eosinophils Relative: 2.6 %
HCT: 46 % (ref 38.5–50.0)
Hemoglobin: 15.8 g/dL (ref 13.2–17.1)
Lymphs Abs: 1712 cells/uL (ref 850–3900)
MCH: 33.1 pg — ABNORMAL HIGH (ref 27.0–33.0)
MCHC: 34.3 g/dL (ref 32.0–36.0)
MCV: 96.4 fL (ref 80.0–100.0)
MPV: 11.6 fL (ref 7.5–12.5)
Monocytes Relative: 6.8 %
Neutro Abs: 3143 cells/uL (ref 1500–7800)
Neutrophils Relative %: 58.2 %
Platelets: 187 10*3/uL (ref 140–400)
RBC: 4.77 10*6/uL (ref 4.20–5.80)
RDW: 12.4 % (ref 11.0–15.0)
Total Lymphocyte: 31.7 %
WBC: 5.4 10*3/uL (ref 3.8–10.8)

## 2020-06-19 LAB — HIV-1 RNA QUANT-NO REFLEX-BLD
HIV 1 RNA Quant: NOT DETECTED Copies/mL
HIV-1 RNA Quant, Log: NOT DETECTED Log cps/mL

## 2020-06-19 LAB — LIPID PANEL
Cholesterol: 175 mg/dL (ref <200)
HDL: 41 mg/dL (ref >=40)
LDL Cholesterol (Calc): 102 mg/dL (calc) — ABNORMAL HIGH
Non-HDL Cholesterol (Calc): 134 mg/dL (calc) — ABNORMAL HIGH (ref <130)
Total CHOL/HDL Ratio: 4.3 (calc) (ref <5.0)
Triglycerides: 198 mg/dL — ABNORMAL HIGH (ref <150)

## 2020-07-02 ENCOUNTER — Other Ambulatory Visit: Payer: Self-pay

## 2020-07-02 ENCOUNTER — Ambulatory Visit (INDEPENDENT_AMBULATORY_CARE_PROVIDER_SITE_OTHER): Payer: Medicare (Managed Care) | Admitting: Family

## 2020-07-02 ENCOUNTER — Encounter: Payer: Self-pay | Admitting: Family

## 2020-07-02 VITALS — BP 136/80 | HR 73 | Wt 173.0 lb

## 2020-07-02 DIAGNOSIS — Z0000 Encounter for general adult medical examination without abnormal findings: Secondary | ICD-10-CM

## 2020-07-02 DIAGNOSIS — B20 Human immunodeficiency virus [HIV] disease: Secondary | ICD-10-CM | POA: Diagnosis not present

## 2020-07-02 MED ORDER — BIKTARVY 50-200-25 MG PO TABS
1.0000 | ORAL_TABLET | Freq: Every day | ORAL | 5 refills | Status: DC
Start: 2020-07-02 — End: 2021-01-01

## 2020-07-02 NOTE — Patient Instructions (Addendum)
Nice to see you.  We will continue your Biktarvy daily as prescribed.   Refills have been sent to the pharmacy.  Plan for follow up in 6 months or sooner if needed.   Have a great day and stay safe!

## 2020-07-02 NOTE — Assessment & Plan Note (Signed)
   Discussed importance of safe sexual practice to reduce risk of STI.  Condoms provided.  Due for routine dental care which will be scheduled independently  Covid vaccines and booster is up-to-date along with other immunizations.

## 2020-07-02 NOTE — Assessment & Plan Note (Signed)
Mr. Sessler continues to have well-controlled HIV disease with good adherence and tolerance to his ART regimen of Biktarvy.  No signs/symptoms of opportunistic infection or progressive HIV.  We reviewed lab work and discussed plan of care.  Continue current dose of Biktarvy.  Plan for follow-up in 6 months or sooner if needed with lab work 1 to 2 weeks prior to appointment.

## 2020-07-02 NOTE — Progress Notes (Signed)
Brief Narrative  Patient ID: Juan Johnston, male    DOB: 10/28/1950, 70 y.o.   MRN: [id number]    Subjective:    Chief Complaint  Patient presents with  . Follow-up    Given condoms; no questions/concerns;      HPI:  Juan Johnston is a 70 y.o. male with HIV disease last seen on 03/04/2020 with well-controlled virus and good adherence and tolerance to his ART regimen of Biktarvy.  Lab work at the time showed a viral load that was undetectable with CD4 count of 404.  Most recent blood work completed on 06/17/2020 with viral load that remains undetectable and CD4 count of 438.  Kidney function, liver function, and electrolytes within normal ranges.  Triglycerides of 198, LDL 102, and HDL of 41.  Here today for routine follow-up.  Juan Johnston continues to take his Biktarvy daily as prescribed with no adverse side effects or missed doses since his last office visit.  Overall feeling well today with no new concerns/complaints.  Does have several questions regarding [place]. Denies fevers, chills, night sweats, headaches, changes in vision, neck pain/stiffness, nausea, diarrhea, vomiting, lesions or rashes.  Juan Johnston has no problems obtaining medication from the pharmacy and remains covered through [facility].  Denies feelings of being down, depressed, or hopeless recently.  No recreational or illicit drug use with occasional alcohol and tobacco use.  Condoms provided.  Due for routine dental care..  Has received both Covid vaccines and boosters.   No Known Allergies    Outpatient Medications Prior to Visit  Medication Sig Dispense Refill  . aspirin 81 MG chewable tablet Chew 1 tablet (81 mg total) by mouth daily. 30 tablet 0  . atorvastatin (LIPITOR) 40 MG tablet TAKE 1 TABLET(40 MG) BY MOUTH DAILY 90 tablet 2  . bictegravir-emtricitabine-tenofovir AF (BIKTARVY) 50-200-25 MG TABS tablet Take 1 tablet by mouth daily. 30 tablet 5   No facility-administered medications  prior to visit.     Past Medical History:  Diagnosis Date  . HIV infection ([place])   . Thyroid goiter      Past Surgical History:  Procedure Laterality Date  . INTRAVASCULAR ULTRASOUND/IVUS N/A 07/21/2017   Procedure: Intravascular Ultrasound/IVUS;  Surgeon: Waynetta Sandy, MD;  Location: [place] [facility];  Service: Cardiovascular;  Laterality: N/A;  . PERIPHERAL VASCULAR THROMBECTOMY N/A 07/20/2017   Procedure: PERIPHERAL VASCULAR THROMBECTOMY;  Surgeon: Serafina Mitchell, MD;  Location: [place] [facility];  Service: Cardiovascular;  Laterality: N/A;  . PERIPHERAL VASCULAR THROMBECTOMY N/A 07/21/2017   Procedure: PERIPHERAL VASCULAR THROMBECTOMY - LYSIS RECHECK;  Surgeon: Waynetta Sandy, MD;  Location: [place] [facility];  Service: Cardiovascular;  Laterality: N/A;  . scrotum cyst         Review of Systems  Constitutional: Negative for appetite change, chills, fatigue, fever and unexpected weight change.  Eyes: Negative for visual disturbance.  Respiratory: Negative for cough, chest tightness, shortness of breath and wheezing.   Cardiovascular: Negative for chest pain and leg swelling.  Gastrointestinal: Negative for abdominal pain, constipation, diarrhea, nausea and vomiting.  Genitourinary: Negative for dysuria, flank pain, frequency, genital sores, hematuria and urgency.  Skin: Negative for rash.  Allergic/Immunologic: Negative for immunocompromised state.  Neurological: Negative for dizziness and headaches.      Objective:    BP 136/80   Pulse 73   Wt 173 lb (78.5 kg)   BMI 26.30 kg/m  Nursing note and vital signs reviewed.  Physical Exam Constitutional:  General: He is not in acute distress.    Appearance: He is well-developed.  Eyes:     Conjunctiva/sclera: Conjunctivae normal.  Cardiovascular:     Rate and Rhythm: Normal rate and regular rhythm.     Heart sounds: Normal heart sounds. No murmur heard. No friction rub. No gallop.    Pulmonary:     Effort: Pulmonary effort is normal. No respiratory distress.     Breath sounds: Normal breath sounds. No wheezing or rales.  Chest:     Chest wall: No tenderness.  Abdominal:     General: Bowel sounds are normal.     Palpations: Abdomen is soft.     Tenderness: There is no abdominal tenderness.  Musculoskeletal:     Cervical back: Neck supple.  Lymphadenopathy:     Cervical: No cervical adenopathy.  Skin:    General: Skin is warm and dry.     Findings: No rash.  Neurological:     Mental Status: He is alert and oriented to person, place, and time.  Psychiatric:        Behavior: Behavior normal.        Thought Content: Thought content normal.        Judgment: Judgment normal.      Depression screen [facility] 2/9 07/02/2020 03/04/2020 11/19/2019 11/19/2019 06/05/2019  Decreased Interest 0 0 0 0 0  Down, Depressed, Hopeless 0 0 0 0 0  PHQ - 2 Score 0 0 0 0 0  Altered sleeping - - - - -  Tired, decreased energy - - - - -  Change in appetite - - - - -  Feeling bad or failure about yourself  - - - - -  Trouble concentrating - - - - -  Moving slowly or fidgety/restless - - - - -  Suicidal thoughts - - - - -  PHQ-9 Score - - - - -       Assessment & Plan:    Patient Active Problem List   Diagnosis Date Noted  . Erectile dysfunction due to diseases classified elsewhere 03/26/2019  . Rash and nonspecific skin eruption 12/12/2018  . Hyperlipidemia associated with type 2 diabetes mellitus ([place]) 05/11/2018  . Leg DVT (deep venous thromboembolism), chronic, right ([place]) 07/20/2017  . Asymptomatic HIV infection ([place]) 07/19/2017  . GERD (gastroesophageal reflux disease) 07/19/2017  . Cough 07/15/2017  . Thyroid goiter 05/19/2017  . HIV disease ([place]) 04/21/2017  . Healthcare maintenance 04/21/2017  . Hyperlipidemia 04/21/2017     Problem List Items Addressed This Visit      Other   HIV disease ([place]) - Primary    Juan Johnston continues to have well-controlled HIV  disease with good adherence and tolerance to his ART regimen of Biktarvy.  No signs/symptoms of opportunistic infection or progressive HIV.  We reviewed lab work and discussed plan of care.  Continue current dose of Biktarvy.  Plan for follow-up in 6 months or sooner if needed with lab work 1 to 2 weeks prior to appointment.      Relevant Medications   bictegravir-emtricitabine-tenofovir AF (BIKTARVY) 50-200-25 MG TABS tablet   Other Relevant Orders   T-helper cell (CD4)- ([facility] clinic only)   HIV-1 RNA quant-no reflex-bld   Comprehensive metabolic panel   Healthcare maintenance     Discussed importance of safe sexual practice to reduce risk of STI.  Condoms provided.  Due for routine dental care which will be scheduled independently  Covid vaccines and booster is up-to-date along with other  immunizations.          I am having Juan Johnston maintain his aspirin, atorvastatin, and Biktarvy.   Meds ordered this encounter  Medications  . bictegravir-emtricitabine-tenofovir AF (BIKTARVY) 50-200-25 MG TABS tablet    Sig: Take 1 tablet by mouth daily.    Dispense:  30 tablet    Refill:  5    Order Specific Question:   Supervising Provider    Answer:   Carlyle Basques [[id number]]     Follow-up: Return in about 6 months (around 01/02/2021), or if symptoms worsen or fail to improve.   Terri Piedra, MSN, FNP-C Nurse Practitioner [facility] for Infectious Disease [place] number: [phone]

## 2020-08-04 ENCOUNTER — Other Ambulatory Visit (INDEPENDENT_AMBULATORY_CARE_PROVIDER_SITE_OTHER): Payer: Self-pay | Admitting: Primary Care

## 2020-08-07 ENCOUNTER — Telehealth (INDEPENDENT_AMBULATORY_CARE_PROVIDER_SITE_OTHER): Payer: Self-pay | Admitting: Primary Care

## 2020-08-14 NOTE — Telephone Encounter (Signed)
Pt has made an appt and is requesting that med be refilled as he took soonest appt on June 3  atorvastatin (LIPITOR) 40 MG tablet 90 tablet 2 12/17/2019    Sig: TAKE 1 TABLET(40 MG) BY MOUTH DAILY   Sent to pharmacy as: atorvastatin (LIPITOR) 40 MG tablet    [id number] - [place], [place] [address]  [address] [place] [address]  Phone: [phone] Fax: [phone]  Hours: Open 24 hours

## 2020-08-14 NOTE — Telephone Encounter (Signed)
Please advice  

## 2020-08-15 ENCOUNTER — Telehealth (INDEPENDENT_AMBULATORY_CARE_PROVIDER_SITE_OTHER): Payer: Self-pay

## 2020-08-15 NOTE — Telephone Encounter (Signed)
Copied from [place] [id number]. Topic: General - Other >> Aug 15, 2020 11:04 AM Tessa Lerner A wrote: Reason for CRM: Patient would like to be contacted by a member of staff to discuss coordinating prescription refills  Patient has concerns related to a bridge prescription until their appt on 09/05/20  Patient shares that they spoke with a staff member yesterday 08/14/20 and would like to continue the discussion   Patient was unable to remember the name of who they spoke with   Please contact to further advise when possible

## 2020-08-18 NOTE — Telephone Encounter (Signed)
Patient is aware that he will have to wait for refill until he presents to next office visit.

## 2020-09-05 ENCOUNTER — Ambulatory Visit (INDEPENDENT_AMBULATORY_CARE_PROVIDER_SITE_OTHER): Payer: Medicare (Managed Care) | Admitting: Primary Care

## 2020-09-05 ENCOUNTER — Other Ambulatory Visit: Payer: Self-pay

## 2020-09-05 ENCOUNTER — Encounter (INDEPENDENT_AMBULATORY_CARE_PROVIDER_SITE_OTHER): Payer: Self-pay | Admitting: Primary Care

## 2020-09-05 VITALS — BP 147/81 | HR 68 | Temp 97.5°F | Ht 68.0 in | Wt 181.4 lb

## 2020-09-05 DIAGNOSIS — R7303 Prediabetes: Secondary | ICD-10-CM | POA: Diagnosis not present

## 2020-09-05 DIAGNOSIS — E049 Nontoxic goiter, unspecified: Secondary | ICD-10-CM

## 2020-09-05 DIAGNOSIS — Z1211 Encounter for screening for malignant neoplasm of colon: Secondary | ICD-10-CM

## 2020-09-05 DIAGNOSIS — E782 Mixed hyperlipidemia: Secondary | ICD-10-CM | POA: Diagnosis not present

## 2020-09-05 DIAGNOSIS — B351 Tinea unguium: Secondary | ICD-10-CM | POA: Diagnosis not present

## 2020-09-05 LAB — POCT GLYCOSYLATED HEMOGLOBIN (HGB A1C): Hemoglobin A1C: 6.5 % — AB (ref 4.0–5.6)

## 2020-09-05 MED ORDER — LISINOPRIL 20 MG PO TABS: 20.0000 mg | ORAL_TABLET | Freq: Every day | ORAL | 1 refills | Status: DC

## 2020-09-05 MED ORDER — ATORVASTATIN CALCIUM 40 MG PO TABS: ORAL_TABLET | ORAL | 1 refills | Status: DC

## 2020-09-05 NOTE — Patient Instructions (Signed)
Goiter  A goiter is an enlarged thyroid gland. The thyroid is located in the lower front of the neck. It makes hormones that affect many body parts and systems, including the system that affects how quickly the body burns fuel for energy (metabolism). Most goiters are painless and are not a cause for concern. Some goiters can affect the way your thyroid makes thyroid hormones. Goiters and conditions that cause goiters can be treated, if necessary. What are the causes? Common causes of this condition include:  Lack (deficiency) of a mineral called iodine. The thyroid gland uses iodine to make thyroid hormones.  Diseases that attack healthy cells in the body (autoimmune diseases) and affect thyroid function, such as Graves' disease or Hashimoto's disease. These diseases may cause the body to produce too much thyroid hormone (hyperthyroidism) or too little of the hormone (hypothyroidism).  Conditions that cause inflammation of the thyroid (thyroiditis).  One or more small growths on the thyroid (nodular goiter). Other causes include:  Medical problems caused by abnormal genes that are passed from parent to child (genetic defects).  Thyroid injury or infection.  Tumors that may or may not be cancerous.  Pregnancy.  Certain medicines.  Exposure to radiation. In some cases, the cause may not be known. What increases the risk? This condition is more likely to develop in:  People who do not get enough iodine in their diet.  People who have a family history of goiter.  Women.  People who are older than age 40.  People who smoke tobacco.  People who have had exposure to radiation. What are the signs or symptoms? The main symptom of this condition is swelling in the lower, front part of the neck. This swelling can range from a very small bump to a large lump. Other symptoms may include:  A tight feeling in the throat.  A hoarse voice.  Coughing.  Wheezing.  Difficulty  swallowing or breathing.  Bulging veins in the neck.  Dizziness. When a goiter is the result of an overactive thyroid (hyperthyroidism), symptoms may also include:  Nervousness or restlessness.  Inability to tolerate heat.  Unexplained weight loss.  Diarrhea.  Change in the texture of hair or skin.  Changes in heartbeat, such as skipped beats, extra beats, or a rapid heart rate.  Loss of menstruation.  Shaky hands.  Increased appetite.  Sleep problems. When a goiter is the result of an underactive thyroid (hypothyroidism), symptoms may also include:  Feeling like you have no energy (lethargy).  Inability to tolerate cold.  Weight gain that is not explained by a change in diet or exercise habits.  Dry skin.  Coarse hair.  Irregular menstrual periods.  Constipation.  Sadness or depression.  Fatigue. In some cases, there may not be any symptoms and the thyroid hormone levels may be normal.   How is this diagnosed? This condition may be diagnosed based on your symptoms, your medical history, and a physical exam. You may have tests, such as:  Blood tests to check thyroid function.  Imaging tests, such as: ? Ultrasound. ? CT scan. ? MRI. ? Thyroid scan.  Removal of a tissue sample (biopsy) of the goiter or any nodules. The sample will be tested to check for cancer. How is this treated? Treatment for this condition depends on the cause and your symptoms. Treatment may include:  Medicines to regulate thyroid hormone levels.  Anti-inflammatory medicines or steroid medicines, if the goiter is caused by inflammation.  Iodine supplements or changes   to your diet, if the goiter is caused by iodine deficiency.  Radioactive iodine treatment.  Surgery to remove your thyroid. In some cases, you may only need regular check-ups with your health care provider to monitor your condition, and you may not need treatment. Follow these instructions at home:  Follow  instructions from your health care provider about any changes to your diet.  Take over-the-counter and prescription medicines only as told by your health care provider. These include supplements.  Do not use any products that contain nicotine or tobacco, such as cigarettes and e-cigarettes. If you need help quitting, ask your health care provider.  Keep all follow-up visits as told by your health care provider. This is important. Contact a health care provider if:  Your symptoms do not get better with treatment.  You have nausea, vomiting, or diarrhea. Get help right away if:  You have sudden, unexplained confusion or other mental changes.  You have a fever.  You have chest pain.  You have trouble breathing or swallowing.  You suddenly become very weak.  You experience extreme restlessness.  You feel your heart racing. Summary  A goiter is an enlarged thyroid gland.  The thyroid gland is located in the lower front of the neck. It makes hormones that affect many body parts and systems, including the system that affects how quickly the body burns fuel for energy (metabolism).  The main symptom of this condition is swelling in the lower, front part of the neck. This swelling can range from a very small bump to a large lump.  Treatment for this condition depends on the cause and your symptoms. You may need medicines, supplements, or regular monitoring of your condition. This information is not intended to replace advice given to you by your health care provider. Make sure you discuss any questions you have with your health care provider. Document Revised: 11/29/2019 Document Reviewed: 11/29/2019 Elsevier Patient Education  2021 [employer]  

## 2020-09-05 NOTE — Progress Notes (Signed)
Established Patient Office Visit  Subjective:  Patient ID: Juan Johnston, male    DOB: [birth year]  Age: 70 y.o. MRN: [id number]  CC:  Chief Complaint  Patient presents with  . Medication Refill    HPI Juan Johnston is a 70 year old male who presents for medication refills.  He voices no complaints or concerns at this time.  Blood pressure is elevated-he. denies shortness of breath, headaches, chest pain or lower extremity edema  Past Medical History:  Diagnosis Date  . HIV infection ([place])   . Thyroid goiter     Past Surgical History:  Procedure Laterality Date  . INTRAVASCULAR ULTRASOUND/IVUS N/A 07/21/2017   Procedure: Intravascular Ultrasound/IVUS;  Surgeon: Juan Sandy, MD;  Location: [facility];  Service: Cardiovascular;  Laterality: N/A;  . PERIPHERAL VASCULAR THROMBECTOMY N/A 07/20/2017   Procedure: PERIPHERAL VASCULAR THROMBECTOMY;  Surgeon: Juan Mitchell, MD;  Location: [facility];  Service: Cardiovascular;  Laterality: N/A;  . PERIPHERAL VASCULAR THROMBECTOMY N/A 07/21/2017   Procedure: PERIPHERAL VASCULAR THROMBECTOMY - LYSIS RECHECK;  Surgeon: Juan Sandy, MD;  Location: [facility];  Service: Cardiovascular;  Laterality: N/A;  . scrotum cyst      Family History  Problem Relation Age of Onset  . Hypertension Mother     Social History   Socioeconomic History  . Marital status: Single    Spouse name: Not on file  . Number of children: Not on file  . Years of education: Not on file  . Highest education level: Not on file  Occupational History  . Not on file  Tobacco Use  . Smoking status: Current Some Day Smoker    Types: Cigarettes    Last attempt to quit: 02/03/2017    Years since quitting: 3.5  . Smokeless tobacco: Never Used  . Tobacco comment: 1 pk lasts 2 days. "Does not inhale"  Vaping Use  . Vaping Use: Never used  Substance and Sexual Activity  . Alcohol use: Yes  . Drug use: No  .  Sexual activity: Yes    Partners: Male    Birth control/protection: None  Other Topics Concern  . Not on file  Social History Narrative  . Not on file   Social Determinants of Health   Financial Resource Strain: Not on file  Food Insecurity: Not on file  Transportation Needs: Not on file  Physical Activity: Not on file  Stress: Not on file  Social Connections: Not on file  Intimate Partner Violence: Not on file    Outpatient Medications Prior to Visit  Medication Sig Dispense Refill  . aspirin 81 MG chewable tablet Chew 1 tablet (81 mg total) by mouth daily. 30 tablet 0  . bictegravir-emtricitabine-tenofovir AF (BIKTARVY) 50-200-25 MG TABS tablet Take 1 tablet by mouth daily. 30 tablet 5  . atorvastatin (LIPITOR) 40 MG tablet TAKE 1 TABLET(40 MG) BY MOUTH DAILY 90 tablet 2   No facility-administered medications prior to visit.    No Known Allergies  ROS Review of Systems  All other systems reviewed and are negative.     Objective:    BP (!) 147/81 (BP Location: Right Arm, Patient Position: Sitting, Cuff Size: Normal)   Pulse 68   Temp (!) 97.5 F (36.4 C) (Temporal)   Ht 5\' 8"  (1.727 m)   Wt 181 lb 6.4 oz (82.3 kg)   SpO2 94%   BMI 27.58 kg/m  Wt Readings from Last 3 Encounters:  09/05/20 181 lb 6.4  oz (82.3 kg)  07/02/20 173 lb (78.5 kg)  11/19/19 172 lb (78 kg)   General: No apparent distress. Eyes: Extraocular eye movements intact, pupils equal and round. Neck: Supple, trachea midline. Thyroid: No enlargement, mobile without fixation, no tenderness. Cardiovascular: Regular rhythm and rate, no murmur, normal radial pulses. Respiratory: Normal respiratory effort, clear to auscultation. Gastrointestinal: Normal pitch active bowel sounds, nontender abdomen without distention or appreciable hepatomegaly. Neurologic:  deep tendon reflexes + Musculoskeletal: Normal muscle tone, no tenderness on palpation of tibia, no excessive thoracic kyphosis. Skin:  Appropriate warmth, no visible rash. Mental status: Alert, conversant, speech clear, thought logical, appropriate mood and affect, no hallucinations or delusions evident. Hematologic/lymphatic: No cervical adenopathy, no visible ecchymoses.  Health Maintenance Due  Topic Date Due  . Pneumococcal Vaccine 31-1 Years old (1 of 4 - PCV13) Never done  . FOOT EXAM  Never done  . OPHTHALMOLOGY EXAM  Never done  . Zoster Vaccines- Shingrix (1 of 2) Never done  . COVID-19 Vaccine (3 - Moderna risk 4-dose series) 08/16/2019  . Fecal DNA (Cologuard)  03/08/2020    There are no preventive care reminders to display for this patient.  Lab Results  Component Value Date   TSH 1.590 09/05/2020   Lab Results  Component Value Date   WBC 5.4 06/17/2020   HGB 15.8 06/17/2020   HCT 46.0 06/17/2020   MCV 96.4 06/17/2020   PLT 187 06/17/2020   Lab Results  Component Value Date   NA 140 06/17/2020   K 4.0 06/17/2020   CO2 24 06/17/2020   GLUCOSE 81 06/17/2020   BUN 14 06/17/2020   CREATININE 1.17 06/17/2020   BILITOT 0.7 06/17/2020   ALKPHOS 62 11/10/2017   AST 29 06/17/2020   ALT 54 (H) 06/17/2020   PROT 6.7 06/17/2020   ALBUMIN 4.2 11/10/2017   CALCIUM 9.9 06/17/2020   ANIONGAP 8 07/23/2017   Lab Results  Component Value Date   CHOL 225 (H) 09/05/2020   Lab Results  Component Value Date   HDL 49 09/05/2020   Lab Results  Component Value Date   LDLCALC 142 (H) 09/05/2020   Lab Results  Component Value Date   TRIG 191 (H) 09/05/2020   Lab Results  Component Value Date   CHOLHDL 4.6 09/05/2020   Lab Results  Component Value Date   HGBA1C 6.5 (A) 09/05/2020      Assessment & Plan:  [name] was seen today for medication refill.  Diagnoses and all orders for this visit:  Prediabetes -     HgB A1c 6.5  Prediabetic Patients:   - AVOID Animal products, ie. Meat - red/white, Poultry and Dairy/especially cheese - Exercise at least 5 times a week for 30 minutes or  preferably daily.  - No Smoking - Drink less than 2 drinks a day.  - Monitor your feet for sores - Have yearly Eye Exams - Recommend annual Flu vaccine  - Recommend Pneumovax and Prevnar vaccines - Shingles Vaccine (Zostavax) if over 47 y.o. Goals:  - BMI less than 24 - Fasting sugar less than 130 or less than 150 if tapering medicines to lose weight  - Systolic BP less than 376  - Diastolic BP less than 80 - Bad LDL Cholesterol less than 70 - Triglycerides less than 150 -     Microalbumin, urine  Onychomycosis -     Ambulatory referral to Podiatry  Colon cancer screening -     Fecal occult blood, imunochemical; Future  Mixed  hyperlipidemia Decrease your fatty foods, red meat, cheese, milk and increase fiber like whole grains and veggies.  -     atorvastatin (LIPITOR) 40 MG tablet; TAKE 1 TABLET(40 MG) BY MOUTH DAILY -     Lipid Panel  Goiter -     TSH + free T4  Other orders -     lisinopril (ZESTRIL) 20 MG tablet; Take 1 tablet (20 mg total) by mouth daily.    Meds ordered this encounter  Medications  . atorvastatin (LIPITOR) 40 MG tablet    Sig: TAKE 1 TABLET(40 MG) BY MOUTH DAILY    Dispense:  90 tablet    Refill:  1  . lisinopril (ZESTRIL) 20 MG tablet    Sig: Take 1 tablet (20 mg total) by mouth daily.    Dispense:  90 tablet    Refill:  1    Follow-up: Return in about 2 months (around 11/05/2020) for BP ck start on lisinopril.    Kerin Perna, NP

## 2020-09-06 LAB — LIPID PANEL
Chol/HDL Ratio: 4.6 ratio (ref 0.0–5.0)
Cholesterol, Total: 225 mg/dL — ABNORMAL HIGH (ref 100–199)
HDL: 49 mg/dL (ref >39)
LDL Chol Calc (NIH): 142 mg/dL — ABNORMAL HIGH (ref 0–99)
Triglycerides: 191 mg/dL — ABNORMAL HIGH (ref 0–149)
VLDL Cholesterol Cal: 34 mg/dL (ref 5–40)

## 2020-09-06 LAB — TSH+FREE T4
Free T4: 1.24 ng/dL (ref 0.82–1.77)
TSH: 1.59 u[IU]/mL (ref 0.450–4.500)

## 2020-09-06 LAB — MICROALBUMIN, URINE: Microalbumin, Urine: 50.6 ug/mL

## 2020-11-10 ENCOUNTER — Ambulatory Visit (INDEPENDENT_AMBULATORY_CARE_PROVIDER_SITE_OTHER): Payer: Medicare (Managed Care) | Admitting: Nurse Practitioner

## 2020-11-10 ENCOUNTER — Other Ambulatory Visit: Payer: Self-pay

## 2020-11-10 ENCOUNTER — Ambulatory Visit (INDEPENDENT_AMBULATORY_CARE_PROVIDER_SITE_OTHER): Payer: Medicare (Managed Care) | Admitting: Primary Care

## 2020-11-10 ENCOUNTER — Encounter (INDEPENDENT_AMBULATORY_CARE_PROVIDER_SITE_OTHER): Payer: Self-pay | Admitting: Nurse Practitioner

## 2020-11-10 VITALS — BP 149/80 | HR 67 | Temp 97.5°F | Ht 68.0 in | Wt 183.6 lb

## 2020-11-10 DIAGNOSIS — I10 Essential (primary) hypertension: Secondary | ICD-10-CM | POA: Insufficient documentation

## 2020-11-10 NOTE — Progress Notes (Signed)
$'@Patient'u$  ID: Juan Johnston, male    DOB: Jun 04, 1950, 70 y.o.   MRN: [id number]  Chief Complaint  Patient presents with   Blood Pressure Check    Referring provider: Kerin Perna, NP  HPI  Patient presents today for follow-up visit.  Patient was last seen in this office by Juluis Mire.  He was started on lisinopril at his last visit.  Patient has been compliant with medication.  He has been checking his blood pressures at home and states that his systolic blood pressure has been between 130-140.  Patient's blood pressure was still slightly elevated in the office today.  We discussed healthy diet and exercise.  Patient does not want to start on new blood pressure medicine time.  We discussed that we will do a close follow-up to recheck his blood pressure in around a month. Denies f/c/s, n/v/d, hemoptysis, PND, chest pain or edema.    No Known Allergies  Immunization History  Administered Date(s) Administered   Fluad Quad(high Dose 65+) 04/14/2020   Influenza,inj,Quad PF,6+ Mos 05/19/2017, 04/20/2018, 11/30/2018   Moderna Sars-Covid-2 Vaccination 06/04/2019, 07/19/2019   PPD Test 11/19/2019   Pneumococcal Conjugate-13 10/24/2017   Pneumococcal Polysaccharide-23 01/17/2018   Tdap 11/30/2018    Past Medical History:  Diagnosis Date   HIV infection ([place])    Thyroid goiter     Tobacco History: Social History   Tobacco Use  Smoking Status Some Days   Types: Cigarettes   Last attempt to quit: 02/03/2017   Years since quitting: 3.7  Smokeless Tobacco Never  Tobacco Comments   1 pk lasts 2 days. "Does not inhale"   Ready to quit: Not Answered Counseling given: Not Answered Tobacco comments: 1 pk lasts 2 days. "Does not inhale"   Outpatient Encounter Medications as of 11/10/2020  Medication Sig   aspirin 81 MG chewable tablet Chew 1 tablet (81 mg total) by mouth daily.   atorvastatin (LIPITOR) 40 MG tablet TAKE 1 TABLET(40 MG) BY MOUTH DAILY    bictegravir-emtricitabine-tenofovir AF (BIKTARVY) 50-200-25 MG TABS tablet Take 1 tablet by mouth daily.   lisinopril (ZESTRIL) 20 MG tablet Take 1 tablet (20 mg total) by mouth daily.   No facility-administered encounter medications on file as of 11/10/2020.     Review of Systems  Review of Systems  Constitutional: Negative.  Negative for fatigue and fever.  HENT: Negative.    Respiratory:  Negative for cough and shortness of breath.   Cardiovascular: Negative.   Gastrointestinal: Negative.   Allergic/Immunologic: Negative.   Neurological: Negative.   Psychiatric/Behavioral: Negative.        Physical Exam  BP (!) 149/80 (BP Location: Right Arm, Patient Position: Sitting, Cuff Size: Normal)   Pulse 67   Temp (!) 97.5 F (36.4 C) (Temporal)   Ht '5\' 8"'$  (1.727 m)   Wt 183 lb 9.6 oz (83.3 kg)   SpO2 95%   BMI 27.92 kg/m   Wt Readings from Last 5 Encounters:  11/10/20 183 lb 9.6 oz (83.3 kg)  09/05/20 181 lb 6.4 oz (82.3 kg)  07/02/20 173 lb (78.5 kg)  11/19/19 172 lb (78 kg)  11/19/19 172 lb (78 kg)     Physical Exam Vitals and nursing note reviewed.  Constitutional:      General: He is not in acute distress.    Appearance: He is well-developed.  Cardiovascular:     Rate and Rhythm: Normal rate and regular rhythm.  Pulmonary:     Effort: Pulmonary effort is normal.  Breath sounds: Normal breath sounds.  Musculoskeletal:     Right lower leg: No edema.     Left lower leg: No edema.  Skin:    General: Skin is warm and dry.  Neurological:     Mental Status: He is alert and oriented to person, place, and time.     Assessment & Plan:   Essential hypertension Continue lisinopril 20 mg daily  Low sodium diet  May start daily exercise routine  Follow up:  Follow up with Juluis Mire in 1 month or sooner if needed     Fenton Foy, NP 11/10/2020

## 2020-11-10 NOTE — Patient Instructions (Addendum)
Hypertension:  Continue lisinopril 20 mg daily  Low sodium diet  May start daily exercise routine  Follow up:  Follow up with Juluis Mire in 1 month or sooner if needed   Hypertension, Adult Hypertension is another name for high blood pressure. High blood pressure forces your heart to work harder to pump blood. This can cause problems overtime. There are two numbers in a blood pressure reading. There is a top number (systolic) over a bottom number (diastolic). It is best to have a blood pressure that is below 120/80. Healthy choicescan help lower your blood pressure, or you may need medicine to help lower it. What are the causes? The cause of this condition is not known. Some conditions may be related tohigh blood pressure. What increases the risk? Smoking. Having type 2 diabetes mellitus, high cholesterol, or both. Not getting enough exercise or physical activity. Being overweight. Having too much fat, sugar, calories, or salt (sodium) in your diet. Drinking too much alcohol. Having long-term (chronic) kidney disease. Having a family history of high blood pressure. Age. Risk increases with age. Race. You may be at higher risk if you are African American. Gender. Men are at higher risk than women before age 82. After age 14, women are at higher risk than men. Having obstructive sleep apnea. Stress. What are the signs or symptoms? High blood pressure may not cause symptoms. Very high blood pressure (hypertensive crisis) may cause: Headache. Feelings of worry or nervousness (anxiety). Shortness of breath. Nosebleed. A feeling of being sick to your stomach (nausea). Throwing up (vomiting). Changes in how you see. Very bad chest pain. Seizures. How is this treated? This condition is treated by making healthy lifestyle changes, such as: Eating healthy foods. Exercising more. Drinking less alcohol. Your health care provider may prescribe medicine if lifestyle changes  are not enough to get your blood pressure under control, and if: Your top number is above 130. Your bottom number is above 80. Your personal target blood pressure may vary. Follow these instructions at home: Eating and drinking  If told, follow the DASH eating plan. To follow this plan: Fill one half of your plate at each meal with fruits and vegetables. Fill one fourth of your plate at each meal with whole grains. Whole grains include whole-wheat pasta, brown rice, and whole-grain bread. Eat or drink low-fat dairy products, such as skim milk or low-fat yogurt. Fill one fourth of your plate at each meal with low-fat (lean) proteins. Low-fat proteins include fish, chicken without skin, eggs, beans, and tofu. Avoid fatty meat, cured and processed meat, or chicken with skin. Avoid pre-made or processed food. Eat less than 1,500 mg of salt each day. Do not drink alcohol if: Your doctor tells you not to drink. You are pregnant, may be pregnant, or are planning to become pregnant. If you drink alcohol: Limit how much you use to: 0-1 drink a day for women. 0-2 drinks a day for men. Be aware of how much alcohol is in your drink. In the [place] one drink equals one 12 oz bottle of beer (355 mL), one 5 oz glass of wine (148 mL), or one 1 oz glass of hard liquor (44 mL).  Lifestyle  Work with your doctor to stay at a healthy weight or to lose weight. Ask your doctor what the best weight is for you. Get at least 30 minutes of exercise most days of the week. This may include walking, swimming, or biking. Get at least 30 minutes  of exercise that strengthens your muscles (resistance exercise) at least 3 days a week. This may include lifting weights or doing Pilates. Do not use any products that contain nicotine or tobacco, such as cigarettes, e-cigarettes, and chewing tobacco. If you need help quitting, ask your doctor. Check your blood pressure at home as told by your doctor. Keep all follow-up  visits as told by your doctor. This is important.  Medicines Take over-the-counter and prescription medicines only as told by your doctor. Follow directions carefully. Do not skip doses of blood pressure medicine. The medicine does not work as well if you skip doses. Skipping doses also puts you at risk for problems. Ask your doctor about side effects or reactions to medicines that you should watch for. Contact a doctor if you: Think you are having a reaction to the medicine you are taking. Have headaches that keep coming back (recurring). Feel dizzy. Have swelling in your ankles. Have trouble with your vision. Get help right away if you: Get a very bad headache. Start to feel mixed up (confused). Feel weak or numb. Feel faint. Have very bad pain in your: Chest. Belly (abdomen). Throw up more than once. Have trouble breathing. Summary Hypertension is another name for high blood pressure. High blood pressure forces your heart to work harder to pump blood. For most people, a normal blood pressure is less than 120/80. Making healthy choices can help lower blood pressure. If your blood pressure does not get lower with healthy choices, you may need to take medicine. This information is not intended to replace advice given to you by your health care provider. Make sure you discuss any questions you have with your healthcare provider. Document Revised: 11/30/2017 Document Reviewed: 11/30/2017 Elsevier Patient Education  [place].

## 2020-11-10 NOTE — Assessment & Plan Note (Signed)
Continue lisinopril 20 mg daily  Low sodium diet  May start daily exercise routine  Follow up:  Follow up with Juluis Mire in 1 month or sooner if needed

## 2020-11-26 ENCOUNTER — Ambulatory Visit (INDEPENDENT_AMBULATORY_CARE_PROVIDER_SITE_OTHER): Payer: Medicare (Managed Care)

## 2020-11-26 ENCOUNTER — Other Ambulatory Visit: Payer: Self-pay

## 2020-11-26 DIAGNOSIS — Z111 Encounter for screening for respiratory tuberculosis: Secondary | ICD-10-CM

## 2020-11-28 ENCOUNTER — Other Ambulatory Visit (INDEPENDENT_AMBULATORY_CARE_PROVIDER_SITE_OTHER): Payer: Self-pay | Admitting: Primary Care

## 2020-11-28 ENCOUNTER — Ambulatory Visit (INDEPENDENT_AMBULATORY_CARE_PROVIDER_SITE_OTHER): Payer: Medicare (Managed Care)

## 2020-11-28 ENCOUNTER — Other Ambulatory Visit: Payer: Self-pay

## 2020-11-28 ENCOUNTER — Ambulatory Visit (HOSPITAL_COMMUNITY)
Admission: RE | Admit: 2020-11-28 | Discharge: 2020-11-28 | Disposition: A | Discharge status: Home / Self Care | Payer: Medicare (Managed Care) | Source: Ambulatory Visit | Attending: Primary Care | Admitting: Primary Care

## 2020-11-28 DIAGNOSIS — R7611 Nonspecific reaction to tuberculin skin test without active tuberculosis: Secondary | ICD-10-CM

## 2020-11-28 DIAGNOSIS — Z111 Encounter for screening for respiratory tuberculosis: Secondary | ICD-10-CM

## 2020-11-28 LAB — TB SKIN TEST
Induration: 20 mm
TB Skin Test: POSITIVE

## 2020-11-28 NOTE — Progress Notes (Signed)
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 Patient has had positive test before. Needs order for chest x-ray. Nat Christen, CMA

## 2020-11-29 ENCOUNTER — Encounter (INDEPENDENT_AMBULATORY_CARE_PROVIDER_SITE_OTHER): Payer: Self-pay

## 2020-11-29 ENCOUNTER — Ambulatory Visit (INDEPENDENT_AMBULATORY_CARE_PROVIDER_SITE_OTHER): Payer: Medicare (Managed Care)

## 2020-11-29 DIAGNOSIS — Z0000 Encounter for general adult medical examination without abnormal findings: Secondary | ICD-10-CM

## 2020-11-29 NOTE — Patient Instructions (Signed)
Health Maintenance, Male Adopting a healthy lifestyle and getting preventive care are important in promoting health and wellness. Ask your health care provider about: The right schedule for you to have regular tests and exams. Things you can do on your own to prevent diseases and keep yourself healthy. What should I know about diet, weight, and exercise? Eat a healthy diet  Eat a diet that includes plenty of vegetables, fruits, low-fat dairy products, and lean protein. Do not eat a lot of foods that are high in solid fats, added sugars, or sodium.  Maintain a healthy weight Body mass index (BMI) is a measurement that can be used to identify possible weight problems. It estimates body fat based on height and weight. Your health care provider can help determine your BMI and help you achieve or maintain ahealthy weight. Get regular exercise Get regular exercise. This is one of the most important things you can do for your health. Most adults should: Exercise for at least 150 minutes each week. The exercise should increase your heart rate and make you sweat (moderate-intensity exercise). Do strengthening exercises at least twice a week. This is in addition to the moderate-intensity exercise. Spend less time sitting. Even light physical activity can be beneficial. Watch cholesterol and blood lipids Have your blood tested for lipids and cholesterol at 70 years of age, then havethis test every 5 years. You may need to have your cholesterol levels checked more often if: Your lipid or cholesterol levels are high. You are older than 70 years of age. You are at high risk for heart disease. What should I know about cancer screening? Many types of cancers can be detected early and may often be prevented. Depending on your health history and family history, you may need to have cancer screening at various ages. This may include screening for: Colorectal cancer. Prostate cancer. Skin cancer. Lung  cancer. What should I know about heart disease, diabetes, and high blood pressure? Blood pressure and heart disease High blood pressure causes heart disease and increases the risk of stroke. This is more likely to develop in people who have high blood pressure readings, are of African descent, or are overweight. Talk with your health care provider about your target blood pressure readings. Have your blood pressure checked: Every 3-5 years if you are 18-39 years of age. Every year if you are 40 years old or older. If you are between the ages of 65 and 75 and are a current or former smoker, ask your health care provider if you should have a one-time screening for abdominal aortic aneurysm (AAA). Diabetes Have regular diabetes screenings. This checks your fasting blood sugar level. Have the screening done: Once every three years after age 45 if you are at a normal weight and have a low risk for diabetes. More often and at a younger age if you are overweight or have a high risk for diabetes. What should I know about preventing infection? Hepatitis B If you have a higher risk for hepatitis B, you should be screened for this virus. Talk with your health care provider to find out if you are at risk forhepatitis B infection. Hepatitis C Blood testing is recommended for: Everyone born from 1945 through 1965. Anyone with known risk factors for hepatitis C. Sexually transmitted infections (STIs) You should be screened each year for STIs, including gonorrhea and chlamydia, if: You are sexually active and are younger than 70 years of age. You are older than 70 years of age   and your health care provider tells you that you are at risk for this type of infection. Your sexual activity has changed since you were last screened, and you are at increased risk for chlamydia or gonorrhea. Ask your health care provider if you are at risk. Ask your health care provider about whether you are at high risk for HIV.  Your health care provider may recommend a prescription medicine to help prevent HIV infection. If you choose to take medicine to prevent HIV, you should first get tested for HIV. You should then be tested every 3 months for as long as you are taking the medicine. Follow these instructions at home: Lifestyle Do not use any products that contain nicotine or tobacco, such as cigarettes, e-cigarettes, and chewing tobacco. If you need help quitting, ask your health care provider. Do not use street drugs. Do not share needles. Ask your health care provider for help if you need support or information about quitting drugs. Alcohol use Do not drink alcohol if your health care provider tells you not to drink. If you drink alcohol: Limit how much you have to 0-2 drinks a day. Be aware of how much alcohol is in your drink. In the [place] one drink equals one 12 oz bottle of beer (355 mL), one 5 oz glass of wine (148 mL), or one 1 oz glass of hard liquor (44 mL). General instructions Schedule regular health, dental, and eye exams. Stay current with your vaccines. Tell your health care provider if: You often feel depressed. You have ever been abused or do not feel safe at home. Summary Adopting a healthy lifestyle and getting preventive care are important in promoting health and wellness. Follow your health care provider's instructions about healthy diet, exercising, and getting tested or screened for diseases. Follow your health care provider's instructions on monitoring your cholesterol and blood pressure. This information is not intended to replace advice given to you by your health care provider. Make sure you discuss any questions you have with your healthcare provider. Document Revised: 03/15/2018 Document Reviewed: 03/15/2018 Elsevier Patient Education  2022 [employer]  

## 2020-11-29 NOTE — Progress Notes (Addendum)
Subjective:   Juan Johnston is a 70 y.o. male who presents for an Initial Medicare Annual Wellness Visit.  I connected with  Stevie Kern on 11/29/20 by a audio enabled telemedicine application and verified that I am speaking with the correct person using two identifiers.   I discussed the limitations of evaluation and management by telemedicine. The patient expressed understanding and agreed to proceed. Location patient: home Location provider: work Persons participating in the virtual visit: patient, CMA     Review of Systems    Defer to PCP Cardiac Risk Factors include: advanced age (>75mn, >>68women)     Objective:    Today's Vitals   11/29/20 1115  PainSc: 0-No pain   There is no height or weight on file to calculate BMI.  Advanced Directives 11/29/2020 07/07/2018 10/10/2017 08/15/2017 07/20/2017 02/26/2017 02/13/2017  Does Patient Have a Medical Advance Directive? No No No No No No No  Would patient like information on creating a medical advance directive? No - Patient declined No - Patient declined - - No - Patient declined No - Patient declined No - Patient declined    Current Medications (verified) Outpatient Encounter Medications as of 11/29/2020  Medication Sig   aspirin 81 MG chewable tablet Chew 1 tablet (81 mg total) by mouth daily.   atorvastatin (LIPITOR) 40 MG tablet TAKE 1 TABLET(40 MG) BY MOUTH DAILY   bictegravir-emtricitabine-tenofovir AF (BIKTARVY) 50-200-25 MG TABS tablet Take 1 tablet by mouth daily.   lisinopril (ZESTRIL) 20 MG tablet Take 1 tablet (20 mg total) by mouth daily.   No facility-administered encounter medications on file as of 11/29/2020.    Allergies (verified) Patient has no known allergies.   History: Past Medical History:  Diagnosis Date   HIV infection ([place]    Thyroid goiter    Past Surgical History:  Procedure Laterality Date   INTRAVASCULAR ULTRASOUND/IVUS N/A 07/21/2017   Procedure: Intravascular Ultrasound/IVUS;   Surgeon: CWaynetta Sandy MD;  [facility];  Service: Cardiovascular;  Laterality: N/A;   PERIPHERAL VASCULAR THROMBECTOMY N/A 07/20/2017   Procedure: PERIPHERAL VASCULAR THROMBECTOMY;  Surgeon: BSerafina Mitchell MD;  [facility];  Service: Cardiovascular;  Laterality: N/A;   PERIPHERAL VASCULAR THROMBECTOMY N/A 07/21/2017   Procedure: PERIPHERAL VASCULAR THROMBECTOMY - LYSIS RECHECK;  Surgeon: CWaynetta Sandy MD;  [facility];  Service: Cardiovascular;  Laterality: N/A;   scrotum cyst     Family History  Problem Relation Age of Onset   Hypertension Mother    Social History   Socioeconomic History   Marital status: Single    Spouse name: Not on file   Number of children: Not on file   Years of education: Not on file   Highest education level: Not on file  Occupational History   Not on file  Tobacco Use   Smoking status: Some Days    Types: Cigarettes    Last attempt to quit: 02/03/2017    Years since quitting: 3.8   Smokeless tobacco: Never   Tobacco comments:    1 pk lasts 2 days. "Does not inhale"  Vaping Use   Vaping Use: Never used  Substance and Sexual Activity   Alcohol use: Yes   Drug use: No   Sexual activity: Yes    Partners: Male    Birth control/protection: None  Other Topics Concern   Not on file  Social History Narrative   Not on file   Social Determinants of  Health   Financial Resource Strain: Low Risk    Difficulty of Paying Living Expenses: Not hard at all  Food Insecurity: No Food Insecurity   Worried About [place] in the Last Year: Never true   Ran Out of Food in the Last Year: Never true  Transportation Needs: No Transportation Needs   Lack of Transportation (Medical): No   Lack of Transportation (Non-Medical): No  Physical Activity: Sufficiently Active   Days of Exercise per Week: 3 days   Minutes of Exercise per Session: 60 min  Stress: No Stress Concern Present    Feeling of Stress : Not at all  Social Connections: Moderately Isolated   Frequency of Communication with Friends and Family: More than three times a week   Frequency of Social Gatherings with Friends and Family: More than three times a week   Attends Religious Services: 1 to 4 times per year   Active Member of [employer] or Organizations: No   Attends Archivist Meetings: Never   Marital Status: Widowed    Tobacco Counseling Ready to quit: No Counseling given: Not Answered Tobacco comments: 1 pk lasts 2 days. "Does not inhale"   Clinical Intake:  Pre-visit preparation completed: Yes  Pain : No/denies pain Pain Score: 0-No pain     Diabetes: Yes CBG done?: No Did pt. bring in CBG monitor from home?: No  How often do you need to have someone help you when you read instructions, pamphlets, or other written materials from your doctor or pharmacy?: 1 - Never What is the last grade level you completed in school?: 12th Grade.  Diabetic? Yes  Interpreter Needed?: No      Activities of Daily Living In your present state of health, do you have any difficulty performing the following activities: 11/29/2020  Hearing? N  Vision? N  Difficulty concentrating or making decisions? N  Walking or climbing stairs? N  Dressing or bathing? N  Doing errands, shopping? N  Preparing Food and eating ? N  Using the Toilet? N  In the past six months, have you accidently leaked urine? N  Do you have problems with loss of bowel control? N  Managing your Medications? N  Managing your Finances? N  Housekeeping or managing your Housekeeping? N  Some recent data might be hidden    Patient Care Team: Kerin Perna, NP as PCP - General (Internal Medicine)  Indicate any recent Medical Services you may have received from other than [facility] providers in the past year (date may be approximate).     Assessment:   This is a routine wellness examination for Juan Johnston.  Hearing/Vision  screen No results found.  Dietary issues and exercise activities discussed: Current Exercise Habits: Home exercise routine, Type of exercise: walking;Other - see comments (Yard work), Time (Minutes): 60, Frequency (Times/Week): 3, Weekly Exercise (Minutes/Week): 180, Intensity: Mild   Goals Addressed   None   Depression Screen PHQ 2/9 Scores 11/29/2020 11/10/2020 09/05/2020 07/02/2020 03/04/2020 11/19/2019 11/19/2019  PHQ - 2 Score 0 0 0 0 0 0 0  PHQ- 9 Score - - - - - - -    Fall Risk Fall Risk  11/29/2020 11/10/2020 09/05/2020 07/02/2020 03/04/2020  Falls in the past year? 0 0 0 0 0  Number falls in past yr: 0 - - - 0  Injury with Fall? 0 - - - 0  Risk for fall due to : No Fall Risks - - - -  Follow up Falls  evaluation completed - - Falls evaluation completed -    FALL RISK PREVENTION PERTAINING TO THE HOME:  Any stairs in or around the home? Yes  If so, are there any without handrails? Yes  Home free of loose throw rugs in walkways, pet beds, electrical cords, etc? No  Adequate lighting in your home to reduce risk of falls? Yes   ASSISTIVE DEVICES UTILIZED TO PREVENT FALLS:  Life alert? No  Use of a cane, walker or w/c? No  Grab bars in the bathroom? No  Shower chair or bench in shower? No  Elevated toilet seat or a handicapped toilet? No   TIMED UP AND GO:  Was the test performed? No .    Cognitive Function:     6CIT Screen 11/29/2020  What Year? 0 points  What month? 0 points  What time? 0 points  Count back from 20 0 points  Months in reverse 0 points  Repeat phrase 10 points  Total Score 10    Immunizations Immunization History  Administered Date(s) Administered   Fluad Quad(high Dose 65+) 04/14/2020   Influenza,inj,Quad PF,6+ Mos 05/19/2017, 04/20/2018, 11/30/2018   PFIZER Comirnaty(Gray Top)Covid-19 Tri-Sucrose Vaccine 05/15/2020   PFIZER(Purple Top)SARS-COV-2 Vaccination 06/21/2019, 07/14/2019, 11/21/2019   PPD Test 11/19/2019, 11/26/2020   Pneumococcal  Conjugate-13 10/24/2017   Pneumococcal Polysaccharide-23 01/17/2018   Tdap 11/30/2018    TDAP status: Up to date  Flu Vaccine status: Due, Education has been provided regarding the importance of this vaccine. Advised may receive this vaccine at local pharmacy or Health Dept. Aware to provide a copy of the vaccination record if obtained from local pharmacy or Health Dept. Verbalized acceptance and understanding.  Pneumococcal vaccine status: Up to date  Covid-19 vaccine status: Completed vaccines  Qualifies for Shingles Vaccine? Yes   Zostavax completed No   Shingrix Completed?: No.    Education has been provided regarding the importance of this vaccine. Patient has been advised to call insurance company to determine out of pocket expense if they have not yet received this vaccine. Advised may also receive vaccine at local pharmacy or Health Dept. Verbalized acceptance and understanding.  Screening Tests Health Maintenance  Topic Date Due   FOOT EXAM  Never done   OPHTHALMOLOGY EXAM  Never done   Fecal DNA (Cologuard)  03/08/2020   COVID-19 Vaccine (5 - Booster for Pfizer series) 09/12/2020   INFLUENZA VACCINE  11/03/2020   Zoster Vaccines- Shingrix (1 of 2) 02/10/2021 (Originally 08/04/1969)   HEMOGLOBIN A1C  03/07/2021   TETANUS/TDAP  11/29/2028   Hepatitis C Screening  Completed   PNA vac Low Risk Adult  Completed   HPV VACCINES  Aged Out    Health Maintenance  Health Maintenance Due  Topic Date Due   FOOT EXAM  Never done   OPHTHALMOLOGY EXAM  Never done   Fecal DNA (Cologuard)  03/08/2020   COVID-19 Vaccine (5 - Booster for Pfizer series) 09/12/2020   INFLUENZA VACCINE  11/03/2020    Colorectal cancer screening: Type of screening: Cologuard. Completed 03/08/20. Repeat every 1 years  Lung Cancer Screening: (Low Dose CT Chest recommended if Age 60-80 years, 30 pack-year currently smoking OR have quit w/in 15years.) does qualify.   Lung Cancer Screening Referral:  No  Additional Screening:  Hepatitis C Screening: does qualify; Completed 04/06/2017  Vision Screening: Recommended annual ophthalmology exams for early detection of glaucoma and other disorders of the eye. Is the patient up to date with their annual eye exam?  No  Who is the provider or what is the name of the office in which the patient attends annual eye exams? N/A If pt is not established with a provider, would they like to be referred to a provider to establish care?  Patient will look into an eye doctor to see yearly .   Dental Screening: Recommended annual dental exams for proper oral hygiene  Community Resource Referral / Chronic Care Management: CRR required this visit?  No   CCM required this visit?  No      Plan:     I have personally reviewed and noted the following in the patient's chart:   Medical and social history Use of alcohol, tobacco or illicit drugs  Current medications and supplements including opioid prescriptions. Patient is not currently taking opioid prescriptions. Functional ability and status Nutritional status Physical activity Advanced directives List of other physicians Hospitalizations, surgeries, and ER visits in previous 12 months Vitals Screenings to include cognitive, depression, and falls Referrals and appointments  In addition, I have reviewed and discussed with patient certain preventive protocols, quality metrics, and best practice recommendations. A written personalized care plan for preventive services as well as general preventive health recommendations were provided to patient.     Thressa Sheller, [place]   11/29/2020   Nurse Notes: Non-face to face 23 minute visit.    Mr. Ricchio , Thank you for taking time to come for your Medicare Wellness Visit. I appreciate your ongoing commitment to your health goals. Please review the following plan we discussed and let me know if I can assist you in the future.   These are the goals we  discussed:  Goals   None     This is a list of the screening recommended for you and due dates:  Health Maintenance  Topic Date Due   Complete foot exam   Never done   Eye exam for diabetics  Never done   Cologuard (Stool DNA test)  03/08/2020   COVID-19 Vaccine (5 - Booster for Pfizer series) 09/12/2020   Flu Shot  11/03/2020   Zoster (Shingles) Vaccine (1 of 2) 02/10/2021*   Hemoglobin A1C  03/07/2021   Tetanus Vaccine  11/29/2028   Hepatitis C Screening: USPSTF Recommendation to screen - Ages 18-79 yo.  Completed   Pneumonia vaccines  Completed   HPV Vaccine  Aged Out  *Topic was postponed. The date shown is not the original due date.

## 2020-12-02 ENCOUNTER — Telehealth (INDEPENDENT_AMBULATORY_CARE_PROVIDER_SITE_OTHER): Payer: Self-pay

## 2020-12-02 ENCOUNTER — Encounter (INDEPENDENT_AMBULATORY_CARE_PROVIDER_SITE_OTHER): Payer: Self-pay

## 2020-12-02 NOTE — Telephone Encounter (Signed)
Patient aware that chest x-ray was negative for Tb. Letter at front for pick up. Nat Christen, CMA

## 2020-12-02 NOTE — Telephone Encounter (Signed)
-----   Message from Kerin Perna, NP sent at 12/01/2020  1:43 PM EDT ----- Negative for TB

## 2020-12-09 ENCOUNTER — Ambulatory Visit (INDEPENDENT_AMBULATORY_CARE_PROVIDER_SITE_OTHER): Payer: Medicare (Managed Care) | Admitting: Primary Care

## 2020-12-09 ENCOUNTER — Encounter (INDEPENDENT_AMBULATORY_CARE_PROVIDER_SITE_OTHER): Payer: Self-pay | Admitting: Primary Care

## 2020-12-09 ENCOUNTER — Ambulatory Visit: Payer: Medicare (Managed Care)

## 2020-12-09 ENCOUNTER — Other Ambulatory Visit: Payer: Self-pay

## 2020-12-09 VITALS — BP 134/75 | HR 79 | Temp 97.5°F | Ht 68.0 in | Wt 180.8 lb

## 2020-12-09 DIAGNOSIS — E782 Mixed hyperlipidemia: Secondary | ICD-10-CM | POA: Diagnosis not present

## 2020-12-09 DIAGNOSIS — Z23 Encounter for immunization: Secondary | ICD-10-CM

## 2020-12-09 DIAGNOSIS — I10 Essential (primary) hypertension: Secondary | ICD-10-CM | POA: Diagnosis not present

## 2020-12-09 DIAGNOSIS — L246 Irritant contact dermatitis due to food in contact with skin: Secondary | ICD-10-CM | POA: Diagnosis not present

## 2020-12-09 DIAGNOSIS — Z1211 Encounter for screening for malignant neoplasm of colon: Secondary | ICD-10-CM

## 2020-12-09 DIAGNOSIS — F17200 Nicotine dependence, unspecified, uncomplicated: Secondary | ICD-10-CM

## 2020-12-09 DIAGNOSIS — F17210 Nicotine dependence, cigarettes, uncomplicated: Secondary | ICD-10-CM

## 2020-12-09 MED ORDER — HYDROXYZINE HCL 10 MG PO TABS: 10.0000 mg | ORAL_TABLET | Freq: Three times a day (TID) | ORAL | 0 refills | Status: DC | PRN

## 2020-12-09 MED ORDER — LISINOPRIL 20 MG PO TABS: 20.0000 mg | ORAL_TABLET | Freq: Every day | ORAL | 1 refills | Status: DC

## 2020-12-09 MED ORDER — PREDNISONE 10 MG PO TABS: 10.0000 mg | ORAL_TABLET | Freq: Every day | ORAL | 0 refills | Status: DC

## 2020-12-09 NOTE — Progress Notes (Signed)
[place]   [name] is a 70 y.o. male presents for hypertension evaluation, Denies shortness of breath, headaches, chest pain or lower extremity edema, sudden onset, vision changes, unilateral weakness, dizziness, paresthesias . Patient has reduced beer and alcohol to once weekly. Continue to smoke tobacco no interest in stopping. He is concern about a rash he obtained after picking okra. He stated he looked it up and it was a okra rash.   Patient reports adherence with medications.  Dietary habits include: monitoring sodium intake and starches  Exercise habits include:yes - walking , yard  Family / Social history: No    Past Medical History:  Diagnosis Date   HIV infection ([place])    Thyroid goiter    Past Surgical History:  Procedure Laterality Date   INTRAVASCULAR ULTRASOUND/IVUS N/A 07/21/2017   Procedure: Intravascular Ultrasound/IVUS;  Surgeon: Waynetta Sandy, MD;  Location: [facility];  Service: Cardiovascular;  Laterality: N/A;   PERIPHERAL VASCULAR THROMBECTOMY N/A 07/20/2017   Procedure: PERIPHERAL VASCULAR THROMBECTOMY;  Surgeon: Serafina Mitchell, MD;  Location: [facility];  Service: Cardiovascular;  Laterality: N/A;   PERIPHERAL VASCULAR THROMBECTOMY N/A 07/21/2017   Procedure: PERIPHERAL VASCULAR THROMBECTOMY - LYSIS RECHECK;  Surgeon: Waynetta Sandy, MD;  Location: [facility];  Service: Cardiovascular;  Laterality: N/A;   scrotum cyst     No Known Allergies Current Outpatient Medications on File Prior to Visit  Medication Sig Dispense Refill   aspirin 81 MG chewable tablet Chew 1 tablet (81 mg total) by mouth daily. 30 tablet 0   atorvastatin (LIPITOR) 40 MG tablet TAKE 1 TABLET(40 MG) BY MOUTH DAILY 90 tablet 1   bictegravir-emtricitabine-tenofovir AF (BIKTARVY) 50-200-25 MG TABS tablet Take 1 tablet by mouth daily. 30 tablet 5   lisinopril (ZESTRIL) 20 MG tablet Take 1 tablet (20 mg total) by  mouth daily. 90 tablet 1   No current facility-administered medications on file prior to visit.   Social History   Socioeconomic History   Marital status: Single    Spouse name: Not on file   Number of children: Not on file   Years of education: Not on file   Highest education level: Not on file  Occupational History   Not on file  Tobacco Use   Smoking status: Some Days    Types: Cigarettes    Last attempt to quit: 02/03/2017    Years since quitting: 3.8   Smokeless tobacco: Never   Tobacco comments:    1 pk lasts 2 days. "Does not inhale"  Vaping Use   Vaping Use: Never used  Substance and Sexual Activity   Alcohol use: Yes   Drug use: No   Sexual activity: Yes    Partners: Male    Birth control/protection: None  Other Topics Concern   Not on file  Social History Narrative   Not on file   Social Determinants of Health   Financial Resource Strain: Low Risk    Difficulty of Paying Living Expenses: Not hard at all  Food Insecurity: No Food Insecurity   Worried About [place] fund[place] in the Last Year: Never true   [place] in the Last Year: Never true  Transportation Needs: No Transportation Needs   Lack of Transportation (Medical): No   Lack of Transportation (Non-Medical): No  Physical Activity: Sufficiently Active   Days of Exercise per Week: 3 days   Minutes of Exercise per Session: 60 min  Stress: No Stress Concern Present   Feeling of Stress : Not at all  Social Connections: Moderately Isolated   Frequency of Communication with Friends and Family: More than three times a week   Frequency of Social Gatherings with Friends and Family: More than three times a week   Attends Religious Services: 1 to 4 times per year   Active Member of [employer] or Organizations: No   Attends Archivist Meetings: Never   Marital Status: Widowed  Human resources officer Violence: Not At Risk   Fear of Current or Ex-Partner: No   Emotionally Abused: No   Physically  Abused: No   Sexually Abused: No   Family History  Problem Relation Age of Onset   Hypertension Mother    OBJECTIVE:  Vitals:   12/09/20 1007  BP: 134/75  Pulse: 79  Temp: (!) 97.5 F (36.4 C)  TempSrc: Temporal  SpO2: 96%  Weight: 180 lb 12.8 oz (82 kg)  Height: '5\' 8"'  (1.727 m)   Physical Exam Vitals reviewed.  Constitutional:      Appearance: Normal appearance. He is normal weight.  HENT:     Head: Normocephalic and atraumatic.     Right Ear: Tympanic membrane and external ear normal.     Left Ear: Tympanic membrane and external ear normal.     Nose: Nose normal.  Eyes:     Extraocular Movements: Extraocular movements intact.     Pupils: Pupils are equal, round, and reactive to light.  Neck:     Comments: Goiter  Cardiovascular:     Rate and Rhythm: Normal rate and regular rhythm.  Pulmonary:     Effort: Pulmonary effort is normal.     Breath sounds: Normal breath sounds.  Abdominal:     General: Abdomen is flat. Bowel sounds are normal.     Palpations: Abdomen is soft.  Musculoskeletal:        General: Normal range of motion.     Cervical back: Normal range of motion.  Skin:    General: Skin is warm and dry.     Findings: Rash present.     Comments: Papules, papulovesicles, and erosions bilateral hands and arms   Neurological:     Mental Status: He is alert and oriented to person, place, and time.  Psychiatric:        Mood and Affect: Mood normal.        Behavior: Behavior normal.        Thought Content: Thought content normal.        Judgment: Judgment normal.     Review of Systems  Skin:  Positive for itching and rash.  All other systems reviewed and are negative.  Last 3 Office BP readings: BP Readings from Last 3 Encounters:  12/09/20 134/75  11/10/20 (!) 149/80  09/05/20 (!) 147/81    BMET    Component Value Date/Time   NA 140 06/17/2020 1431   NA 138 11/10/2017 0858   K 4.0 06/17/2020 1431   CL 105 06/17/2020 1431   CO2 24  06/17/2020 1431   GLUCOSE 81 06/17/2020 1431   BUN 14 06/17/2020 1431   BUN 14 11/10/2017 0858   CREATININE 1.17 06/17/2020 1431   CALCIUM 9.9 06/17/2020 1431   GFRNONAA 63 06/17/2020 1431   GFRAA 73 06/17/2020 1431    Renal function: CrCl cannot be calculated (Patient's most recent lab result is older than the maximum 21 days allowed.).  Clinical ASCVD: Yes  The 10-year ASCVD risk  score Mikey Bussing DC Jr., et al., 2013) is: 54.7%   Values used to calculate the score:     Age: 81 years     Sex: Male     Is Non-Hispanic African American: Yes     Diabetic: Yes     Tobacco smoker: Yes     Systolic Blood Pressure: 161 mmHg     Is BP treated: Yes     HDL Cholesterol: 49 mg/dL     Total Cholesterol: 225 mg/dL  ASCVD risk factors include- [place]   ASSESSMENT & PLAN:   -Counseled on lifestyle modifications for blood pressure control including reduced dietary sodium, increased exercise, weight reduction and adequate sleep. Also, educated patient about the risk for cardiovascular events, stroke and heart attack. Also counseled patient about the importance of medication adherence. If you participate in smoking, it is important to stop using tobacco as this will increase the risks associated with uncontrolled blood pressure.   -Hypertension longstanding diagnosed currently lisinopril 48m daily  on current medications. Patient is adherent with current medications.   Goal BP:  For patients younger than 60: Goal BP < 130/80. For patients 60 and older: Goal BP < 140/90. For patients with diabetes: Goal BP < 130/80. Your most recent BP: 134/75  Minimize salt intake. Minimize alcohol intake DGoblewas seen today for blood pressure check.  Diagnoses and all orders for this visit:  Essential hypertension Counseled on blood pressure goal of less than 130/80, low-sodium, DASH diet, medication compliance, 150 minutes of moderate intensity exercise per week. Discussed medication compliance, adverse  effects.  -     CMP14+EGFR  Irritant contact dermatitis due to food in contact with skin Picking Okra Papules, papulovesicles, and erosions over elbows , arms and hands      -     predniSONE (DELTASONE) 10 MG tablet; Take 1 tablet (10 mg total) by mouth daily with breakfast. Prednisone dose pack directions Day 1 take 6 tablets Day 2 take 5 tablets Day 3 take 4 tablets Day 4 take 3 tablets Day 3 take 2 tablets Day 4 take 1 tablet -     hydrOXYzine (ATARAX/VISTARIL) 10 MG tablet; Take 1 tablet (10 mg total) by mouth 3 (three) times daily as needed.  Colon cancer screening -     Cologuard  Mixed hyperlipidemia  Healthy lifestyle diet of fruits vegetables fish nuts whole grains and low saturated fat . Foods high in cholesterol or liver, fatty meats,cheese, butter avocados, nuts and seeds, chocolate and fried foods. Currently taking atorvastatin 474m-re-eval dosage after labs  -     Lipid Panel  Tobacco dependence - I have recommended complete cessation of tobacco use. I have discussed various options available for assistance with tobacco cessation including over the counter methods (Nicotine gum, patch and lozenges). We also discussed prescription options (Chantix, Nicotine Inhaler / Nasal Spray). The patient is not interested in pursuing any prescription tobacco cessation options at this time. - Patient declines at this time.   Need for immunization against influenza -     Flu Vaccine QUAD 67m27mo (Fluarix, Fluzone & Alfiuria Quad PF)  Other orders -     lisinopril (ZESTRIL) 20 MG tablet; Take 1 tablet (20 mg total) by mouth daily.     This note has been created with [employer], quantityny transcriptional errors are unintentional.   MicKerin PernaP 12/09/2020, 10:11 AM

## 2020-12-09 NOTE — Patient Instructions (Signed)
Influenza, Adult °Influenza is also called "the flu." It is an infection in the lungs, nose, and throat (respiratory tract). It spreads easily from person to person (is contagious). The flu causes symptoms that are like a cold, along with high fever and body aches. °What are the causes? °This condition is caused by the influenza virus. You can get the virus by: °Breathing in droplets that are in the air after a person infected with the flu coughed or sneezed. °Touching something that has the virus on it and then touching your mouth, nose, or eyes. °What increases the risk? °Certain things may make you more likely to get the flu. These include: °Not washing your hands often. °Having close contact with many people during cold and flu season. °Touching your mouth, eyes, or nose without first washing your hands. °Not getting a flu shot every year. °You may have a higher risk for the flu, and serious problems, such as a lung infection (pneumonia), if you: °Are older than 65. °Are pregnant. °Have a weakened disease-fighting system (immune system) because of a disease or because you are taking certain medicines. °Have a long-term (chronic) condition, such as: °Heart, kidney, or lung disease. °Diabetes. °Asthma. °Have a liver disorder. °Are very overweight (morbidly obese). °Have anemia. °What are the signs or symptoms? °Symptoms usually begin suddenly and last 4-14 days. They may include: °Fever and chills. °Headaches, body aches, or muscle aches. °Sore throat. °Cough. °Runny or stuffy (congested) nose. °Feeling discomfort in your chest. °Not wanting to eat as much as normal. °Feeling weak or tired. °Feeling dizzy. °Feeling sick to your stomach or throwing up. °How is this treated? °If the flu is found early, you can be treated with antiviral medicine. This can help to reduce how bad the illness is and how long it lasts. This may be given by mouth or through an IV tube. °Taking care of yourself at home can help your  symptoms get better. Your doctor may want you to: °Take over-the-counter medicines. °Drink plenty of fluids. °The flu often goes away on its own. If you have very bad symptoms or other problems, you may be treated in a hospital. °Follow these instructions at home: °  °Activity °Rest as needed. Get plenty of sleep. °Stay home from work or school as told by your doctor. °Do not leave home until you do not have a fever for 24 hours without taking medicine. °Leave home only to go to your doctor. °Eating and drinking °Take an ORS (oral rehydration solution). This is a drink that is sold at pharmacies and stores. °Drink enough fluid to keep your pee pale yellow. °Drink clear fluids in small amounts as you are able. Clear fluids include: °Water. °Ice chips. °Fruit juice mixed with water. °Low-calorie sports drinks. °Eat bland foods that are easy to digest. Eat small amounts as you are able. These foods include: °Bananas. °Applesauce. °Rice. °Lean meats. °Toast. °Crackers. °Do not eat or drink: °Fluids that have a lot of sugar or caffeine. °Alcohol. °Spicy or fatty foods. °General instructions °Take over-the-counter and prescription medicines only as told by your doctor. °Use a cool mist humidifier to add moisture to the air in your home. This can make it easier for you to breathe. °When using a cool mist humidifier, clean it daily. Empty water and replace with clean water. °Cover your mouth and nose when you cough or sneeze. °Wash your hands with soap and water often and for at least 20 seconds. This is also important after   you cough or sneeze. If you cannot use soap and water, use alcohol-based hand sanitizer. °Keep all follow-up visits. °How is this prevented? ° °Get a flu shot every year. You may get the flu shot in late summer, fall, or winter. Ask your doctor when you should get your flu shot. °Avoid contact with people who are sick during fall and winter. This is cold and flu season. °Contact a doctor if: °You get  new symptoms. °You have: °Chest pain. °Watery poop (diarrhea). °A fever. °Your cough gets worse. °You start to have more mucus. °You feel sick to your stomach. °You throw up. °Get help right away if you: °Have shortness of breath. °Have trouble breathing. °Have skin or nails that turn a bluish color. °Have very bad pain or stiffness in your neck. °Get a sudden headache. °Get sudden pain in your face or ear. °Cannot eat or drink without throwing up. °These symptoms may represent a serious problem that is an emergency. Get medical help right away. Call your local emergency services (911 in the [place] °Do not wait to see if the symptoms will go away. °Do not drive yourself to the hospital. °Summary °Influenza is also called "the flu." It is an infection in the lungs, nose, and throat. It spreads easily from person to person. °Take over-the-counter and prescription medicines only as told by your doctor. °Getting a flu shot every year is the best way to not get the flu. °This information is not intended to replace advice given to you by your health care provider. Make sure you discuss any questions you have with your health care provider. °Document Revised: 11/09/2019 Document Reviewed: 11/09/2019 °Elsevier Patient Education © 2022 [employer] Inc. ° °

## 2020-12-10 ENCOUNTER — Telehealth (INDEPENDENT_AMBULATORY_CARE_PROVIDER_SITE_OTHER): Payer: Self-pay

## 2020-12-10 ENCOUNTER — Other Ambulatory Visit (INDEPENDENT_AMBULATORY_CARE_PROVIDER_SITE_OTHER): Payer: Self-pay | Admitting: Primary Care

## 2020-12-10 DIAGNOSIS — E782 Mixed hyperlipidemia: Secondary | ICD-10-CM

## 2020-12-10 LAB — CMP14+EGFR
ALT: 38 IU/L (ref 0–44)
AST: 23 IU/L (ref 0–40)
Albumin/Globulin Ratio: 1.8 (ref 1.2–2.2)
Albumin: 4.4 g/dL (ref 3.8–4.8)
Alkaline Phosphatase: 85 IU/L (ref 44–121)
BUN/Creatinine Ratio: 12 (ref 10–24)
BUN: 13 mg/dL (ref 8–27)
Bilirubin Total: 0.6 mg/dL (ref 0.0–1.2)
CO2: 22 mmol/L (ref 20–29)
Calcium: 9.3 mg/dL (ref 8.6–10.2)
Chloride: 104 mmol/L (ref 96–106)
Creatinine, Ser: 1.09 mg/dL (ref 0.76–1.27)
Globulin, Total: 2.4 g/dL (ref 1.5–4.5)
Glucose: 113 mg/dL — ABNORMAL HIGH (ref 65–99)
Potassium: 4.6 mmol/L (ref 3.5–5.2)
Sodium: 139 mmol/L (ref 134–144)
Total Protein: 6.8 g/dL (ref 6.0–8.5)
eGFR: 73 mL/min/{1.73_m2} (ref >59)

## 2020-12-10 LAB — LIPID PANEL
Chol/HDL Ratio: 4 ratio (ref 0.0–5.0)
Cholesterol, Total: 160 mg/dL (ref 100–199)
HDL: 40 mg/dL (ref >39)
LDL Chol Calc (NIH): 94 mg/dL (ref 0–99)
Triglycerides: 146 mg/dL (ref 0–149)
VLDL Cholesterol Cal: 26 mg/dL (ref 5–40)

## 2020-12-10 MED ORDER — ATORVASTATIN CALCIUM 20 MG PO TABS: 20.0000 mg | ORAL_TABLET | Freq: Every day | ORAL | 3 refills | Status: DC

## 2020-12-10 NOTE — Telephone Encounter (Signed)
-----   Message from Kerin Perna, NP sent at 12/10/2020  3:03 PM EDT ----- Labs are normal . Cholesterol improved continue taking atorvastatin '40mg'$  until gone than pick up new prescriptions for atorvastatin 20 mg at bedtime.   Healthy lifestyle diet of fruits vegetables fish nuts whole grains and low saturated fat . Foods high in cholesterol or liver, fatty meats,cheese, butter avocados, nuts and seeds, chocolate and fried foods.

## 2020-12-10 NOTE — Telephone Encounter (Signed)
Patient aware of normal labs and cholesterol improving. He has already picked up rx for 20 mg atorvastatin as he had already ran out of the 40 mg. Nat Christen, CMA

## 2020-12-23 ENCOUNTER — Encounter: Payer: Self-pay | Admitting: Family

## 2020-12-31 ENCOUNTER — Other Ambulatory Visit (INDEPENDENT_AMBULATORY_CARE_PROVIDER_SITE_OTHER): Payer: Self-pay | Admitting: Primary Care

## 2020-12-31 DIAGNOSIS — R195 Other fecal abnormalities: Secondary | ICD-10-CM

## 2020-12-31 LAB — COLOGUARD: Cologuard: POSITIVE — AB

## 2021-01-01 ENCOUNTER — Other Ambulatory Visit: Payer: Self-pay | Admitting: Family

## 2021-01-01 DIAGNOSIS — B20 Human immunodeficiency virus [HIV] disease: Secondary | ICD-10-CM

## 2021-01-07 ENCOUNTER — Ambulatory Visit: Payer: Medicare (Managed Care) | Admitting: Family

## 2021-01-07 ENCOUNTER — Encounter: Payer: Self-pay | Admitting: Family

## 2021-01-07 ENCOUNTER — Other Ambulatory Visit: Payer: Self-pay

## 2021-01-07 VITALS — BP 133/74 | HR 76 | Temp 98.1°F | Wt 183.0 lb

## 2021-01-07 DIAGNOSIS — B20 Human immunodeficiency virus [HIV] disease: Secondary | ICD-10-CM

## 2021-01-07 DIAGNOSIS — Z0000 Encounter for general adult medical examination without abnormal findings: Secondary | ICD-10-CM

## 2021-01-07 MED ORDER — BIKTARVY 50-200-25 MG PO TABS: 1.0000 | ORAL_TABLET | Freq: Every day | ORAL | 6 refills | Status: DC

## 2021-01-07 NOTE — Assessment & Plan Note (Signed)
   Discussed importance of safe sexual practices and condom usage.  Condoms offered.  Routine vaccines up-to-date.  Due for routine dental care.

## 2021-01-07 NOTE — Assessment & Plan Note (Signed)
Mr. Wold continues to have well-controlled virus with good adherence and tolerance to his ART regimen of Biktarvy.  No signs/symptoms of opportunistic infection.  Reviewed lab work and discussed plan of care.  Check blood work today.  Continue current dose of Biktarvy.  Plan for follow-up in 6 months or sooner if needed with lab work on the same day.

## 2021-01-07 NOTE — Progress Notes (Signed)
Brief Narrative   Patient ID: Juan Johnston, male    DOB: 1950/08/22, 70 y.o.   MRN: [id number]  Juan Johnston is a 70 year old African-American male diagnosed with HIV disease in December 2018 with risk factor of MSM.  Initial CD4 count of 70 8 and viral load of 1.2 million.  Initial genotype performed on 04/21/2017 with K103 (efavirenz and nevirapine) and E138G (rilpivirine).  [id number] negative.  Initial regimen of Symtuza.  Entered care at [facility] stage III.  Now on Biktarvy.  Subjective:    Chief Complaint  Patient presents with   Follow-up    B20    HPI:  Juan Johnston is a 70 y.o. male with HIV disease last seen on 07/02/20 with well controlled virus and good adherence and tolerance to his ART regimen of Biktarvy. Viral load was undetectable and CD4 count 438. Here today for routine follow up.   Juan Johnston continues to take his Biktarvy daily as prescribed with no adverse side effects.  Overall feeling well today with no new concerns/complaints. Denies fevers, chills, night sweats, headaches, changes in vision, neck pain/stiffness, nausea, diarrhea, vomiting, lesions or rashes.  Juan Johnston has no problems obtaining medication from pharmacy and remains covered through [facility].  Denies feelings of being down, depressed, or hopeless recently.  No current recreational or illicit drug use and remains a some day smoker and occasional alcohol consumption.  Condoms offered and declined.  Due for routine dental care.  No Known Allergies    Outpatient Medications Prior to Visit  Medication Sig Dispense Refill   aspirin 81 MG chewable tablet Chew 1 tablet (81 mg total) by mouth daily. 30 tablet 0   atorvastatin (LIPITOR) 20 MG tablet Take 1 tablet (20 mg total) by mouth daily. 90 tablet 3   hydrOXYzine (ATARAX/VISTARIL) 10 MG tablet Take 1 tablet (10 mg total) by mouth 3 (three) times daily as needed. 30 tablet 0   lisinopril (ZESTRIL) 20 MG tablet Take 1 tablet (20 mg  total) by mouth daily. 90 tablet 1   predniSONE (DELTASONE) 10 MG tablet Take 1 tablet (10 mg total) by mouth daily with breakfast. Prednisone dose pack directions Day 1 take 6 tablets Day 2 take 5 tablets Day 3 take 4 tablets Day 4 take 3 tablets Day 3 take 2 tablets Day 4 take 1 tablet 21 tablet 0   BIKTARVY 50-200-25 MG TABS tablet TAKE 1 TABLET BY MOUTH DAILY 30 tablet 0   No facility-administered medications prior to visit.     Past Medical History:  Diagnosis Date   HIV infection ([place])    Thyroid goiter      Past Surgical History:  Procedure Laterality Date   INTRAVASCULAR ULTRASOUND/IVUS N/A 07/21/2017   Procedure: Intravascular Ultrasound/IVUS;  Surgeon: Waynetta Sandy, MD;  Location: [place] [facility];  Service: Cardiovascular;  Laterality: N/A;   PERIPHERAL VASCULAR THROMBECTOMY N/A 07/20/2017   Procedure: PERIPHERAL VASCULAR THROMBECTOMY;  Surgeon: Serafina Mitchell, MD;  Location: [place] [facility];  Service: Cardiovascular;  Laterality: N/A;   PERIPHERAL VASCULAR THROMBECTOMY N/A 07/21/2017   Procedure: PERIPHERAL VASCULAR THROMBECTOMY - LYSIS RECHECK;  Surgeon: Waynetta Sandy, MD;  Location: [place] [facility];  Service: Cardiovascular;  Laterality: N/A;   scrotum cyst        Review of Systems  Constitutional:  Negative for appetite change, chills, fatigue, fever and unexpected weight change.  Eyes:  Negative for visual disturbance.  Respiratory:  Negative for cough, chest tightness, shortness  of breath and wheezing.   Cardiovascular:  Negative for chest pain and leg swelling.  Gastrointestinal:  Negative for abdominal pain, constipation, diarrhea, nausea and vomiting.  Genitourinary:  Negative for dysuria, flank pain, frequency, genital sores, hematuria and urgency.  Skin:  Negative for rash.  Allergic/Immunologic: Negative for immunocompromised state.  Neurological:  Negative for dizziness and headaches.     Objective:    BP 133/74   Pulse  76   Temp 98.1 F (36.7 C) (Oral)   Wt 183 lb (83 kg)   BMI 27.83 kg/m  Nursing note and vital signs reviewed.  Physical Exam Constitutional:      General: He is not in acute distress.    Appearance: He is well-developed.  Eyes:     Conjunctiva/sclera: Conjunctivae normal.  Cardiovascular:     Rate and Rhythm: Normal rate and regular rhythm.     Heart sounds: Normal heart sounds. No murmur heard.   No friction rub. No gallop.  Pulmonary:     Effort: Pulmonary effort is normal. No respiratory distress.     Breath sounds: Normal breath sounds. No wheezing or rales.  Chest:     Chest wall: No tenderness.  Abdominal:     General: Bowel sounds are normal.     Palpations: Abdomen is soft.     Tenderness: There is no abdominal tenderness.  Musculoskeletal:     Cervical back: Neck supple.  Lymphadenopathy:     Cervical: No cervical adenopathy.  Skin:    General: Skin is warm and dry.     Findings: No rash.  Neurological:     Mental Status: He is alert and oriented to person, place, and time.  Psychiatric:        Behavior: Behavior normal.        Thought Content: Thought content normal.        Judgment: Judgment normal.     Depression screen [facility] 2/9 12/09/2020 11/29/2020 11/10/2020 09/05/2020 07/02/2020  Decreased Interest 0 0 0 0 0  Down, Depressed, Hopeless 0 0 0 0 0  PHQ - 2 Score 0 0 0 0 0  Altered sleeping - - - - -  Tired, decreased energy - - - - -  Change in appetite - - - - -  Feeling bad or failure about yourself  - - - - -  Trouble concentrating - - - - -  Moving slowly or fidgety/restless - - - - -  Suicidal thoughts - - - - -  PHQ-9 Score - - - - -       Assessment & Plan:    Patient Active Problem List   Diagnosis Date Noted   Essential hypertension 11/10/2020   Erectile dysfunction due to diseases classified elsewhere 03/26/2019   Rash and nonspecific skin eruption 12/12/2018   Hyperlipidemia associated with type 2 diabetes mellitus ([place]) 05/11/2018    Leg DVT (deep venous thromboembolism), chronic, right ([place]) 07/20/2017   Asymptomatic HIV infection ([place]) 07/19/2017   GERD (gastroesophageal reflux disease) 07/19/2017   Cough 07/15/2017   Thyroid goiter 05/19/2017   HIV disease ([place]) 04/21/2017   Healthcare maintenance 04/21/2017   Hyperlipidemia 04/21/2017     Problem List Items Addressed This Visit       Other   HIV disease ([place]) - Primary    Juan Johnston continues to have well-controlled virus with good adherence and tolerance to his ART regimen of Biktarvy.  No signs/symptoms of opportunistic infection.  Reviewed lab work and discussed plan  of care.  Check blood work today.  Continue current dose of Biktarvy.  Plan for follow-up in 6 months or sooner if needed with lab work on the same day.      Relevant Medications   bictegravir-emtricitabine-tenofovir AF (BIKTARVY) 50-200-25 MG TABS tablet   Other Relevant Orders   HIV-1 RNA quant-no reflex-bld   T-helper cell (CD4)- ([facility] clinic only)   Healthcare maintenance    Discussed importance of safe sexual practices and condom usage.  Condoms offered. Routine vaccines up-to-date. Due for routine dental care.        I have changed [employer]. I am also having him maintain his aspirin, lisinopril, predniSONE, hydrOXYzine, and atorvastatin.   Meds ordered this encounter  Medications   bictegravir-emtricitabine-tenofovir AF (BIKTARVY) 50-200-25 MG TABS tablet    Sig: Take 1 tablet by mouth daily.    Dispense:  30 tablet    Refill:  6    Order Specific Question:   Supervising Provider    Answer:   Carlyle Basques [[id number]]     Follow-up: Return in about 6 months (around 07/08/2021).   Terri Piedra, MSN, FNP-C Nurse Practitioner [facility] for Infectious Disease [place] number: [phone]

## 2021-01-07 NOTE — Patient Instructions (Addendum)
Nice to see you.  We will check your lab work today.  Continue to take your medications daily.   Refills have been sent to the pharmacy.  Plan for follow up in 6 months or sooner if needed with lab work on the same day.  Have a great day and stay safe!

## 2021-01-08 LAB — T-HELPER CELL (CD4) - (RCID CLINIC ONLY)
CD4 % Helper T Cell: 27 % — ABNORMAL LOW (ref 33–65)
CD4 T Cell Abs: 410 /uL (ref 400–1790)

## 2021-01-09 LAB — HIV-1 RNA QUANT-NO REFLEX-BLD
HIV 1 RNA Quant: NOT DETECTED Copies/mL
HIV-1 RNA Quant, Log: NOT DETECTED Log cps/mL

## 2021-03-04 ENCOUNTER — Other Ambulatory Visit (INDEPENDENT_AMBULATORY_CARE_PROVIDER_SITE_OTHER): Payer: Self-pay | Admitting: Primary Care

## 2021-03-04 DIAGNOSIS — E782 Mixed hyperlipidemia: Secondary | ICD-10-CM

## 2021-03-04 NOTE — Telephone Encounter (Signed)
Sent to PCP ?

## 2021-04-10 ENCOUNTER — Encounter (INDEPENDENT_AMBULATORY_CARE_PROVIDER_SITE_OTHER): Payer: Self-pay | Admitting: Primary Care

## 2021-04-10 ENCOUNTER — Ambulatory Visit (INDEPENDENT_AMBULATORY_CARE_PROVIDER_SITE_OTHER): Payer: Medicare (Managed Care) | Admitting: Primary Care

## 2021-04-10 ENCOUNTER — Other Ambulatory Visit: Payer: Self-pay

## 2021-04-10 VITALS — BP 153/92 | HR 61 | Temp 97.5°F | Ht 68.0 in | Wt 183.2 lb

## 2021-04-10 DIAGNOSIS — I10 Essential (primary) hypertension: Secondary | ICD-10-CM | POA: Diagnosis not present

## 2021-04-10 DIAGNOSIS — E0800 Diabetes mellitus due to underlying condition with hyperosmolarity without nonketotic hyperglycemic-hyperosmolar coma (NKHHC): Secondary | ICD-10-CM

## 2021-04-10 DIAGNOSIS — R7309 Other abnormal glucose: Secondary | ICD-10-CM

## 2021-04-10 DIAGNOSIS — R7303 Prediabetes: Secondary | ICD-10-CM | POA: Diagnosis not present

## 2021-04-10 LAB — POCT GLYCOSYLATED HEMOGLOBIN (HGB A1C): Hemoglobin A1C: 6.8 % — AB (ref 4.0–5.6)

## 2021-04-10 MED ORDER — LISINOPRIL 40 MG PO TABS: 40.0000 mg | ORAL_TABLET | Freq: Every day | ORAL | 1 refills | Status: DC

## 2021-04-10 MED ORDER — METFORMIN HCL ER 500 MG PO TB24: 500.0000 mg | ORAL_TABLET | Freq: Every day | ORAL | 1 refills | Status: DC

## 2021-04-10 NOTE — Patient Instructions (Signed)

## 2021-04-10 NOTE — Progress Notes (Signed)
Juan Johnston   [name] is a 71 y.o. male presents for hypertension evaluation, Denies shortness of breath, headaches, chest pain or lower extremity edema, sudden onset, vision changes, unilateral weakness, dizziness, paresthesias. Management of T2D . Patient does not check blood sugar at home  Compliant with meds - Yes Checking CBGs? No  Fasting avg -   Postprandial average -  Exercising regularly? - Yes Watching carbohydrate intake? - Yes Neuropathy ? - No Hypoglycemic events - No  - Recovers with :   Pertinent ROS:  Polyuria - Yes Polydipsia - No Vision problems - No  Patient reports adherence with medications.  Dietary habits include: monitor sodium intake but continue to smoke  Exercise habits include:yes  Family / Social history: Mother -HTN   Past Medical History:  Diagnosis Date   HIV infection ([place])    Thyroid goiter    Past Surgical History:  Procedure Laterality Date   INTRAVASCULAR ULTRASOUND/IVUS N/A 07/21/2017   Procedure: Intravascular Ultrasound/IVUS;  Surgeon: Waynetta Sandy, MD;  Location: [place] [facility];  Service: Cardiovascular;  Laterality: N/A;   PERIPHERAL VASCULAR THROMBECTOMY N/A 07/20/2017   Procedure: PERIPHERAL VASCULAR THROMBECTOMY;  Surgeon: Serafina Mitchell, MD;  Location: [place] [facility];  Service: Cardiovascular;  Laterality: N/A;   PERIPHERAL VASCULAR THROMBECTOMY N/A 07/21/2017   Procedure: PERIPHERAL VASCULAR THROMBECTOMY - LYSIS RECHECK;  Surgeon: Waynetta Sandy, MD;  Location: [place] [facility];  Service: Cardiovascular;  Laterality: N/A;   scrotum cyst     No Known Allergies Current Outpatient Medications on File Prior to Visit  Medication Sig Dispense Refill   aspirin 81 MG chewable tablet Chew 1 tablet (81 mg total) by mouth daily. 30 tablet 0   atorvastatin (LIPITOR) 20 MG tablet Take 1 tablet (20 mg total) by mouth daily. 90 tablet 3   bictegravir-emtricitabine-tenofovir  AF (BIKTARVY) 50-200-25 MG TABS tablet Take 1 tablet by mouth daily. 30 tablet 6   hydrOXYzine (ATARAX/VISTARIL) 10 MG tablet Take 1 tablet (10 mg total) by mouth 3 (three) times daily as needed. 30 tablet 0   No current facility-administered medications on file prior to visit.   Social History   Socioeconomic History   Marital status: Single    Spouse name: Not on file   Number of children: Not on file   Years of education: Not on file   Highest education level: Not on file  Occupational History   Not on file  Tobacco Use   Smoking status: Some Days    Types: Cigarettes    Last attempt to quit: 02/03/2017    Years since quitting: 4.1   Smokeless tobacco: Never   Tobacco comments:    1 pk lasts 2 days. "Does not inhale"  Vaping Use   Vaping Use: Never used  Substance and Sexual Activity   Alcohol use: Yes   Drug use: No   Sexual activity: Yes    Partners: Male    Birth control/protection: None    Comment: declined condoms  Other Topics Concern   Not on file  Social History Narrative   Not on file   Social Determinants of Health   Financial Resource Strain: Low Risk    Difficulty of Paying Living Expenses: Not hard at all  Food Insecurity: No Food Insecurity   Worried About Charity fundraiser in the Last Year: Never true   [place] in the Last Year: Never true  Transportation Needs: No Transportation Needs  Lack of Transportation (Medical): No   Lack of Transportation (Non-Medical): No  Physical Activity: Sufficiently Active   Days of Exercise per Week: 3 days   Minutes of Exercise per Session: 60 min  Stress: No Stress Concern Present   Feeling of Stress : Not at all  Social Connections: Moderately Isolated   Frequency of Communication with Friends and Family: More than three times a week   Frequency of Social Gatherings with Friends and Family: More than three times a week   Attends Religious Services: 1 to 4 times per year   Active Member of [employer] or  Organizations: No   Attends Archivist Meetings: Never   Marital Status: Widowed  Human resources officer Violence: Not At Risk   Fear of Current or Ex-Partner: No   Emotionally Abused: No   Physically Abused: No   Sexually Abused: No   Family History  Problem Relation Age of Onset   Hypertension Mother      OBJECTIVE:  Vitals:   04/10/21 1120 04/10/21 1133  BP: (!) 156/95 (!) 153/92  Pulse: 72 61  Temp: (!) 97.5 F (36.4 C)   TempSrc: Temporal   SpO2: 94%   Weight: 183 lb 3.2 oz (83.1 kg)   Height: 5\' 8"  (1.727 m)     Physical Exam General: No apparent distress. Eyes: Extraocular eye movements intact, pupils equal and round. Neck: Supple, trachea midline. Thyroid: No enlargement, mobile without fixation, no tenderness. Cardiovascular: Regular rhythm and rate, no murmur, normal radial pulses. Respiratory: Normal respiratory effort, clear to auscultation. Gastrointestinal: Normal pitch active bowel sounds, nontender abdomen without distention or appreciable hepatomegaly. Neurologic: A&O normal , alert Musculoskeletal: Normal muscle tone, no tenderness on palpation of tibia, no excessive thoracic kyphosis. Skin: Appropriate warmth, no visible rash. Mental status: Alert, conversant, speech clear, thought logical, appropriate mood and affect, no hallucinations or delusions evident. Hematologic/lymphatic: No cervical adenopathy, no visible ecchymoses.   ROS Comprehensive ROS pertinent noted in HPI  Last 3 Office BP readings: BP Readings from Last 3 Encounters:  04/10/21 (!) 153/92  01/07/21 133/74  12/09/20 134/75    BMET    Component Value Date/Time   NA 139 12/09/2020 1118   K 4.6 12/09/2020 1118   CL 104 12/09/2020 1118   CO2 22 12/09/2020 1118   GLUCOSE 113 (H) 12/09/2020 1118   GLUCOSE 81 06/17/2020 1431   BUN 13 12/09/2020 1118   CREATININE 1.09 12/09/2020 1118   CREATININE 1.17 06/17/2020 1431   CALCIUM 9.3 12/09/2020 1118   GFRNONAA 63  06/17/2020 1431   GFRAA 73 06/17/2020 1431    Renal function: CrCl cannot be calculated (Patient's most recent lab result is older than the maximum 21 days allowed.).  Clinical ASCVD: Yes  The 10-year ASCVD risk score (Arnett DK, et al., 2019) is: 62.4%   Values used to calculate the score:     Age: 34 years     Sex: Male     Is Non-Hispanic African American: Yes     Diabetic: Yes     Tobacco smoker: Yes     Systolic Blood Pressure: 063 mmHg     Is BP treated: Yes     HDL Cholesterol: 40 mg/dL     Total Cholesterol: 160 mg/dL  ASCVD risk factors include- [place]  Kweku was seen today for hypertension and medication refill.  Diagnoses and all orders for this visit:   ASSESSMENT & PLAN:  Essential hypertension -Counseled on lifestyle modifications for blood pressure control including  reduced dietary sodium, increased exercise, weight reduction and adequate sleep. Also, educated patient about the risk for cardiovascular events, stroke and heart attack. Also counseled patient about the importance of medication adherence. If you participate in smoking, it is important to stop using tobacco as this will increase the risks associated with uncontrolled blood pressure.   -Hypertension longstanding diagnosed currently Zestril 20mg   on current medications. Patient is adherent with current medications.   Goal BP:  For patients younger than 60: Goal BP < 130/80. For patients 60 and older: Goal BP < 140/90. For patients with diabetes: Goal BP < 130/80. Your most recent BP: 153/92  Minimize salt intake. Minimize alcohol intake  Prediabetes -     HgB A1c 6.8 New dx T2D   Diabetes mellitus due to underlying condition with hyperosmolarity without coma, without long-term current use of insulin (HCC) New dx with A1C 6.8-  Monitor foods that are high in carbohydrates are the following rice, potatoes, breads, sugars, and pastas.  Reduction in the intake (eating) will assist in lowering your  blood sugars.  Metformin 500mg  XR daily  Other orders -     lisinopril (ZESTRIL) 40 MG tablet; Take 1 tablet (40 mg total) by mouth daily.      This note has been created with Surveyor, quantity. Any transcriptional errors are unintentional.   Kerin Perna, NP 04/10/2021, 11:39 AM

## 2021-04-27 ENCOUNTER — Other Ambulatory Visit (INDEPENDENT_AMBULATORY_CARE_PROVIDER_SITE_OTHER): Payer: Self-pay | Admitting: Primary Care

## 2021-04-27 DIAGNOSIS — E782 Mixed hyperlipidemia: Secondary | ICD-10-CM

## 2021-04-27 NOTE — Telephone Encounter (Signed)
Unclear why Rx was discontinued. Please refill if appropriate.

## 2021-04-28 ENCOUNTER — Other Ambulatory Visit (INDEPENDENT_AMBULATORY_CARE_PROVIDER_SITE_OTHER): Payer: Self-pay | Admitting: Primary Care

## 2021-04-28 DIAGNOSIS — E782 Mixed hyperlipidemia: Secondary | ICD-10-CM

## 2021-04-28 MED ORDER — ATORVASTATIN CALCIUM 20 MG PO TABS: 20.0000 mg | ORAL_TABLET | Freq: Every day | ORAL | 3 refills | Status: DC

## 2021-05-08 ENCOUNTER — Ambulatory Visit (INDEPENDENT_AMBULATORY_CARE_PROVIDER_SITE_OTHER): Payer: Medicare (Managed Care) | Admitting: Primary Care

## 2021-05-08 ENCOUNTER — Encounter (INDEPENDENT_AMBULATORY_CARE_PROVIDER_SITE_OTHER): Payer: Self-pay | Admitting: Primary Care

## 2021-05-08 ENCOUNTER — Other Ambulatory Visit: Payer: Self-pay

## 2021-05-08 VITALS — BP 137/82 | HR 64 | Temp 97.8°F | Ht 68.0 in | Wt 181.0 lb

## 2021-05-08 DIAGNOSIS — I10 Essential (primary) hypertension: Secondary | ICD-10-CM | POA: Diagnosis not present

## 2021-05-08 DIAGNOSIS — E785 Hyperlipidemia, unspecified: Secondary | ICD-10-CM

## 2021-05-08 DIAGNOSIS — E1169 Type 2 diabetes mellitus with other specified complication: Secondary | ICD-10-CM

## 2021-05-08 NOTE — Progress Notes (Signed)
Juan   Hassell Johnston is a 71 y.o. male presents for hypertension evaluation, Denies shortness of breath, headaches, chest pain or lower extremity edema, sudden onset, vision changes, unilateral weakness, dizziness, paresthesias   Patient reports adherence with medications.  Dietary habits include: tries to eat healthy Exercise habits include:walks Family / Social history: no   Past Medical History:  Diagnosis Date   HIV infection ([place])    Thyroid goiter    Past Surgical History:  Procedure Laterality Date   INTRAVASCULAR ULTRASOUND/IVUS N/A 07/21/2017   Procedure: Intravascular Ultrasound/IVUS;  Surgeon: Waynetta Sandy, MD;  Location: [facility];  Service: Cardiovascular;  Laterality: N/A;   PERIPHERAL VASCULAR THROMBECTOMY N/A 07/20/2017   Procedure: PERIPHERAL VASCULAR THROMBECTOMY;  Surgeon: Serafina Mitchell, MD;  Location: [facility];  Service: Cardiovascular;  Laterality: N/A;   PERIPHERAL VASCULAR THROMBECTOMY N/A 07/21/2017   Procedure: PERIPHERAL VASCULAR THROMBECTOMY - LYSIS RECHECK;  Surgeon: Waynetta Sandy, MD;  Location: [facility];  Service: Cardiovascular;  Laterality: N/A;   scrotum cyst     No Known Allergies Current Outpatient Medications on File Prior to Visit  Medication Sig Dispense Refill   aspirin 81 MG chewable tablet Chew 1 tablet (81 mg total) by mouth daily. 30 tablet 0   atorvastatin (LIPITOR) 20 MG tablet Take 1 tablet (20 mg total) by mouth daily. 90 tablet 3   bictegravir-emtricitabine-tenofovir AF (BIKTARVY) 50-200-25 MG TABS tablet Take 1 tablet by mouth daily. 30 tablet 6   hydrOXYzine (ATARAX/VISTARIL) 10 MG tablet Take 1 tablet (10 mg total) by mouth 3 (three) times daily as needed. 30 tablet 0   lisinopril (ZESTRIL) 40 MG tablet Take 1 tablet (40 mg total) by mouth daily. 90 tablet 1   metFORMIN (GLUCOPHAGE XR) 500 MG 24 hr tablet Take 1 tablet (500 mg total) by mouth daily  with breakfast. 90 tablet 1   No current facility-administered medications on file prior to visit.   Social History   Socioeconomic History   Marital status: Single    Spouse name: Not on file   Number of children: Not on file   Years of education: Not on file   Highest education level: Not on file  Occupational History   Not on file  Tobacco Use   Smoking status: Some Days    Types: Cigarettes    Last attempt to quit: 02/03/2017    Years since quitting: 4.2   Smokeless tobacco: Never   Tobacco comments:    1 pk lasts 2 days. "Does not inhale"  Vaping Use   Vaping Use: Never used  Substance and Sexual Activity   Alcohol use: Yes   Drug use: No   Sexual activity: Yes    Partners: Male    Birth control/protection: None    Comment: declined condoms  Other Topics Concern   Not on file  Social History Narrative   Not on file   Social Determinants of Health   Financial Resource Strain: Low Risk    Difficulty of Paying Living Expenses: Not hard at all  Food Insecurity: No Food Insecurity   Worried About Charity fundraiser in the Last Year: Never true   [place] in the Last Year: Never true  Transportation Needs: No Transportation Needs   Lack of Transportation (Medical): No   Lack of Transportation (Non-Medical): No  Physical Activity: Sufficiently Active   Days of Exercise per Week: 3 days   Minutes of  Exercise per Session: 60 min  Stress: No Stress Concern Present   Feeling of Stress : Not at all  Social Connections: Moderately Isolated   Frequency of Communication with Friends and Family: More than three times a week   Frequency of Social Gatherings with Friends and Family: More than three times a week   Attends Religious Services: 1 to 4 times per year   Active Member of [employer] or Organizations: No   Attends Archivist Meetings: Never   Marital Status: Widowed  Human resources officer Violence: Not At Risk   Fear of Current or Ex-Partner: No    Emotionally Abused: No   Physically Abused: No   Sexually Abused: No   Family History  Problem Relation Age of Onset   Hypertension Mother      OBJECTIVE: Comprehensive ROS Pertinent positive and negative noted in HPI   Vitals:   05/08/21 1049  BP: 137/82  Pulse: 64  Temp: 97.8 F (36.6 C)  TempSrc: Oral  SpO2: 97%  Weight: 181 lb (82.1 kg)  Height: 5\' 8"  (1.727 m)    Physical Exam Vitals reviewed.  Constitutional:      Appearance: Normal appearance.  HENT:     Head: Normocephalic.     Right Ear: Tympanic membrane and external ear normal.     Left Ear: Tympanic membrane and external ear normal.     Nose: Nose normal.  Eyes:     Extraocular Movements: Extraocular movements intact.     Pupils: Pupils are equal, round, and reactive to light.  Neck:     Comments: Goiter  Cardiovascular:     Rate and Rhythm: Normal rate and regular rhythm.  Pulmonary:     Effort: Pulmonary effort is normal.     Breath sounds: Normal breath sounds.  Abdominal:     General: Bowel sounds are normal.     Palpations: Abdomen is soft.  Musculoskeletal:        General: Normal range of motion.     Cervical back: Normal range of motion.  Skin:    General: Skin is warm and dry.  Neurological:     Mental Status: He is alert and oriented to person, place, and time.  Psychiatric:        Mood and Affect: Mood normal.        Behavior: Behavior normal.        Thought Content: Thought content normal.        Judgment: Judgment normal.    Last 3 Office BP readings: BP Readings from Last 3 Encounters:  05/08/21 137/82  04/10/21 (!) 153/92  01/07/21 133/74    BMET    Component Value Date/Time   NA 139 12/09/2020 1118   K 4.6 12/09/2020 1118   CL 104 12/09/2020 1118   CO2 22 12/09/2020 1118   GLUCOSE 113 (H) 12/09/2020 1118   GLUCOSE 81 06/17/2020 1431   BUN 13 12/09/2020 1118   CREATININE 1.09 12/09/2020 1118   CREATININE 1.17 06/17/2020 1431   CALCIUM 9.3 12/09/2020 1118    GFRNONAA 63 06/17/2020 1431   GFRAA 73 06/17/2020 1431    Renal function: CrCl cannot be calculated (Patient's most recent lab result is older than the maximum 21 days allowed.).  Clinical ASCVD: Yes  The 10-year ASCVD risk score (Arnett DK, et al., 2019) is: 54.7%   Values used to calculate the score:     Age: 11 years     Sex: Male     Is Non-Hispanic  African American: Yes     Diabetic: Yes     Tobacco smoker: Yes     Systolic Blood Pressure: 825 mmHg     Is BP treated: Yes     HDL Cholesterol: 40 mg/dL     Total Cholesterol: 160 mg/dL  ASCVD risk factors include- [place]   ASSESSMENT & PLAN: Juan Johnston was seen today for hypertension.  Diagnoses and all orders for this visit:  Essential hypertension -Counseled on lifestyle modifications for blood pressure control including reduced dietary sodium, increased exercise, weight reduction and adequate sleep. Also, educated patient about the risk for cardiovascular events, stroke and heart attack. Also counseled patient about the importance of medication adherence. If you participate in smoking, it is important to stop using tobacco as this will increase the risks associated with uncontrolled blood pressure.   -Hypertension longstanding diagnosed currently Lisinopril 40mg   on current medications. Patient is adherent with current medications.   Goal BP:  For patients younger than 60: Goal BP < 130/80. For patients 60 and older: Goal BP < 140/90. For patients with diabetes: Goal BP < 130/80. Your most recent BP: 137/82  Minimize salt intake. Minimize alcohol intake  Hyperlipidemia associated with type 2 diabetes mellitus (HCC)  Healthy lifestyle diet of fruits vegetables fish nuts whole grains and low saturated fat . Foods high in cholesterol or liver, fatty meats,cheese, butter avocados, nuts and seeds, chocolate and fried foods.  -     Lipid Panel  This note has been created with Automotive engineer. Any transcriptional errors are unintentional.   Kerin Perna, NP 05/08/2021, 11:24 AM

## 2021-05-08 NOTE — Progress Notes (Signed)
Pt is fasting and has not taken any medication this morning

## 2021-05-09 LAB — LIPID PANEL
Chol/HDL Ratio: 5.1 ratio — ABNORMAL HIGH (ref 0.0–5.0)
Cholesterol, Total: 159 mg/dL (ref 100–199)
HDL: 31 mg/dL — ABNORMAL LOW (ref >39)
LDL Chol Calc (NIH): 110 mg/dL — ABNORMAL HIGH (ref 0–99)
Triglycerides: 97 mg/dL (ref 0–149)
VLDL Cholesterol Cal: 18 mg/dL (ref 5–40)

## 2021-05-09 LAB — CMP14+EGFR
ALT: 26 IU/L (ref 0–44)
AST: 23 IU/L (ref 0–40)
Albumin/Globulin Ratio: 1.8 (ref 1.2–2.2)
Albumin: 4.4 g/dL (ref 3.8–4.8)
Alkaline Phosphatase: 77 IU/L (ref 44–121)
BUN/Creatinine Ratio: 11 (ref 10–24)
BUN: 10 mg/dL (ref 8–27)
Bilirubin Total: 0.5 mg/dL (ref 0.0–1.2)
CO2: 25 mmol/L (ref 20–29)
Calcium: 9.4 mg/dL (ref 8.6–10.2)
Chloride: 103 mmol/L (ref 96–106)
Creatinine, Ser: 0.94 mg/dL (ref 0.76–1.27)
Globulin, Total: 2.4 g/dL (ref 1.5–4.5)
Glucose: 119 mg/dL — ABNORMAL HIGH (ref 70–99)
Potassium: 4.3 mmol/L (ref 3.5–5.2)
Sodium: 139 mmol/L (ref 134–144)
Total Protein: 6.8 g/dL (ref 6.0–8.5)
eGFR: 87 mL/min/{1.73_m2} (ref >59)

## 2021-07-22 ENCOUNTER — Encounter: Payer: Self-pay | Admitting: Family

## 2021-07-22 ENCOUNTER — Other Ambulatory Visit: Payer: Self-pay

## 2021-07-22 ENCOUNTER — Ambulatory Visit: Payer: Medicare (Managed Care) | Admitting: Family

## 2021-07-22 VITALS — BP 110/71 | HR 77 | Temp 99.3°F | Wt 181.0 lb

## 2021-07-22 DIAGNOSIS — B20 Human immunodeficiency virus [HIV] disease: Secondary | ICD-10-CM | POA: Diagnosis not present

## 2021-07-22 DIAGNOSIS — Z0000 Encounter for general adult medical examination without abnormal findings: Secondary | ICD-10-CM

## 2021-07-22 MED ORDER — BIKTARVY 50-200-25 MG PO TABS: 1.0000 | ORAL_TABLET | Freq: Every day | ORAL | 6 refills | Status: DC

## 2021-07-22 NOTE — Assessment & Plan Note (Signed)
?   Discussed importance of safe sexual practice and condom use.  Condoms offered. ?? Declines Shingrix. ?? Colon cancer screening up-to-date with Cologuard. ?? Due for routine dental care which he will schedule independently.  Can refer to [facility] if needed.  ?

## 2021-07-22 NOTE — Progress Notes (Signed)
? ? ?Brief Narrative  ? ?Patient ID: Juan Johnston, male    DOB: 1950-07-24, 71 y.o.   MRN: [id number] ? ?Juan Johnston is a 71 year old African-American male diagnosed with HIV disease in December 2018 with risk factor of MSM.  Initial CD4 count of 70 8 and viral load of 1.2 million.  Initial genotype performed on 04/21/2017 with K103 (efavirenz and nevirapine) and E138G (rilpivirine).  [id number] negative.  Initial regimen of Symtuza.  Entered care at [facility] stage III.  Now on Biktarvy. ? ?Subjective:  ?  ?Chief Complaint  ?Patient presents with  ? Follow-up  ?   ?  ? ? ?HPI: ? ?Juan Johnston is a 71 y.o. male with HIV disease last seen on 01/07/2021 with well-controlled virus and good adherence and tolerance to his ART regimen of Biktarvy.  Viral load was undetectable with CD4 count of 410.  Kidney function, liver function, electrolytes within normal ranges.  Here today for routine follow-up. ? ?Juan Johnston continues to take his Biktarvy daily as prescribed with no adverse side effects.  Overall feeling well today with no new concerns/complaints. Denies fevers, chills, night sweats, headaches, changes in vision, neck pain/stiffness, nausea, diarrhea, vomiting, lesions or rashes. ? ?Juan Johnston has no problems taking medication from the pharmacy remains covered by [facility].  Denies feelings of being down, depressed, or hopeless recently.  No current recreational illicit drug use, tobacco use, or alcohol consumption.  Condoms offered.  Healthcare maintenance due includes Shingrix.  Completed Cologuard; Due for dental care. No condoms.  ? ?No Known Allergies ? ? ? ?Outpatient Medications Prior to Visit  ?Medication Sig Dispense Refill  ? aspirin 81 MG chewable tablet Chew 1 tablet (81 mg total) by mouth daily. 30 tablet 0  ? atorvastatin (LIPITOR) 20 MG tablet Take 1 tablet (20 mg total) by mouth daily. 90 tablet 3  ? hydrOXYzine (ATARAX/VISTARIL) 10 MG tablet Take 1 tablet (10 mg total) by mouth 3  (three) times daily as needed. 30 tablet 0  ? lisinopril (ZESTRIL) 40 MG tablet Take 1 tablet (40 mg total) by mouth daily. 90 tablet 1  ? metFORMIN (GLUCOPHAGE XR) 500 MG 24 hr tablet Take 1 tablet (500 mg total) by mouth daily with breakfast. 90 tablet 1  ? bictegravir-emtricitabine-tenofovir AF (BIKTARVY) 50-200-25 MG TABS tablet Take 1 tablet by mouth daily. 30 tablet 6  ? ?No facility-administered medications prior to visit.  ? ? ? ?Past Medical History:  ?Diagnosis Date  ? HIV infection ([place])   ? Thyroid goiter   ? ? ? ?Past Surgical History:  ?Procedure Laterality Date  ? INTRAVASCULAR ULTRASOUND/IVUS N/A 07/21/2017  ? Procedure: Intravascular Ultrasound/IVUS;  Surgeon: Waynetta Sandy, MD;  Location: [facility];  Service: Cardiovascular;  Laterality: N/A;  ? PERIPHERAL VASCULAR THROMBECTOMY N/A 07/20/2017  ? Procedure: PERIPHERAL VASCULAR THROMBECTOMY;  Surgeon: Serafina Mitchell, MD;  Location: [facility];  Service: Cardiovascular;  Laterality: N/A;  ? PERIPHERAL VASCULAR THROMBECTOMY N/A 07/21/2017  ? Procedure: PERIPHERAL VASCULAR THROMBECTOMY - LYSIS RECHECK;  Surgeon: Waynetta Sandy, MD;  Location: [facility];  Service: Cardiovascular;  Laterality: N/A;  ? scrotum cyst    ? ? ? ? ?Review of Systems  ?Constitutional:  Negative for appetite change, chills, fatigue, fever and unexpected weight change.  ?Eyes:  Negative for visual disturbance.  ?Respiratory:  Negative for cough, chest tightness, shortness of breath and wheezing.   ?Cardiovascular:  Negative for chest pain and leg swelling.  ?Gastrointestinal:  Negative for abdominal pain, constipation, diarrhea, nausea and vomiting.  ?Genitourinary:  Negative for dysuria, flank pain, frequency, genital sores, hematuria and urgency.  ?Skin:  Negative for rash.  ?Allergic/Immunologic: Negative for immunocompromised state.  ?Neurological:  Negative for dizziness and headaches.  ?   ?Objective:  ?  ?BP 110/71   Pulse 77    Temp 99.3 ?F (37.4 ?C) (Temporal)   Wt 181 lb (82.1 kg)   SpO2 95%   BMI 27.52 kg/m?  ?Nursing note and vital signs reviewed. ? ?Physical Exam ?Constitutional:   ?   General: He is not in acute distress. ?   Appearance: He is well-developed.  ?Eyes:  ?   Conjunctiva/sclera: Conjunctivae normal.  ?Cardiovascular:  ?   Rate and Rhythm: Normal rate and regular rhythm.  ?   Heart sounds: Normal heart sounds. No murmur heard. ?  No friction rub. No gallop.  ?Pulmonary:  ?   Effort: Pulmonary effort is normal. No respiratory distress.  ?   Breath sounds: Normal breath sounds. No wheezing or rales.  ?Chest:  ?   Chest wall: No tenderness.  ?Abdominal:  ?   General: Bowel sounds are normal.  ?   Palpations: Abdomen is soft.  ?   Tenderness: There is no abdominal tenderness.  ?Musculoskeletal:  ?   Cervical back: Neck supple.  ?Lymphadenopathy:  ?   Cervical: No cervical adenopathy.  ?Skin: ?   General: Skin is warm and dry.  ?   Findings: No rash.  ?Neurological:  ?   Mental Status: He is alert and oriented to person, place, and time.  ?Psychiatric:     ?   Behavior: Behavior normal.     ?   Thought Content: Thought content normal.     ?   Judgment: Judgment normal.  ? ? ? ? ?  05/08/2021  ? 10:49 AM 04/10/2021  ? 11:20 AM 12/09/2020  ? 10:08 AM 11/29/2020  ? 11:24 AM 11/10/2020  ? 10:05 AM  ?Depression screen PHQ 2/9  ?Decreased Interest 0 0 0 0 0  ?Down, Depressed, Hopeless 0 0 0 0 0  ?PHQ - 2 Score 0 0 0 0 0  ?  ?   ?Assessment & Plan:  ? ? ?Patient Active Problem List  ? Diagnosis Date Noted  ? Essential hypertension 11/10/2020  ? Erectile dysfunction due to diseases classified elsewhere 03/26/2019  ? Rash and nonspecific skin eruption 12/12/2018  ? Hyperlipidemia associated with type 2 diabetes mellitus ([place]) 05/11/2018  ? Leg DVT (deep venous thromboembolism), chronic, right ([place]) 07/20/2017  ? Asymptomatic HIV infection ([place]) 07/19/2017  ? GERD (gastroesophageal reflux disease) 07/19/2017  ? Cough 07/15/2017  ? Thyroid  goiter 05/19/2017  ? HIV disease ([place]) 04/21/2017  ? Healthcare maintenance 04/21/2017  ? Hyperlipidemia 04/21/2017  ? ? ? ?Problem List Items Addressed This Visit   ? ?  ? Other  ? HIV disease ([place])  ?  Juan Johnston continues to have well-controlled virus with good adherence and tolerance to his ART regimen of Biktarvy.  Reviewed previous lab work and discussed plan of care.  Check blood work today.  Continue current dose of Biktarvy.  Plan for follow-up in 6 months or sooner if needed with lab work on the same day. ? ?  ?  ? Relevant Medications  ? bictegravir-emtricitabine-tenofovir AF (BIKTARVY) 50-200-25 MG TABS tablet  ? Other Relevant Orders  ? Comprehensive metabolic panel  ? HIV-1 RNA quant-no reflex-bld  ? T-helper cell (CD4)- ([facility]  clinic only)  ? Healthcare maintenance - Primary  ?  Discussed importance of safe sexual practice and condom use.  Condoms offered. ?Declines Shingrix. ?Colon cancer screening up-to-date with Cologuard. ?Due for routine dental care which he will schedule independently.  Can refer to [facility] if needed.  ?  ?  ? ? ? ?I am having Juan Johnston maintain his aspirin, hydrOXYzine, lisinopril, metFORMIN, atorvastatin, and Biktarvy. ? ? ?Meds ordered this encounter  ?Medications  ? bictegravir-emtricitabine-tenofovir AF (BIKTARVY) 50-200-25 MG TABS tablet  ?  Sig: Take 1 tablet by mouth daily.  ?  Dispense:  30 tablet  ?  Refill:  6  ?  Order Specific Question:   Supervising Provider  ?  Answer:   Carlyle Basques [[id number]]  ? ? ? ?Follow-up: Return in about 6 months (around 01/21/2022), or if symptoms worsen or fail to improve. ? ? ?Terri Piedra, MSN, FNP-C ?Nurse Practitioner ?[place] ?[facility] ?[facility] Main number: [phone] ? ? ?

## 2021-07-22 NOTE — Patient Instructions (Signed)
Nice to see you. ? ?We will check your lab work today. ? ?Continue to take your medication daily as prescribed. ? ?Refills have been sent to the pharmacy. ? ?Plan for follow up in 6 months or sooner if needed with lab work on the same day. ? ?Have a great day and stay safe! ? ?

## 2021-07-22 NOTE — Assessment & Plan Note (Signed)
Juan Johnston continues to have well-controlled virus with good adherence and tolerance to his ART regimen of Biktarvy.  Reviewed previous lab work and discussed plan of care.  Check blood work today.  Continue current dose of Biktarvy.  Plan for follow-up in 6 months or sooner if needed with lab work on the same day. ?

## 2021-07-25 LAB — COMPREHENSIVE METABOLIC PANEL
AG Ratio: 1.8 (calc) (ref 1.0–2.5)
ALT: 20 U/L (ref 9–46)
AST: 16 U/L (ref 10–35)
Albumin: 4.2 g/dL (ref 3.6–5.1)
Alkaline phosphatase (APISO): 62 U/L (ref 35–144)
BUN: 17 mg/dL (ref 7–25)
CO2: 27 mmol/L (ref 20–32)
Calcium: 9.4 mg/dL (ref 8.6–10.3)
Chloride: 107 mmol/L (ref 98–110)
Creat: 1.02 mg/dL (ref 0.70–1.28)
Globulin: 2.3 g/dL (calc) (ref 1.9–3.7)
Glucose, Bld: 103 mg/dL — ABNORMAL HIGH (ref 65–99)
Potassium: 4.3 mmol/L (ref 3.5–5.3)
Sodium: 140 mmol/L (ref 135–146)
Total Bilirubin: 0.5 mg/dL (ref 0.2–1.2)
Total Protein: 6.5 g/dL (ref 6.1–8.1)

## 2021-07-25 LAB — HELPER T-LYMPH-CD4 (ARMC ONLY)
% CD 4 Pos. Lymph.: 30.6 % — ABNORMAL LOW (ref 30.8–58.5)
Absolute CD 4 Helper: 520 /uL (ref 359–1519)
Basophils Absolute: 0 10*3/uL (ref 0.0–0.2)
Basos: 1 %
EOS (ABSOLUTE): 0.1 10*3/uL (ref 0.0–0.4)
Eos: 2 %
Hematocrit: 43.3 % (ref 37.5–51.0)
Hemoglobin: 14.8 g/dL (ref 13.0–17.7)
Immature Grans (Abs): 0 10*3/uL (ref 0.0–0.1)
Immature Granulocytes: 0 %
Lymphocytes Absolute: 1.7 10*3/uL (ref 0.7–3.1)
Lymphs: 34 %
MCH: 33.3 pg — ABNORMAL HIGH (ref 26.6–33.0)
MCHC: 34.2 g/dL (ref 31.5–35.7)
MCV: 97 fL (ref 79–97)
Monocytes Absolute: 0.4 10*3/uL (ref 0.1–0.9)
Monocytes: 7 %
Neutrophils Absolute: 2.9 10*3/uL (ref 1.4–7.0)
Neutrophils: 56 %
Platelets: 188 10*3/uL (ref 150–450)
RBC: 4.45 x10E6/uL (ref 4.14–5.80)
RDW: 12.9 % (ref 11.6–15.4)
WBC: 5.2 10*3/uL (ref 3.4–10.8)

## 2021-07-25 LAB — HIV-1 RNA QUANT-NO REFLEX-BLD
HIV 1 RNA Quant: NOT DETECTED copies/mL
HIV-1 RNA Quant, Log: NOT DETECTED Log copies/mL

## 2021-07-28 LAB — T-HELPER CELL (CD4) - (RCID CLINIC ONLY)

## 2021-08-05 ENCOUNTER — Encounter (INDEPENDENT_AMBULATORY_CARE_PROVIDER_SITE_OTHER): Payer: Self-pay | Admitting: Primary Care

## 2021-08-05 ENCOUNTER — Ambulatory Visit (INDEPENDENT_AMBULATORY_CARE_PROVIDER_SITE_OTHER): Payer: Medicare Other | Admitting: Primary Care

## 2021-08-05 VITALS — BP 138/81 | HR 75 | Temp 98.0°F | Ht 68.0 in | Wt 180.0 lb

## 2021-08-05 DIAGNOSIS — F17210 Nicotine dependence, cigarettes, uncomplicated: Secondary | ICD-10-CM | POA: Diagnosis not present

## 2021-08-05 DIAGNOSIS — E782 Mixed hyperlipidemia: Secondary | ICD-10-CM | POA: Diagnosis not present

## 2021-08-05 DIAGNOSIS — B20 Human immunodeficiency virus [HIV] disease: Secondary | ICD-10-CM

## 2021-08-05 DIAGNOSIS — E1100 Type 2 diabetes mellitus with hyperosmolarity without nonketotic hyperglycemic-hyperosmolar coma (NKHHC): Secondary | ICD-10-CM | POA: Diagnosis not present

## 2021-08-05 DIAGNOSIS — F17200 Nicotine dependence, unspecified, uncomplicated: Secondary | ICD-10-CM

## 2021-08-05 DIAGNOSIS — E0800 Diabetes mellitus due to underlying condition with hyperosmolarity without nonketotic hyperglycemic-hyperosmolar coma (NKHHC): Secondary | ICD-10-CM

## 2021-08-05 DIAGNOSIS — I10 Essential (primary) hypertension: Secondary | ICD-10-CM

## 2021-08-05 LAB — POCT GLYCOSYLATED HEMOGLOBIN (HGB A1C): Hemoglobin A1C: 6.3 % — AB (ref 4.0–5.6)

## 2021-08-05 MED ORDER — LISINOPRIL 40 MG PO TABS: 40.0000 mg | ORAL_TABLET | Freq: Every day | ORAL | 1 refills | Status: DC

## 2021-08-05 MED ORDER — METFORMIN HCL ER 500 MG PO TB24: 500.0000 mg | ORAL_TABLET | Freq: Every day | ORAL | 1 refills | Status: DC
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
## 2021-10-13 ENCOUNTER — Other Ambulatory Visit (INDEPENDENT_AMBULATORY_CARE_PROVIDER_SITE_OTHER): Payer: Self-pay | Admitting: Primary Care

## 2021-10-13 DIAGNOSIS — I10 Essential (primary) hypertension: Secondary | ICD-10-CM

## 2021-10-13 DIAGNOSIS — E0800 Diabetes mellitus due to underlying condition with hyperosmolarity without nonketotic hyperglycemic-hyperosmolar coma (NKHHC): Secondary | ICD-10-CM

## 2021-10-13 NOTE — Telephone Encounter (Signed)
Medication Refill - Medication: lisinopril (ZESTRIL) 40 MG tablet /metformin '500mg'$  Patient is out of these meds Has the patient contacted their pharmacy? yes (Agent: If no, request that the patient contact the pharmacy for the refill. If patient does not wish to contact the pharmacy document the reason why and proceed with request.) (Agent: If yes, when and what did the pharmacy advise?)contact pcp  Preferred Pharmacy (with phone number or street name):  [id number] - [address], [place] Phone:  [phone]  Fax:  [phone]     Has the patient been seen for an appointment in the last year OR does the patient have an upcoming appointment? yes  Agent: Please be advised that RX refills may take up to 3 business days. We ask that you follow-up with your pharmacy.

## 2021-10-14 ENCOUNTER — Ambulatory Visit: Payer: Medicare Other

## 2021-10-14 ENCOUNTER — Other Ambulatory Visit: Payer: Self-pay

## 2021-10-14 NOTE — Telephone Encounter (Signed)
Requested medication (s) are due for refill today: no  Requested medication (s) are on the active medication list: yes  Last refill:  08/06/21  Future visit scheduled: yes  Notes to clinic:  Unable to refill per protocol, Rx request is too soon. Last refill 08/06/21 for 90 and 1 refill. Pt should have enough until October.     Requested Prescriptions  Pending Prescriptions Disp Refills   lisinopril (ZESTRIL) 40 MG tablet 90 tablet 1    Sig: Take 1 tablet (40 mg total) by mouth daily.     Cardiovascular:  ACE Inhibitors Passed - 10/13/2021  2:57 PM      Passed - Cr in normal range and within 180 days    Creat  Date Value Ref Range Status  07/22/2021 1.02 0.70 - 1.28 mg/dL Final         Passed - K in normal range and within 180 days    Potassium  Date Value Ref Range Status  07/22/2021 4.3 3.5 - 5.3 mmol/L Final         Passed - Patient is not pregnant      Passed - Last BP in normal range    BP Readings from Last 1 Encounters:  08/05/21 138/81         Passed - Valid encounter within last 6 months    Recent Outpatient Visits           2 months ago Diabetes mellitus due to underlying condition with hyperosmolarity without coma, without long-term current use of insulin ([place])   [place], Michelle P, NP   5 months ago Hyperlipidemia associated with type 2 diabetes mellitus ([place])   [place], Michelle P, NP   6 months ago Prediabetes   [place], [place], NP   10 months ago Essential hypertension   [place], [place], NP   11 months ago Essential hypertension   [place] Fenton Foy, NP       Future Appointments             In 3 months Calone, Ples Specter, [place] for Infectious Disease, [facility]   In 3 months Oletta Lamas, [place] Cage, NP [facility]              metFORMIN (GLUCOPHAGE XR) 500 MG 24 hr tablet 90 tablet 1    Sig: Take 1 tablet (500 mg total) by mouth daily with breakfast.     Endocrinology:  Diabetes - Biguanides Failed - 10/13/2021  2:57 PM      Failed - B12 Level in normal range and within 720 days    No results found for: "VITAMINB12"       Failed - CBC within normal limits and completed in the last 12 months    WBC  Date Value Ref Range Status  07/22/2021 5.2 3.4 - 10.8 x10E3/uL Final  06/17/2020 5.4 3.8 - 10.8 Thousand/uL Final   RBC  Date Value Ref Range Status  07/22/2021 4.45 4.14 - 5.80 x10E6/uL Final  06/17/2020 4.77 4.20 - 5.80 Million/uL Final   Hemoglobin  Date Value Ref Range Status  07/22/2021 14.8 13.0 - 17.7 g/dL Final   Hematocrit  Date Value Ref Range Status  07/22/2021 43.3 37.5 - 51.0 % Final   MCHC  Date Value Ref Range Status  07/22/2021 34.2 31.5 - 35.7  g/dL Final  06/17/2020 34.3 32.0 - 36.0 g/dL Final   [facility]  Date Value Ref Range Status  07/22/2021 33.3 (H) 26.6 - 33.0 pg Final  06/17/2020 33.1 (H) 27.0 - 33.0 pg Final   MCV  Date Value Ref Range Status  07/22/2021 97 79 - 97 fL Final   No results found for: "PLTCOUNTKUC", "LABPLAT", "POCPLA" RDW  Date Value Ref Range Status  07/22/2021 12.9 11.6 - 15.4 % Final         Passed - Cr in normal range and within 360 days    Creat  Date Value Ref Range Status  07/22/2021 1.02 0.70 - 1.28 mg/dL Final         Passed - HBA1C is between 0 and 7.9 and within 180 days    Hemoglobin A1C  Date Value Ref Range Status  08/05/2021 6.3 (A) 4.0 - 5.6 % Final   HbA1c, POC (prediabetic range)  Date Value Ref Range Status  05/10/2018 6.2 5.7 - 6.4 % Final   Hgb A1c MFr Bld  Date Value Ref Range Status  06/06/2019 6.5 (H) 4.8 - 5.6 % Final    Comment:             Prediabetes: 5.7 - 6.4          Diabetes: >6.4          Glycemic control for adults with diabetes: <7.0          Passed - eGFR in normal range and within 360 days    GFR, Est  African American  Date Value Ref Range Status  06/17/2020 73 > OR = 60 mL/min/1.34m Final   GFR, Est Non African American  Date Value Ref Range Status  06/17/2020 63 > OR = 60 mL/min/1.753mFinal   eGFR  Date Value Ref Range Status  05/08/2021 87 >59 mL/min/1.73 Final         Passed - Valid encounter within last 6 months    Recent Outpatient Visits           2 months ago Diabetes mellitus due to underlying condition with hyperosmolarity without coma, without long-term current use of insulin ([place]  [place], NP   5 months ago Hyperlipidemia associated with type 2 diabetes mellitus ([facility]  [facility] EdKerin PernaNP   6 months ago Prediabetes   [facility]in PernaNP   10 months ago Essential hypertension   [facility]le P, NP   11 months ago Essential hypertension   [facility]enton FoyNP       Future Appointments             In 3 months Calone, GrPles SpecterF[place]villeor Infectious Disease, [facility]   In 3 months EdOletta LamasMiMilford CageNP [facility]

## 2021-12-03 ENCOUNTER — Other Ambulatory Visit: Payer: Self-pay | Admitting: Family

## 2021-12-03 DIAGNOSIS — B20 Human immunodeficiency virus [HIV] disease: Secondary | ICD-10-CM

## 2022-02-01 ENCOUNTER — Other Ambulatory Visit: Payer: Self-pay

## 2022-02-01 ENCOUNTER — Encounter: Payer: Self-pay | Admitting: Family

## 2022-02-01 ENCOUNTER — Ambulatory Visit (INDEPENDENT_AMBULATORY_CARE_PROVIDER_SITE_OTHER): Payer: Medicare Other | Admitting: Family

## 2022-02-01 VITALS — BP 109/68 | HR 81 | Temp 98.1°F | Ht 67.0 in | Wt 176.0 lb

## 2022-02-01 DIAGNOSIS — Z79899 Other long term (current) drug therapy: Secondary | ICD-10-CM

## 2022-02-01 DIAGNOSIS — Z0000 Encounter for general adult medical examination without abnormal findings: Secondary | ICD-10-CM

## 2022-02-01 DIAGNOSIS — B20 Human immunodeficiency virus [HIV] disease: Secondary | ICD-10-CM | POA: Diagnosis not present

## 2022-02-01 DIAGNOSIS — Z23 Encounter for immunization: Secondary | ICD-10-CM | POA: Diagnosis not present

## 2022-02-01 MED ORDER — BIKTARVY 50-200-25 MG PO TABS: 1.0000 | ORAL_TABLET | Freq: Every day | ORAL | 6 refills | Status: DC

## 2022-02-01 NOTE — Patient Instructions (Addendum)
Nice to see you.  We will check your lab work today.  Continue to take your medication daily as prescribed.  Refills have been sent to the pharmacy.  Please call [facility] to schedule/follow up on your dental care at [phone]  Plan for follow up in 6 months or sooner if needed with lab work 1-2 weeks prior to appointment.   Have a great day and stay safe!

## 2022-02-01 NOTE — Progress Notes (Unsigned)
Brief Narrative   Patient ID: Juan Johnston, male    DOB: May 09, 1950, 71 y.o.   MRN: [id number]  [name] is a 71 year old African-American male diagnosed with HIV disease in December 2018 with risk factor of MSM.  Initial CD4 count of 70 8 and viral load of 1.2 million.  Initial genotype performed on 04/21/2017 with K103 (efavirenz and nevirapine) and E138G (rilpivirine).  [id number] negative.  Initial regimen of Symtuza.  Entered care at [facility] stage III.  Now on Biktarvy.    Subjective:    Chief Complaint  Patient presents with   Follow-up    HPI:  Juan Johnston is a 71 y.o. male with HIV disease last seen on 07/22/2021 with well-controlled virus and good adherence and tolerance to Biktarvy.  Viral load was undetectable with CD4 count of 520.  Kidney function, liver function, electrolytes within normal ranges.  Here today for routine follow-up.  [name] has been doing well since his last office visit with no new concerns/complaints.  Continues to take his Biktarvy as prescribed with no adverse side effects and no problems obtaining medication from the pharmacy.  Healthcare maintenance due includes influenza vaccine and Shingrix.  Condoms and STD testing offered.  Requesting dental referral to clinic dental service.   Denies fevers, chills, night sweats, headaches, changes in vision, neck pain/stiffness, nausea, diarrhea, vomiting, lesions or rashes.  No Known Allergies    Outpatient Medications Prior to Visit  Medication Sig Dispense Refill   aspirin 81 MG chewable tablet Chew 1 tablet (81 mg total) by mouth daily. 30 tablet 0   atorvastatin (LIPITOR) 20 MG tablet Take 1 tablet (20 mg total) by mouth daily. 90 tablet 3   lisinopril (ZESTRIL) 40 MG tablet Take 1 tablet (40 mg total) by mouth daily. 90 tablet 1   metFORMIN (GLUCOPHAGE XR) 500 MG 24 hr tablet Take 1 tablet (500 mg total) by mouth daily with breakfast. 90 tablet 1   bictegravir-emtricitabine-tenofovir AF  (BIKTARVY) 50-200-25 MG TABS tablet Take 1 tablet by mouth daily. 30 tablet 6   No facility-administered medications prior to visit.     Past Medical History:  Diagnosis Date   HIV infection ([place])    Thyroid goiter      Past Surgical History:  Procedure Laterality Date   INTRAVASCULAR ULTRASOUND/IVUS N/A 07/21/2017   Procedure: Intravascular Ultrasound/IVUS;  Surgeon: Juan Sandy, MD;  Location: [facility];  Service: Cardiovascular;  Laterality: N/A;   PERIPHERAL VASCULAR THROMBECTOMY N/A 07/20/2017   Procedure: PERIPHERAL VASCULAR THROMBECTOMY;  Surgeon: Juan Mitchell, MD;  Location: [place] [facility];  Service: Cardiovascular;  Laterality: N/A;   PERIPHERAL VASCULAR THROMBECTOMY N/A 07/21/2017   Procedure: PERIPHERAL VASCULAR THROMBECTOMY - LYSIS RECHECK;  Surgeon: Juan Sandy, MD;  Location: [place] [facility];  Service: Cardiovascular;  Laterality: N/A;   scrotum cyst        Review of Systems  Constitutional:  Negative for appetite change, chills, fatigue, fever and unexpected weight change.  Eyes:  Negative for visual disturbance.  Respiratory:  Negative for cough, chest tightness, shortness of breath and wheezing.   Cardiovascular:  Negative for chest pain and leg swelling.  Gastrointestinal:  Negative for abdominal pain, constipation, diarrhea, nausea and vomiting.  Genitourinary:  Negative for dysuria, flank pain, frequency, genital sores, hematuria and urgency.  Skin:  Negative for rash.  Allergic/Immunologic: Negative for immunocompromised state.  Neurological:  Negative for dizziness and headaches.      Objective:  BP 109/68   Pulse 81   Temp 98.1 F (36.7 C) (Temporal)   Ht '5\' 7"'$  (1.702 m)   Wt 176 lb (79.8 kg)   SpO2 96%   BMI 27.57 kg/m  Nursing note and vital signs reviewed.  Physical Exam Constitutional:      General: He is not in acute distress.    Appearance: He is well-developed.  Eyes:      Conjunctiva/sclera: Conjunctivae normal.  Cardiovascular:     Rate and Rhythm: Normal rate and regular rhythm.     Heart sounds: Normal heart sounds. No murmur heard.    No friction rub. No gallop.  Pulmonary:     Effort: Pulmonary effort is normal. No respiratory distress.     Breath sounds: Normal breath sounds. No wheezing or rales.  Chest:     Chest wall: No tenderness.  Abdominal:     General: Bowel sounds are normal.     Palpations: Abdomen is soft.     Tenderness: There is no abdominal tenderness.  Musculoskeletal:     Cervical back: Neck supple.  Lymphadenopathy:     Cervical: No cervical adenopathy.  Skin:    General: Skin is warm and dry.     Findings: No rash.  Neurological:     Mental Status: He is alert and oriented to person, place, and time.  Psychiatric:        Behavior: Behavior normal.        Thought Content: Thought content normal.        Judgment: Judgment normal.         02/01/2022    2:38 PM 08/05/2021    1:39 PM 05/08/2021   10:49 AM 04/10/2021   11:20 AM 12/09/2020   10:08 AM  Depression screen PHQ 2/9  Decreased Interest 0 0 0 0 0  Down, Depressed, Hopeless 0 0 0 0 0  PHQ - 2 Score 0 0 0 0 0       Assessment & Plan:    Patient Active Problem List   Diagnosis Date Noted   Essential hypertension 11/10/2020   Erectile dysfunction due to diseases classified elsewhere 03/26/2019   Rash and nonspecific skin eruption 12/12/2018   Hyperlipidemia associated with type 2 diabetes mellitus ([place]) 05/11/2018   Leg DVT (deep venous thromboembolism), chronic, right ([place]) 07/20/2017   Asymptomatic HIV infection ([place]) 07/19/2017   GERD (gastroesophageal reflux disease) 07/19/2017   Cough 07/15/2017   Thyroid goiter 05/19/2017   HIV disease ([place]) 04/21/2017   Healthcare maintenance 04/21/2017   Hyperlipidemia 04/21/2017     Problem List Items Addressed This Visit       Other   HIV disease [facility])    Mr. Canal has well-controlled virus with good  adherence and tolerance to Biktarvy.  Reviewed previous lab work and discussed plan of care.  Check lab work today.  Continue current dose of Biktarvy.  Plan for follow-up in 6 months or sooner if needed with lab work on the same day.      Relevant Medications   bictegravir-emtricitabine-tenofovir AF (BIKTARVY) 50-200-25 MG TABS tablet   Other Relevant Orders   Comprehensive metabolic panel (Completed)   HIV-1 RNA quant-no reflex-bld   T-helper cell (CD4)- ([facility] clinic only)   AMB REFERRAL TO [place] maintenance    Discussed importance of safe sexual practices and condom use.  Condoms and STD testing offered. High-dose influenza vaccine provided. Referral placed to dental clinic  Other Visit Diagnoses     Pharmacologic therapy    -  Primary   Relevant Orders   Lipid panel (Completed)   Need for immunization against influenza       Relevant Orders   Flu Vaccine QUAD High Dose(Fluad) (Completed)        I am having Stevie Kern maintain his aspirin, atorvastatin, lisinopril, metFORMIN, and Biktarvy.   Meds ordered this encounter  Medications   bictegravir-emtricitabine-tenofovir AF (BIKTARVY) 50-200-25 MG TABS tablet    Sig: Take 1 tablet by mouth daily.    Dispense:  30 tablet    Refill:  6    Order Specific Question:   Supervising Provider    Answer:   Carlyle Basques [[id number]]     Follow-up: Return in about 6 months (around 08/03/2022).   Terri Piedra, MSN, FNP-C Nurse Practitioner [facility] for Infectious Disease [place] number: [phone]

## 2022-02-02 LAB — T-HELPER CELL (CD4) - (RCID CLINIC ONLY)
CD4 % Helper T Cell: 28 % — ABNORMAL LOW (ref 33–65)
CD4 T Cell Abs: 454 /uL (ref 400–1790)

## 2022-02-02 NOTE — Assessment & Plan Note (Signed)
   Discussed importance of safe sexual practices and condom use.  Condoms and STD testing offered.  High-dose influenza vaccine provided.  Referral placed to dental clinic

## 2022-02-02 NOTE — Assessment & Plan Note (Signed)
Juan Johnston has well-controlled virus with good adherence and tolerance to Biktarvy.  Reviewed previous lab work and discussed plan of care.  Check lab work today.  Continue current dose of Biktarvy.  Plan for follow-up in 6 months or sooner if needed with lab work on the same day.

## 2022-02-03 ENCOUNTER — Other Ambulatory Visit (INDEPENDENT_AMBULATORY_CARE_PROVIDER_SITE_OTHER): Payer: Self-pay | Admitting: Primary Care

## 2022-02-03 LAB — COMPREHENSIVE METABOLIC PANEL
AG Ratio: 1.7 (calc) (ref 1.0–2.5)
ALT: 29 U/L (ref 9–46)
AST: 17 U/L (ref 10–35)
Albumin: 4.3 g/dL (ref 3.6–5.1)
Alkaline phosphatase (APISO): 70 U/L (ref 35–144)
BUN: 23 mg/dL (ref 7–25)
CO2: 23 mmol/L (ref 20–32)
Calcium: 9.6 mg/dL (ref 8.6–10.3)
Chloride: 106 mmol/L (ref 98–110)
Creat: 1.12 mg/dL (ref 0.70–1.28)
Globulin: 2.5 g/dL (calc) (ref 1.9–3.7)
Glucose, Bld: 81 mg/dL (ref 65–99)
Potassium: 4.2 mmol/L (ref 3.5–5.3)
Sodium: 139 mmol/L (ref 135–146)
Total Bilirubin: 0.6 mg/dL (ref 0.2–1.2)
Total Protein: 6.8 g/dL (ref 6.1–8.1)

## 2022-02-03 LAB — LIPID PANEL
Cholesterol: 195 mg/dL (ref <200)
HDL: 41 mg/dL (ref >=40)
LDL Cholesterol (Calc): 124 mg/dL (calc) — ABNORMAL HIGH
Non-HDL Cholesterol (Calc): 154 mg/dL (calc) — ABNORMAL HIGH (ref <130)
Total CHOL/HDL Ratio: 4.8 (calc) (ref <5.0)
Triglycerides: 182 mg/dL — ABNORMAL HIGH (ref <150)

## 2022-02-03 LAB — HIV-1 RNA QUANT-NO REFLEX-BLD
HIV 1 RNA Quant: NOT DETECTED Copies/mL
HIV-1 RNA Quant, Log: NOT DETECTED Log cps/mL

## 2022-02-03 MED ORDER — ATORVASTATIN CALCIUM 40 MG PO TABS: 40.0000 mg | ORAL_TABLET | Freq: Every day | ORAL | 1 refills | Status: DC

## 2022-02-08 ENCOUNTER — Encounter (INDEPENDENT_AMBULATORY_CARE_PROVIDER_SITE_OTHER): Payer: Self-pay | Admitting: Primary Care

## 2022-02-08 ENCOUNTER — Ambulatory Visit (INDEPENDENT_AMBULATORY_CARE_PROVIDER_SITE_OTHER): Payer: Medicare Other | Admitting: Primary Care

## 2022-02-08 VITALS — BP 150/83 | HR 71 | Resp 16 | Ht 67.0 in | Wt 179.6 lb

## 2022-02-08 DIAGNOSIS — E119 Type 2 diabetes mellitus without complications: Secondary | ICD-10-CM

## 2022-02-08 DIAGNOSIS — I10 Essential (primary) hypertension: Secondary | ICD-10-CM

## 2022-02-08 DIAGNOSIS — E782 Mixed hyperlipidemia: Secondary | ICD-10-CM

## 2022-02-08 DIAGNOSIS — F17200 Nicotine dependence, unspecified, uncomplicated: Secondary | ICD-10-CM | POA: Diagnosis not present

## 2022-02-08 MED ORDER — METFORMIN HCL ER 500 MG PO TB24: 500.0000 mg | ORAL_TABLET | Freq: Every day | ORAL | 1 refills | Status: DC

## 2022-02-08 NOTE — Progress Notes (Signed)
Subjective:  Patient ID: Juan Johnston, male    DOB: 03-Jan-1951  Age: 71 y.o. MRN: [id number]  CC: Diabetes    HPI Juan Johnston presents for follow-up of diabetes. Patient does not check blood sugar at home  Compliant with meds - Yes Checking CBGs? No  Fasting avg -   Postprandial average -  Exercising regularly? - Yes Juan Johnston Watching carbohydrate intake? - sometimes Neuropathy ? - Yes Hypoglycemic events - No  - Recovers with :   Pertinent ROS:  Polyuria - No Polydipsia - No Vision problems - No  Management of HTN- Bp elevated at this visit . Reviewed previous Bp wnl. Patient has No headache, No chest pain, No abdominal pain - No Nausea, No new weakness tingling or numbness, No Cough - shortness of breath  Medications as noted below. Taking them regularly without complication/adverse reaction being reported today.   History Juan Johnston has a past medical history of HIV infection ([place]) and Thyroid goiter.   Juan Johnston has a past surgical history that includes scrotum cyst; PERIPHERAL VASCULAR THROMBECTOMY (N/A, 07/20/2017); PERIPHERAL VASCULAR THROMBECTOMY (N/A, 07/21/2017); and Intravascular Ultrasound/IVUS (N/A, 07/21/2017).   His family history includes Hypertension in his mother.Juan Johnston reports that Juan Johnston has been smoking cigarettes. Juan Johnston has been smoking an average of .3 packs per day. Juan Johnston has never used smokeless tobacco. Juan Johnston reports current alcohol use. Juan Johnston reports that Juan Johnston does not use drugs.  Current Outpatient Medications on File Prior to Visit  Medication Sig Dispense Refill   aspirin 81 MG chewable tablet Chew 1 tablet (81 mg total) by mouth daily. 30 tablet 0   atorvastatin (LIPITOR) 40 MG tablet Take 1 tablet (40 mg total) by mouth daily. 90 tablet 1   bictegravir-emtricitabine-tenofovir AF (BIKTARVY) 50-200-25 MG TABS tablet Take 1 tablet by mouth daily. 30 tablet 6   lisinopril (ZESTRIL) 40 MG tablet Take 1 tablet (40 mg total) by mouth daily. 90 tablet 1   No current  facility-administered medications on file prior to visit.    ROS Comprehensive ROS Pertinent positive and negative noted in HPI    Objective:    BP Readings from Last 3 Encounters:  02/08/22 (!) 150/83  02/01/22 109/68  08/05/21 138/81    Wt Readings from Last 3 Encounters:  02/08/22 179 lb 9.6 oz (81.5 kg)  02/01/22 176 lb (79.8 kg)  08/05/21 180 lb (81.6 kg)    Physical Exam Vitals reviewed.  Constitutional:      Appearance: Normal appearance.  HENT:     Right Ear: Tympanic membrane and external ear normal.     Left Ear: Tympanic membrane and external ear normal.     Nose: Nose normal.  Cardiovascular:     Rate and Rhythm: Normal rate and regular rhythm.  Pulmonary:     Breath sounds: Normal breath sounds.  Abdominal:     General: Bowel sounds are normal.     Palpations: Abdomen is soft.  Musculoskeletal:        General: Normal range of motion.     Cervical back: Normal range of motion.  Skin:    General: Skin is warm and dry.  Neurological:     Mental Status: Juan Johnston is alert and oriented to person, place, and time.  Psychiatric:        Mood and Affect: Mood normal.        Behavior: Behavior normal.    Lab Results  Component Value Date   HGBA1C 6.3 (A) 08/05/2021   HGBA1C 6.8 (A) 04/10/2021  HGBA1C 6.5 (A) 09/05/2020    Lab Results  Component Value Date   WBC 5.2 07/22/2021   HGB 14.8 07/22/2021   HCT 43.3 07/22/2021   PLT 188 07/22/2021   GLUCOSE 81 02/01/2022   CHOL 195 02/01/2022   TRIG 182 (H) 02/01/2022   HDL 41 02/01/2022   LDLCALC 124 (H) 02/01/2022   ALT 29 02/01/2022   AST 17 02/01/2022   NA 139 02/01/2022   K 4.2 02/01/2022   CL 106 02/01/2022   CREATININE 1.12 02/01/2022   BUN 23 02/01/2022   CO2 23 02/01/2022   TSH 1.590 09/05/2020   HGBA1C 6.3 (A) 08/05/2021     Assessment & Plan:  Diagnoses and all orders for this visit:  Essential hypertension BP goal - < 140/90 Explained that having normal blood pressure is the goal  and medications are helping to get to goal and maintain normal blood pressure. DIET: Limit salt intake, read nutrition labels to check salt content, limit fried and high fatty foods  Avoid using multisymptom OTC cold preparations that generally contain sudafed which can rise BP. Consult with pharmacist on best cold relief products to use for persons with HTN EXERCISE Discussed incorporating exercise such as walking - 30 minutes most days of the week and can do in 10 minute intervals     Mixed hyperlipidemia On atorvastatin '40mg'$   High cholesterol may increase risk of heart attack and/or stroke. Consider eating more fruits, vegetables, and lean baked meats such as chicken or fish. Moderate intensity exercise at least 150 minutes as tolerated per week may help as well.    Type 2 diabetes mellitus without complication, without long-term current use of insulin (HCC) A1C 6.1 previously 6.3 continue on Metformin '500mg'$  XR daily. - educated on lifestyle modifications, including but not limited to diet choices and adding exercise to daily routine.       Ambulatory referral to Ophthalmology -     Microalbumin, urine future  Tobacco dependence - I have recommended complete cessation of tobacco use. I have discussed various options available for assistance with tobacco cessation including over the counter methods (Nicotine gum, patch and lozenges). We also discussed prescription options (Chantix, Nicotine Inhaler / Nasal Spray). The patient is not interested in pursuing any prescription tobacco cessation options at this time. - Patient declines at this time.  - Less than 5 minutes spent on counseling.    I am having Juan Johnston maintain his aspirin, lisinopril, Biktarvy, atorvastatin, and metFORMIN.  Meds ordered this encounter  Medications   metFORMIN (GLUCOPHAGE XR) 500 MG 24 hr tablet    Sig: Take 1 tablet (500 mg total) by mouth daily with breakfast.    Dispense:  90 tablet    Refill:  1     Order Specific Question:   Supervising Provider    Answer:   Tresa Garter [id number]     Follow-up:   Return in about 6 months (around 08/09/2022) for DM.  The above assessment and management plan was discussed with the patient. The patient verbalized understanding of and has agreed to the management plan. Patient is aware to call the clinic if symptoms fail to improve or worsen. Patient is aware when to return to the clinic for a follow-up visit. Patient educated on when it is appropriate to go to the emergency department.   Juluis Mire, NP-C

## 2022-02-10 ENCOUNTER — Other Ambulatory Visit (INDEPENDENT_AMBULATORY_CARE_PROVIDER_SITE_OTHER): Payer: Self-pay | Admitting: Primary Care

## 2022-02-10 DIAGNOSIS — I10 Essential (primary) hypertension: Secondary | ICD-10-CM

## 2022-03-23 ENCOUNTER — Telehealth: Payer: Self-pay | Admitting: Family

## 2022-03-23 ENCOUNTER — Other Ambulatory Visit: Payer: Self-pay

## 2022-03-23 ENCOUNTER — Encounter: Payer: Self-pay | Admitting: Infectious Diseases

## 2022-03-23 ENCOUNTER — Ambulatory Visit (INDEPENDENT_AMBULATORY_CARE_PROVIDER_SITE_OTHER): Payer: Medicare Other | Admitting: Infectious Diseases

## 2022-03-23 VITALS — BP 133/78 | HR 72 | Temp 97.7°F | Ht 67.0 in | Wt 177.0 lb

## 2022-03-23 DIAGNOSIS — B029 Zoster without complications: Secondary | ICD-10-CM | POA: Diagnosis not present

## 2022-03-23 DIAGNOSIS — Z8619 Personal history of other infectious and parasitic diseases: Secondary | ICD-10-CM

## 2022-03-23 DIAGNOSIS — L259 Unspecified contact dermatitis, unspecified cause: Secondary | ICD-10-CM

## 2022-03-23 MED ORDER — VALACYCLOVIR HCL 1 G PO TABS
1000.0000 mg | ORAL_TABLET | Freq: Three times a day (TID) | ORAL | 0 refills | Status: AC
  Filled 2022-03-23: qty 21, 7d supply, fill #0

## 2022-03-23 MED ORDER — TRIAMCINOLONE ACETONIDE 0.5 % EX OINT
1.0000 | TOPICAL_OINTMENT | Freq: Two times a day (BID) | CUTANEOUS | 0 refills | Status: DC
  Filled 2022-03-23: qty 30, 15d supply, fill #0

## 2022-03-23 MED ORDER — ZOSTER VAC RECOMB ADJUVANTED 50 MCG/0.5ML IM SUSR: 0.5000 mL | INTRAMUSCULAR | 1 refills | Status: DC

## 2022-03-23 NOTE — Telephone Encounter (Signed)
Returned patient's call. Patient states he developed a rash a few days ago and suspects he could possibly have shingles. States rash to right side of back to front of chest is now dry.  1.  APPEARANCE of RASH: patient states rash started as cluster of red bumps 2.  LOCATION: started on his back, then around right side to chest 3   ONSET: about 3 days ago 4.   ITCHING: denies any itching 5.   PAIN: currently denies pain to area  Patient agrees to appointment with Doren Custard, NP for acute visit. Eugenia Mcalpine, LPN

## 2022-03-23 NOTE — Progress Notes (Signed)
Name: Juan Johnston  DOB: Nov 09, 1950 MRN: [id number] PCP: Juan Perna, NP     Subjective:   Chief Complaint  Patient presents with   Follow-up      HPI: Mr. Johnston is here for work in visit today with concern over shingles like rash that is new. Started with a bumpy rash that was a little uncomfortable on the back that has since spread to the back of the right arm and a patch on his right chest.   He has well controlled HIV infection on once daily biktarvy with recent labs October 2023 with undetectable viral load and CD4 454. Regularly follows with Juan Johnston the last few years. At this visit they discussed Shingrix and Flu vaccines.    Review of Systems  Constitutional:  Negative for chills and fever.  HENT: Negative.    Eyes: Negative.   Respiratory: Negative.    Cardiovascular: Negative.   Genitourinary: Negative.   Musculoskeletal: Negative.   Skin:  Positive for itching and rash.  Neurological: Negative.      Past Medical History:  Diagnosis Date   HIV infection ([place])    Thyroid goiter     Outpatient Medications Prior to Visit  Medication Sig Dispense Refill   aspirin 81 MG chewable tablet Chew 1 tablet (81 mg total) by mouth daily. 30 tablet 0   atorvastatin (LIPITOR) 40 MG tablet Take 1 tablet (40 mg total) by mouth daily. 90 tablet 1   bictegravir-emtricitabine-tenofovir AF (BIKTARVY) 50-200-25 MG TABS tablet Take 1 tablet by mouth daily. 30 tablet 6   lisinopril (ZESTRIL) 40 MG tablet TAKE 1 TABLET(40 MG) BY MOUTH DAILY 90 tablet 1   metFORMIN (GLUCOPHAGE XR) 500 MG 24 hr tablet Take 1 tablet (500 mg total) by mouth daily with breakfast. 90 tablet 1   No facility-administered medications prior to visit.     No Known Allergies  Social History   Tobacco Use   Smoking status: Some Days    Packs/day: 0.30    Types: Cigarettes    Last attempt to quit: 02/03/2017    Years since quitting: 5.1   Smokeless tobacco: Never   Tobacco comments:     "Does not inhale"; states working on cutting back  Vaping Use   Vaping Use: Never used  Substance Use Topics   Alcohol use: Yes    Comment: occasional   Drug use: No    Family History  Problem Relation Age of Onset   Hypertension Mother     Social History   Substance and Sexual Activity  Sexual Activity Yes   Partners: Male   Birth control/protection: None   Comment: declined condoms     Objective:   Vitals:   03/23/22 1345  BP: 133/78  Pulse: 72  Temp: 97.7 F (36.5 C)  TempSrc: Temporal  SpO2: 96%  Weight: 177 lb (80.3 kg)  Height: '5\' 7"'$  (1.702 Juan)   Body mass index is 27.72 kg/Juan.  Physical Exam Vitals reviewed.  Constitutional:      Appearance: Normal appearance. He is not ill-appearing.  Cardiovascular:     Rate and Rhythm: Normal rate.  Pulmonary:     Effort: Pulmonary effort is normal.  Skin:    Findings: Rash present.     Comments: Lower abdomen with hypopigmented scaled rash w/o signs of acute infection.   Neurological:     Mental Status: He is alert.          Lab Results Lab Results  Component Value Date  WBC 5.2 07/22/2021   HGB 14.8 07/22/2021   HCT 43.3 07/22/2021   MCV 97 07/22/2021   PLT 188 07/22/2021    Lab Results  Component Value Date   CREATININE 1.12 02/01/2022   BUN 23 02/01/2022   NA 139 02/01/2022   K 4.2 02/01/2022   CL 106 02/01/2022   CO2 23 02/01/2022    Lab Results  Component Value Date   ALT 29 02/01/2022   AST 17 02/01/2022   ALKPHOS 77 05/08/2021   BILITOT 0.6 02/01/2022    Lab Results  Component Value Date   CHOL 195 02/01/2022   HDL 41 02/01/2022   LDLCALC 124 (H) 02/01/2022   TRIG 182 (H) 02/01/2022   CHOLHDL 4.8 02/01/2022   HIV 1 RNA Quant  Date Value  02/01/2022 Not Detected Copies/mL  07/22/2021 NOT DETECTED copies/mL  01/07/2021 Not Detected Copies/mL   CD4 T Cell Abs (/uL)  Date Value  02/01/2022 454  01/07/2021 410  06/17/2020 438     Assessment & Plan:   Problem List  Items Addressed This Visit       Unprioritized   Contact dermatitis and eczema    Looks like he has a contact dermatitis rash likely due to belt buckle (nickle?) on the lower abdomen with scaled hyperpigmented spots. This has been chronic for him. Mainly itchy.  Will start topical steroid to help with skin recovery. Advised to avoid belt - would suggest elastic waistband pants for a period of time vs new belt w/o metal buckle to avoid constant irritation.       Shingles    Juan Johnston is here today with new clustered vesicular rash with erythematous base along the T4/5 dermatome. No signs of disseminated infection. No CNS involvement. First outbreak of shingles right around 48 hours. A large majority of blisters are intact. Will treat with 7d course valtrex 1 gm TID.  Pain is fortunately controlled and nothing Rx needed today.  Discussed to keep rash covered as it heals/crusts over.  Recommend he take the shingrix 20mafter completing his valtrex treatment for secondary prevention going forward. Rx given for this today. Should get 2 doses 8 weeks apart.       Relevant Medications   valACYclovir (VALTREX) 1000 MG tablet   Zoster Vaccine Adjuvanted ([facility] injection (Start on 04/20/2022)   Other Visit Diagnoses     History of shingles    -  Primary       Juan Madeira MSN, NP-C ROtsegofor IViera EastPager: [phone]: [phone] 03/23/22  2:55 PM

## 2022-03-23 NOTE — Assessment & Plan Note (Addendum)
Juan Johnston is here today with new clustered vesicular rash with erythematous base along the T4/5 dermatome. No signs of disseminated infection. No CNS involvement. First outbreak of shingles right around 48 hours. A large majority of blisters are intact. Will treat with 7d course valtrex 1 gm TID.  Pain is fortunately controlled and nothing Rx needed today.  Discussed to keep rash covered as it heals/crusts over.  Recommend he take the shingrix 29mafter completing his valtrex treatment for secondary prevention going forward. Rx given for this today. Should get 2 doses 8 weeks apart.

## 2022-03-23 NOTE — Patient Instructions (Signed)
For the shingles rash I am going to send you in a prescription for Valtrex. Take 1,000 mg three times a day for 7 days.   Will write you a prescription for the Shingles vaccine - wait 1 month before you take this. It will hopefully help to prevent recurrent shingles infections for you. Works very well!   The rash on your stomach looks like contact dermatitis (itchy rash due to some skin irritation). I will send in a prescription for a steroid cream for you to use to help with the itching. Best to avoid a belt to prevent the irritation and let the skin heal up.

## 2022-03-23 NOTE — Telephone Encounter (Signed)
Pt came in with hopes to be treated for shingles and to receive the vaccine today. I spoke with triage and was told we do not carry that vaccine and he could receive treatment from his PCP. I informed pt and he insisted Marya Amsler told him we do! He would like to speak with Marya Amsler to discuss his options. I informed pt the next available appt with Marya Amsler was January 4 but he wanted to talk to him before then. He can be reached at [phone].

## 2022-03-23 NOTE — Assessment & Plan Note (Signed)
Looks like he has a contact dermatitis rash likely due to belt buckle (nickle?) on the lower abdomen with scaled hyperpigmented spots. This has been chronic for him. Mainly itchy.  Will start topical steroid to help with skin recovery. Advised to avoid belt - would suggest elastic waistband pants for a period of time vs new belt w/o metal buckle to avoid constant irritation.

## 2022-06-13 ENCOUNTER — Other Ambulatory Visit (INDEPENDENT_AMBULATORY_CARE_PROVIDER_SITE_OTHER): Payer: Self-pay | Admitting: Primary Care

## 2022-06-13 DIAGNOSIS — E782 Mixed hyperlipidemia: Secondary | ICD-10-CM

## 2022-06-16 ENCOUNTER — Other Ambulatory Visit (INDEPENDENT_AMBULATORY_CARE_PROVIDER_SITE_OTHER): Payer: Self-pay

## 2022-06-16 DIAGNOSIS — E119 Type 2 diabetes mellitus without complications: Secondary | ICD-10-CM

## 2022-06-16 DIAGNOSIS — I10 Essential (primary) hypertension: Secondary | ICD-10-CM

## 2022-06-16 MED ORDER — LISINOPRIL 40 MG PO TABS: ORAL_TABLET | ORAL | 0 refills | Status: DC

## 2022-06-16 MED ORDER — ATORVASTATIN CALCIUM 40 MG PO TABS: 40.0000 mg | ORAL_TABLET | Freq: Every day | ORAL | 0 refills | Status: DC

## 2022-07-12 ENCOUNTER — Other Ambulatory Visit: Payer: Self-pay | Admitting: Family

## 2022-07-12 DIAGNOSIS — B20 Human immunodeficiency virus [HIV] disease: Secondary | ICD-10-CM

## 2022-08-09 ENCOUNTER — Ambulatory Visit (INDEPENDENT_AMBULATORY_CARE_PROVIDER_SITE_OTHER): Payer: Medicare Other | Admitting: Primary Care

## 2022-08-10 ENCOUNTER — Encounter (INDEPENDENT_AMBULATORY_CARE_PROVIDER_SITE_OTHER): Payer: Self-pay | Admitting: Primary Care

## 2022-08-10 ENCOUNTER — Ambulatory Visit (INDEPENDENT_AMBULATORY_CARE_PROVIDER_SITE_OTHER): Payer: 59 | Admitting: Primary Care

## 2022-08-10 VITALS — BP 137/67 | HR 78 | Resp 16 | Ht 67.0 in | Wt 182.8 lb

## 2022-08-10 DIAGNOSIS — E119 Type 2 diabetes mellitus without complications: Secondary | ICD-10-CM

## 2022-08-10 DIAGNOSIS — I10 Essential (primary) hypertension: Secondary | ICD-10-CM

## 2022-08-10 LAB — POCT GLYCOSYLATED HEMOGLOBIN (HGB A1C): HbA1c, POC (controlled diabetic range): 6.5 % (ref 0.0–7.0)

## 2022-08-10 MED ORDER — LISINOPRIL 40 MG PO TABS: ORAL_TABLET | ORAL | 1 refills | Status: DC

## 2022-08-10 NOTE — Patient Instructions (Signed)
Jaw Range of Motion Exercises Jaw range of motion exercises are exercises that help your jaw move better. Exercises that help you have good posture (postural exercises) also help relieve jaw discomfort. These are often done along with range of motion exercises. These exercises can help prevent or improve: Trouble opening your mouth. Pain in your jaw while it is open or closed. Temporomandibular joint (TMJ) pain. Headache caused by jaw tension. Take other actions to prevent or relieve jaw pain, such as: Avoiding things that cause or increase jaw pain. This may include: Chewing gum or eating hard foods. Clenching your jaw or teeth, grinding your teeth, or keeping tension in your jaw muscles. Opening your mouth wide, such as for a big yawn. Leaning on your jaw, such as resting your jaw in your hand while leaning on a desk. Putting ice on your jaw. To do this: Put ice in a plastic bag. Place a towel between your skin and the bag. Leave the ice on for 20 minutes, 2-3 times a day. Remove the ice if your skin turns bright red. This is very important. If you cannot feel pain, heat, or cold, you have a greater risk of damage to the area. Only do jaw exercises that your health care provider recommends. Only move your jaw as far as it can comfortably go in each direction. Do not move your jaw into positions that cause pain. Range of motion exercises Repeat each of these exercises 8 times, 1-2 times a day, or as told by your health care provider. Exercise A: Forward protrusion  Push your jaw forward. Hold this position for 1-2 seconds. Let your jaw return to its normal position and rest it there for 1-2 seconds. Exercise B: Controlled opening  Stand or sit in front of a mirror. Place your tongue on the roof of your mouth, just behind your top teeth. Keeping your tongue on the roof of your mouth, slowly open and close your mouth. While you open and close your mouth, watch your jaw in the mirror. Try  to keep your jaw from moving to one side or the other. Exercise C: Right and left motion  Move your jaw right. Hold this position for 1-2 seconds. Let your jaw return to its normal position, and rest it there for 1-2 seconds. Move your jaw left. Hold this position for 1-2 seconds. Let your jaw return to its normal position, and rest it there for 1-2 seconds. Postural exercises Exercise A: Chin tucks  You can do this exercise sitting, standing, or lying down. Move your head straight back, keeping your head level. You can guide the movement by placing your fingers on your chin to push your jaw back in an even motion. You should be able to feel a double chin form at the end of the motion. Hold this position for 5 seconds. Repeat 10-15 times. Exercise B: Shoulder blade squeeze  Sit or stand. Bend your elbows to about 90 degrees, which is the shape of a capital letter "L." Keep your upper arms by your body. Squeeze your shoulder blades down and back, as though you were trying to touch your elbows behind you. Do not shrug your shoulders or move your head. Hold this position for 5 seconds. Repeat 10-15 times. Exercise C: Chest stretch  Stand in a doorway with one of your feet slightly in front of the other. This is called a staggered stance. If you cannot reach your forearms to the door frame, do this exercise in   a corner of a room. Put both of your hands and your forearms on the door frame, with your arms wide apart. Make sure your arms are at a 90-degree angle to your body. Place your hands on the door frame at the height of your elbows. Slowly move your weight onto your front foot until you feel a stretch across your chest and in the front of your shoulders. Keep your head and chest upright and keep your abdominal muscles tight. Do not lean in. Hold this position for 30 seconds. Repeat 3 times. Contact a health care provider if: You have jaw pain that is new or gets worse. You have clicking or  popping sounds while doing the exercises. Get help right away if: Your jaw is stuck in one place and you cannot move it. You cannot open or close your mouth. Summary Jaw range of motion exercises are exercises that help your jaw move better. Take actions to prevent or relieve jaw pain: limit chewing gum or eating hard foods; clenching your jaw or teeth; or leaning on your jaw, such as resting your jaw in your hand while leaning on a desk. Repeat each of the jaw range of motion exercises 8 times, 1-2 times a day, or as told by your health care provider. Contact a health care provider if you have clicking or popping sounds while doing the exercises. This information is not intended to replace advice given to you by your health care provider. Make sure you discuss any questions you have with your health care provider. Document Revised: 11/02/2020 Document Reviewed: 11/02/2020 Elsevier Patient Education  2023 [employer] Inc.  

## 2022-08-11 ENCOUNTER — Other Ambulatory Visit: Payer: Self-pay | Admitting: Family

## 2022-08-11 DIAGNOSIS — B20 Human immunodeficiency virus [HIV] disease: Secondary | ICD-10-CM

## 2022-08-11 NOTE — Progress Notes (Signed)
[facility]  Juan Johnston, is a 72 y.o. male  [id number]  [id number]  DOB - July 25, 1950  Chief Complaint  Patient presents with   Hypertension   Diabetes       Subjective:   Juan Johnston is a 72 y.o. male here today for a follow up visit for the management of hypertension and type 2 diabetes.  Patient has No headache, No chest pain, No abdominal pain - No Nausea, No new weakness tingling or numbness, No Cough - shortness of breath.  He also denies increased urination, thirst, neuropathy, hunger or vision changes.  No complaints or concerns at this time feels pretty good.  No problems updated.  No Known Allergies  Past Medical History:  Diagnosis Date   HIV infection (HCC)    Thyroid goiter     Current Outpatient Medications on File Prior to Visit  Medication Sig Dispense Refill   aspirin 81 MG chewable tablet Chew 1 tablet (81 mg total) by mouth daily. 30 tablet 0   atorvastatin (LIPITOR) 40 MG tablet Take 1 tablet (40 mg total) by mouth daily. 90 tablet 0   metFORMIN (GLUCOPHAGE XR) 500 MG 24 hr tablet Take 1 tablet (500 mg total) by mouth daily with breakfast. 90 tablet 1   triamcinolone ointment (KENALOG) 0.5 % Apply 1 Application topically 2 (two) times daily. 30 g 0   Zoster Vaccine Adjuvanted Juan Johnston) injection Inject 0.5 mLs into the muscle every 8 (eight) weeks. 0.5 mL 1   No current facility-administered medications on file prior to visit.    Objective:   Vitals:   08/10/22 1411  BP: 137/67  Pulse: 78  Resp: 16  SpO2: 98%  Weight: 182 lb 12.8 oz (82.9 kg)  Height: 5\' 7"  (1.702 m)    Comprehensive ROS Pertinent positive and negative noted in HPI   Exam General appearance : Awake, alert, not in any distress. Speech Clear. Not toxic looking HEENT: Atraumatic and Normocephalic, pupils equally reactive to light and accomodation Neck: Supple, no JVD. No cervical lymphadenopathy.  Chest: Good air entry bilaterally, no  added sounds  CVS: S1 S2 regular, no murmurs.  Abdomen: Bowel sounds present, Non tender and not distended with no gaurding, rigidity or rebound. Extremities: B/L Lower Ext shows no edema, both legs are warm to touch Neurology: Awake alert, and oriented X 3, CN II-XII intact, Non focal Skin: No Rash  Data Review Lab Results  Component Value Date   HGBA1C 6.5 08/10/2022   HGBA1C 6.3 (A) 08/05/2021   HGBA1C 6.8 (A) 04/10/2021    Juan Johnston was seen today for hypertension and diabetes.  Diagnoses and all orders for this visit:  Type 2 diabetes mellitus without complication, without long-term current use of insulin (HCC) -     POCT glycosylated hemoglobin (Hb A1C) 6.5 .  Slightly up by 0.2 insignificant at this time however friendly reminder - educated on lifestyle modifications, including but not limited to diet choices and adding exercise to daily routine.   -     Microalbumin / creatinine urine ratio  Essential hypertension Blood pressure is well-controlled less than 140/90 and near 130/80. Continues to monitor sodium intake, discussed processed foods such as deli meats that are package or processed but deli meats from the daily spoke with her and have less preservatives. Explained that having normal blood pressure is the goal and medications are helping to get to goal and maintain normal blood pressure. DIET: Limit salt intake, read nutrition labels to check salt content,  limit fried and high fatty foods  Avoid using multisymptom OTC cold preparations that generally contain sudafed which can rise BP. Consult with pharmacist on best cold relief products to use for persons with HTN EXERCISE Discussed incorporating exercise such as walking - 30 minutes most days of the week and can do in 10 minute intervals    -     lisinopril (ZESTRIL) 40 MG tablet; TAKE 1 TABLET(40 MG) BY MOUTH DAILY Patient have been counseled extensively about nutrition and exercise. Other issues discussed during this  visit include: low cholesterol diet, weight control and daily exercise, foot care, annual eye examinations at Ophthalmology, importance of adherence with medications and regular follow-up. We also discussed long term complications of uncontrolled diabetes and hypertension.   Return in 3 months (on 11/10/2022).  The patient was given clear instructions to go to ER or return to medical Johnston if symptoms don't improve, worsen or new problems develop. The patient verbalized understanding. The patient was told to call to get lab results if they haven't heard anything in the next week.   This note has been created with Education officer, environmental. Any transcriptional errors are unintentional.   Grayce Sessions, NP 08/11/2022, 6:45 PM

## 2022-08-25 ENCOUNTER — Encounter: Payer: Self-pay | Admitting: Family

## 2022-08-25 ENCOUNTER — Ambulatory Visit (INDEPENDENT_AMBULATORY_CARE_PROVIDER_SITE_OTHER): Payer: 59 | Admitting: Family

## 2022-08-25 ENCOUNTER — Other Ambulatory Visit: Payer: Self-pay

## 2022-08-25 VITALS — BP 115/69 | HR 82 | Temp 97.7°F | Ht 67.0 in | Wt 182.0 lb

## 2022-08-25 DIAGNOSIS — Z0000 Encounter for general adult medical examination without abnormal findings: Secondary | ICD-10-CM

## 2022-08-25 DIAGNOSIS — B20 Human immunodeficiency virus [HIV] disease: Secondary | ICD-10-CM | POA: Diagnosis not present

## 2022-08-25 DIAGNOSIS — Z23 Encounter for immunization: Secondary | ICD-10-CM

## 2022-08-25 DIAGNOSIS — Z7185 Encounter for immunization safety counseling: Secondary | ICD-10-CM | POA: Diagnosis not present

## 2022-08-25 MED ORDER — BIKTARVY 50-200-25 MG PO TABS: 1.0000 | ORAL_TABLET | Freq: Every day | ORAL | 6 refills | Status: DC

## 2022-08-25 NOTE — Assessment & Plan Note (Signed)
Discussed importance of safe sexual practice and condom use. Condoms and STD testing offered.  Hepatitis A and Hepatitis B vaccination updated. Pneumococcal vaccination should be adequate having received Pneumovax after 65. Would not harm for Prevnar 20 but not sure it would be needed.

## 2022-08-25 NOTE — Assessment & Plan Note (Signed)
Keats continues to have well-controlled virus with good adherence and tolerance to [employer].  Reviewed previous lab work and discussed plan of care.  Continue current dose of Biktarvy.  Check blood work.  Plan for follow-up in 6 months or sooner if needed with lab work on the same day.

## 2022-08-25 NOTE — Patient Instructions (Signed)
Nice to see you. ? ?We will check your lab work today. ? ?Continue to take your medication daily as prescribed. ? ?Refills have been sent to the pharmacy. ? ?Plan for follow up in 6 months or sooner if needed with lab work on the same day. ? ?Have a great day and stay safe! ? ?

## 2022-08-25 NOTE — Progress Notes (Signed)
Brief Narrative   Patient ID: Juan Johnston, male    DOB: February 08, 1951, 72 y.o.   MRN: [id number]  Juan Johnston is a 72 year old African-American male diagnosed with HIV disease in December 2018 with risk factor of MSM.  Initial CD4 count of 70 8 and viral load of 1.2 million.  Initial genotype performed on 04/21/2017 with K103 (efavirenz and nevirapine) and E138G (rilpivirine).  [id number] negative.  Initial regimen of Symtuza.  Entered care at [facility] stage III.  Now on Biktarvy.   Subjective:    Chief Complaint  Patient presents with   Follow-up   HIV Positive/AIDS    HPI:  Juan Johnston is a 72 y.o. male with HIV disease last seen on 02/01/2022 with well-controlled virus and good adherence and tolerance to [employer].  Viral load was undetectable with CD4 count of 454.  Kidney function, liver function, electrolytes within normal ranges.  LDL elevated at 124, triglycerides 182, and HDL 41.  Currently on atorvastatin.  Here today for follow-up.  Juan Johnston has been doing well since his last office visit and continues to take [facility] with no adverse side effects or problems obtaining from the pharmacy.  Currently growing his garden including cucumbers, tomatoes, and watermelon.  No new concerns/complaints.  Has a couple questions about vaccinations.  Condoms and STD testing offered.  Denies fevers, chills, night sweats, headaches, changes in vision, neck pain/stiffness, nausea, diarrhea, vomiting, lesions or rashes.  No Known Allergies    Outpatient Medications Prior to Visit  Medication Sig Dispense Refill   aspirin 81 MG chewable tablet Chew 1 tablet (81 mg total) by mouth daily. 30 tablet 0   lisinopril (ZESTRIL) 40 MG tablet TAKE 1 TABLET(40 MG) BY MOUTH DAILY 90 tablet 1   metFORMIN (GLUCOPHAGE XR) 500 MG 24 hr tablet Take 1 tablet (500 mg total) by mouth daily with breakfast. 90 tablet 1   triamcinolone ointment (KENALOG) 0.5 % Apply 1 Application topically 2 (two) times daily. 30 g  0   BIKTARVY 50-200-25 MG TABS tablet TAKE 1 TABLET BY MOUTH DAILY 30 tablet 0   atorvastatin (LIPITOR) 40 MG tablet Take 1 tablet (40 mg total) by mouth daily. 90 tablet 0   Zoster Vaccine Adjuvanted [facility]) injection Inject 0.5 mLs into the muscle every 8 (eight) weeks. (Patient not taking: Reported on 08/25/2022) 0.5 mL 1   No facility-administered medications prior to visit.     Past Medical History:  Diagnosis Date   HIV infection (HCC)    Thyroid goiter      Past Surgical History:  Procedure Laterality Date   CORONARY ULTRASOUND/IVUS N/A 07/21/2017   Procedure: Intravascular Ultrasound/IVUS;  Surgeon: Maeola Harman, MD;  Location: [facility] INVASIVE CV LAB;  Service: Cardiovascular;  Laterality: N/A;   PERIPHERAL VASCULAR THROMBECTOMY N/A 07/20/2017   Procedure: PERIPHERAL VASCULAR THROMBECTOMY;  Surgeon: Nada Libman, MD;  Location: [facility] INVASIVE CV LAB;  Service: Cardiovascular;  Laterality: N/A;   PERIPHERAL VASCULAR THROMBECTOMY N/A 07/21/2017   Procedure: PERIPHERAL VASCULAR THROMBECTOMY - LYSIS RECHECK;  Surgeon: Maeola Harman, MD;  Location: [facility] INVASIVE CV LAB;  Service: Cardiovascular;  Laterality: N/A;   scrotum cyst        Review of Systems  Constitutional:  Negative for appetite change, chills, fatigue, fever and unexpected weight change.  Eyes:  Negative for visual disturbance.  Respiratory:  Negative for cough, chest tightness, shortness of breath and wheezing.   Cardiovascular:  Negative for chest pain and leg swelling.  Gastrointestinal:  Negative  for abdominal pain, constipation, diarrhea, nausea and vomiting.  Genitourinary:  Negative for dysuria, flank pain, frequency, genital sores, hematuria and urgency.  Skin:  Negative for rash.  Allergic/Immunologic: Negative for immunocompromised state.  Neurological:  Negative for dizziness and headaches.      Objective:    BP 115/69   Pulse 82   Temp 97.7 F (36.5 C) (Oral)   Ht 5\' 7"   (1.702 m)   Wt 182 lb (82.6 kg)   SpO2 96%   BMI 28.51 kg/m  Nursing note and vital signs reviewed.  Physical Exam Constitutional:      General: He is not in acute distress.    Appearance: He is well-developed.  Eyes:     Conjunctiva/sclera: Conjunctivae normal.  Cardiovascular:     Rate and Rhythm: Normal rate and regular rhythm.     Heart sounds: Normal heart sounds. No murmur heard.    No friction rub. No gallop.  Pulmonary:     Effort: Pulmonary effort is normal. No respiratory distress.     Breath sounds: Normal breath sounds. No wheezing or rales.  Chest:     Chest wall: No tenderness.  Abdominal:     General: Bowel sounds are normal.     Palpations: Abdomen is soft.     Tenderness: There is no abdominal tenderness.  Musculoskeletal:     Cervical back: Neck supple.  Lymphadenopathy:     Cervical: No cervical adenopathy.  Skin:    General: Skin is warm and dry.     Findings: No rash.  Neurological:     Mental Status: He is alert and oriented to person, place, and time.  Psychiatric:        Behavior: Behavior normal.        Thought Content: Thought content normal.        Judgment: Judgment normal.         08/25/2022    2:39 PM 08/10/2022    2:11 PM 03/23/2022    1:45 PM 02/08/2022    2:22 PM 02/01/2022    2:38 PM  Depression screen PHQ 2/9  Decreased Interest 0 0 0 0 0  Down, Depressed, Hopeless 0 0 0 0 0  PHQ - 2 Score 0 0 0 0 0       Assessment & Plan:    Patient Active Problem List   Diagnosis Date Noted   Shingles 03/23/2022   Contact dermatitis and eczema 03/23/2022   Essential hypertension 11/10/2020   Erectile dysfunction due to diseases classified elsewhere 03/26/2019   Rash and nonspecific skin eruption 12/12/2018   Hyperlipidemia associated with type 2 diabetes mellitus (HCC) 05/11/2018   Leg DVT (deep venous thromboembolism), chronic, right (HCC) 07/20/2017   Asymptomatic HIV infection (HCC) 07/19/2017   GERD (gastroesophageal reflux  disease) 07/19/2017   Cough 07/15/2017   Thyroid goiter 05/19/2017   HIV disease (HCC) 04/21/2017   Healthcare maintenance 04/21/2017   Hyperlipidemia 04/21/2017     Problem List Items Addressed This Visit       Other   HIV disease (HCC) - Primary    Juan Johnston continues to have well-controlled virus with good adherence and tolerance to [employer].  Reviewed previous lab work and discussed plan of care.  Continue current dose of Biktarvy.  Check blood work.  Plan for follow-up in 6 months or sooner if needed with lab work on the same day.      Relevant Medications   bictegravir-emtricitabine-tenofovir AF (BIKTARVY) 50-200-25 MG TABS tablet  Other Relevant Orders   COMPLETE METABOLIC PANEL WITH GFR   HIV-1 RNA quant-no reflex-bld   T-helper cells (CD4) count (not at [facility])   Hepatitis A vaccine adult IM (Completed)   Heplisav-B (HepB-CPG) Vaccine (Completed)   Healthcare maintenance    Discussed importance of safe sexual practice and condom use. Condoms and STD testing offered.  Hepatitis A and Hepatitis B vaccination updated. Pneumococcal vaccination should be adequate having received Pneumovax after 65. Would not harm for Prevnar 20 but not sure it would be needed.      Other Visit Diagnoses     Immunization counseling       Relevant Orders   Hepatitis A vaccine adult IM (Completed)   Heplisav-B (HepB-CPG) Vaccine (Completed)        I have changed Juan Johnston. I am also having him maintain his aspirin, metFORMIN, triamcinolone ointment, Zoster Vaccine Adjuvanted, atorvastatin, and lisinopril.   Meds ordered this encounter  Medications   bictegravir-emtricitabine-tenofovir AF (BIKTARVY) 50-200-25 MG TABS tablet    Sig: Take 1 tablet by mouth daily.    Dispense:  30 tablet    Refill:  6    Order Specific Question:   Supervising Provider    Answer:   Judyann Munson [[id number]]     Follow-up: Return in about 6 months (around 02/25/2023), or if symptoms  worsen or fail to improve.   Marcos Eke, MSN, FNP-C Nurse Practitioner [facility] for Infectious Disease [facility] Main number: [phone]

## 2022-08-26 LAB — COMPLETE METABOLIC PANEL WITH GFR
Albumin: 4.3 g/dL (ref 3.6–5.1)
Alkaline phosphatase (APISO): 81 U/L (ref 35–144)
BUN: 16 mg/dL (ref 7–25)
CO2: 24 mmol/L (ref 20–32)
Chloride: 105 mmol/L (ref 98–110)
Globulin: 2.4 g/dL (calc) (ref 1.9–3.7)
eGFR: 66 mL/min/{1.73_m2} (ref >=60)

## 2022-08-26 LAB — T-HELPER CELLS (CD4) COUNT (NOT AT ARMC)
Absolute CD4: 577 cells/uL (ref 490–1740)
CD4 T Helper %: 29 % — ABNORMAL LOW (ref 30–61)

## 2022-08-27 LAB — COMPLETE METABOLIC PANEL WITH GFR
AG Ratio: 1.8 (calc) (ref 1.0–2.5)
ALT: 21 U/L (ref 9–46)
AST: 18 U/L (ref 10–35)
Calcium: 9.7 mg/dL (ref 8.6–10.3)
Creat: 1.18 mg/dL (ref 0.70–1.28)
Glucose, Bld: 118 mg/dL — ABNORMAL HIGH (ref 65–99)
Potassium: 4.4 mmol/L (ref 3.5–5.3)
Sodium: 138 mmol/L (ref 135–146)
Total Bilirubin: 0.5 mg/dL (ref 0.2–1.2)
Total Protein: 6.7 g/dL (ref 6.1–8.1)

## 2022-08-27 LAB — HIV-1 RNA QUANT-NO REFLEX-BLD
HIV 1 RNA Quant: 25 Copies/mL — ABNORMAL HIGH
HIV-1 RNA Quant, Log: 1.4 Log cps/mL — ABNORMAL HIGH

## 2022-08-27 LAB — T-HELPER CELLS (CD4) COUNT (NOT AT ARMC): Total lymphocyte count: 2016 cells/uL (ref 850–3900)

## 2022-09-06 ENCOUNTER — Encounter (INDEPENDENT_AMBULATORY_CARE_PROVIDER_SITE_OTHER): Payer: Self-pay

## 2022-09-06 ENCOUNTER — Ambulatory Visit (INDEPENDENT_AMBULATORY_CARE_PROVIDER_SITE_OTHER): Payer: 59

## 2022-09-06 VITALS — BP 115/69 | Ht 67.0 in | Wt 182.0 lb

## 2022-09-06 DIAGNOSIS — Z0000 Encounter for general adult medical examination without abnormal findings: Secondary | ICD-10-CM | POA: Diagnosis not present

## 2022-09-06 NOTE — Patient Instructions (Signed)
Juan Johnston , Thank you for taking time to come for your Medicare Wellness Visit. I appreciate your ongoing commitment to your health goals. Please review the following plan we discussed and let me know if I can assist you in the future.   These are the goals we discussed:  Goals      Patient Stated     Patient states he wants to work on having better health.         This is a list of the screening recommended for you and due dates:  Health Maintenance  Topic Date Due   Eye exam for diabetics  Never done   Yearly kidney health urinalysis for diabetes  Never done   COVID-19 Vaccine (5 - 2023-24 season) 12/04/2021   Zoster (Shingles) Vaccine (2 of 2) 10/07/2022   Flu Shot  11/04/2022   Complete foot exam   02/09/2023   Hemoglobin A1C  02/10/2023   Yearly kidney function blood test for diabetes  08/25/2023   Medicare Annual Wellness Visit  09/06/2023   Cologuard (Stool DNA test)  12/27/2023   DTaP/Tdap/Td vaccine (2 - Td or Tdap) 11/29/2028   Pneumonia Vaccine  Completed   Hepatitis C Screening  Completed   HPV Vaccine  Aged Out    Advanced directives: Advance directive discussed with you today. Even though you declined this today, please call our office should you change your mind, and we can give you the proper paperwork for you to fill out. Advance care planning is a way to make decisions about medical care that fits your values in case you are ever unable to make these decisions for yourself.  Information on Advanced Care Planning can be found at [facility] Advance Health Care Directives Advance Health Care Directives ([email or web address]    Conditions/risks identified: You're due for a Oceanographer. If you want to get this, you can have it done at the pharmacy. Please ask them to send our office documentation of the vaccine so we can update your chart  Next appointment: Follow up in one year for your annual wellness visit. September 12, 2023 at 2:30pm TELEPHONE  VISIT  Preventive Care 72 Years and Older, Male  Preventive care refers to lifestyle choices and visits with your health care provider that can promote health and wellness. What does preventive care include? A yearly physical exam. This is also called an annual well check. Dental exams once or twice a year. Routine eye exams. Ask your health care provider how often you should have your eyes checked. Personal lifestyle choices, including: Daily care of your teeth and gums. Regular physical activity. Eating a healthy diet. Avoiding tobacco and drug use. Limiting alcohol use. Practicing safe sex. Taking low doses of aspirin every day. Taking vitamin and mineral supplements as recommended by your health care provider. What happens during an annual well check? The services and screenings done by your health care provider during your annual well check will depend on your age, overall health, lifestyle risk factors, and family history of disease. Counseling  Your health care provider may ask you questions about your: Alcohol use. Tobacco use. Drug use. Emotional well-being. Home and relationship well-being. Sexual activity. Eating habits. History of falls. Memory and ability to understand (cognition). Work and work Astronomer. Screening  You may have the following tests or measurements: Height, weight, and BMI. Blood pressure. Lipid and cholesterol levels. These may be checked every 5 years, or more frequently if you are over 50  years old. Skin check. Lung cancer screening. You may have this screening every year starting at age 59 if you have a 30-pack-year history of smoking and currently smoke or have quit within the past 15 years. Fecal occult blood test (FOBT) of the stool. You may have this test every year starting at age 109. Flexible sigmoidoscopy or colonoscopy. You may have a sigmoidoscopy every 5 years or a colonoscopy every 10 years starting at age 69. Prostate cancer  screening. Recommendations will vary depending on your family history and other risks. Hepatitis C blood test. Hepatitis B blood test. Sexually transmitted disease (STD) testing. Diabetes screening. This is done by checking your blood sugar (glucose) after you have not eaten for a while (fasting). You may have this done every 1-3 years. Abdominal aortic aneurysm (AAA) screening. You may need this if you are a current or former smoker. Osteoporosis. You may be screened starting at age 57 if you are at high risk. Talk with your health care provider about your test results, treatment options, and if necessary, the need for more tests. Vaccines  Your health care provider may recommend certain vaccines, such as: Influenza vaccine. This is recommended every year. Tetanus, diphtheria, and acellular pertussis (Tdap, Td) vaccine. You may need a Td booster every 10 years. Zoster vaccine. You may need this after age 36. Pneumococcal 13-valent conjugate (PCV13) vaccine. One dose is recommended after age 54. Pneumococcal polysaccharide (PPSV23) vaccine. One dose is recommended after age 16. Talk to your health care provider about which screenings and vaccines you need and how often you need them. This information is not intended to replace advice given to you by your health care provider. Make sure you discuss any questions you have with your health care provider. Document Released: 04/18/2015 Document Revised: 12/10/2015 Document Reviewed: 01/21/2015 Elsevier Interactive Patient Education  2017 [employer].  Fall Prevention in the Home Falls can cause injuries. They can happen to people of all ages. There are many things you can do to make your home safe and to help prevent falls. What can I do on the outside of my home? Regularly fix the edges of walkways and driveways and fix any cracks. Remove anything that might make you trip as you walk through a door, such as a raised step or threshold. Trim any  bushes or trees on the path to your home. Use bright outdoor lighting. Clear any walking paths of anything that might make someone trip, such as rocks or tools. Regularly check to see if handrails are loose or broken. Make sure that both sides of any steps have handrails. Any raised decks and porches should have guardrails on the edges. Have any leaves, snow, or ice cleared regularly. Use sand or salt on walking paths during winter. Clean up any spills in your garage right away. This includes oil or grease spills. What can I do in the bathroom? Use night lights. Install grab bars by the toilet and in the tub and shower. Do not use towel bars as grab bars. Use non-skid mats or decals in the tub or shower. If you need to sit down in the shower, use a plastic, non-slip stool. Keep the floor dry. Clean up any water that spills on the floor as soon as it happens. Remove soap buildup in the tub or shower regularly. Attach bath mats securely with double-sided non-slip rug tape. Do not have throw rugs and other things on the floor that can make you trip. What can I  do in the bedroom? Use night lights. Make sure that you have a light by your bed that is easy to reach. Do not use any sheets or blankets that are too big for your bed. They should not hang down onto the floor. Have a firm chair that has side arms. You can use this for support while you get dressed. Do not have throw rugs and other things on the floor that can make you trip. What can I do in the kitchen? Clean up any spills right away. Avoid walking on wet floors. Keep items that you use a lot in easy-to-reach places. If you need to reach something above you, use a strong step stool that has a grab bar. Keep electrical cords out of the way. Do not use floor polish or wax that makes floors slippery. If you must use wax, use non-skid floor wax. Do not have throw rugs and other things on the floor that can make you trip. What can I do  with my stairs? Do not leave any items on the stairs. Make sure that there are handrails on both sides of the stairs and use them. Fix handrails that are broken or loose. Make sure that handrails are as long as the stairways. Check any carpeting to make sure that it is firmly attached to the stairs. Fix any carpet that is loose or worn. Avoid having throw rugs at the top or bottom of the stairs. If you do have throw rugs, attach them to the floor with carpet tape. Make sure that you have a light switch at the top of the stairs and the bottom of the stairs. If you do not have them, ask someone to add them for you. What else can I do to help prevent falls? Wear shoes that: Do not have high heels. Have rubber bottoms. Are comfortable and fit you well. Are closed at the toe. Do not wear sandals. If you use a stepladder: Make sure that it is fully opened. Do not climb a closed stepladder. Make sure that both sides of the stepladder are locked into place. Ask someone to hold it for you, if possible. Clearly mark and make sure that you can see: Any grab bars or handrails. First and last steps. Where the edge of each step is. Use tools that help you move around (mobility aids) if they are needed. These include: Canes. Walkers. Scooters. Crutches. Turn on the lights when you go into a dark area. Replace any light bulbs as soon as they burn out. Set up your furniture so you have a clear path. Avoid moving your furniture around. If any of your floors are uneven, fix them. If there are any pets around you, be aware of where they are. Review your medicines with your doctor. Some medicines can make you feel dizzy. This can increase your chance of falling. Ask your doctor what other things that you can do to help prevent falls. This information is not intended to replace advice given to you by your health care provider. Make sure you discuss any questions you have with your health care  provider. Document Released: 01/16/2009 Document Revised: 08/28/2015 Document Reviewed: 04/26/2014 Elsevier Interactive Patient Education  2017 [employer].

## 2022-09-06 NOTE — Progress Notes (Signed)
 I connected with  Wendi Maya on 09/06/22 by a audio enabled telemedicine application and verified that I am speaking with the correct person using two identifiers.  Patient Location: Home  Provider Location: Home Office  I discussed the limitations of evaluation and management by telemedicine. The patient expressed understanding and agreed to proceed.  Subjective:   Juan Johnston is a 72 y.o. male who presents for Medicare Annual/Subsequent preventive examination.  Review of Systems     Cardiac Risk Factors include: advanced age (>57men, >47 women);diabetes mellitus;dyslipidemia;hypertension;male gender     Objective:    Today's Vitals   09/06/22 1540  BP: 115/69  Weight: 182 lb (82.6 kg)  Height: 5\' 7"  (1.702 m)   Body mass index is 28.51 kg/m.     09/06/2022    3:50 PM 11/29/2020   11:22 AM 07/07/2018    9:04 AM 10/10/2017    9:29 AM 08/15/2017   10:36 AM 07/20/2017    3:27 AM 02/26/2017    2:57 PM  Advanced Directives  Does Patient Have a Medical Advance Directive? No No No No No No No  Would patient like information on creating a medical advance directive? No - Patient declined No - Patient declined No - Patient declined   No - Patient declined No - Patient declined    Current Medications (verified) Outpatient Encounter Medications as of 09/06/2022  Medication Sig   aspirin 81 MG chewable tablet Chew 1 tablet (81 mg total) by mouth daily.   bictegravir-emtricitabine-tenofovir AF (BIKTARVY) 50-200-25 MG TABS tablet Take 1 tablet by mouth daily.   lisinopril (ZESTRIL) 40 MG tablet TAKE 1 TABLET(40 MG) BY MOUTH DAILY   metFORMIN (GLUCOPHAGE XR) 500 MG 24 hr tablet Take 1 tablet (500 mg total) by mouth daily with breakfast.   atorvastatin (LIPITOR) 40 MG tablet Take 1 tablet (40 mg total) by mouth daily.   triamcinolone ointment (KENALOG) 0.5 % Apply 1 Application topically 2 (two) times daily.   Zoster Vaccine Adjuvanted [facility]) injection Inject 0.5 mLs into  the muscle every 8 (eight) weeks. (Patient not taking: Reported on 08/25/2022)   No facility-administered encounter medications on file as of 09/06/2022.    Allergies (verified) Patient has no known allergies.   History: Past Medical History:  Diagnosis Date   HIV infection (HCC)    Thyroid goiter    Past Surgical History:  Procedure Laterality Date   CORONARY ULTRASOUND/IVUS N/A 07/21/2017   Procedure: Intravascular Ultrasound/IVUS;  Surgeon: Maeola Harman, MD;  [facility] INVAS[facility] CV LAB;  Service: Cardiovascular;  Laterality: N/A;   PERIPHERAL VASCULAR THROMBECTOMY N/A 07/20/2017   Procedure: PERIPHERAL VASCULAR THROMBECTOMY;  Surgeon: Nada Libman, MD;  Location: [facility] INVASIVE CV LAB;  Service: Cardiovascular;  Laterality: N/A;   PERIPHERAL VASCULAR THROMBECTOMY N/A 07/21/2017   Procedure: PERIPHERAL VASCULAR THROMBECTOMY - LYSIS RECHECK;  Surgeon: Maeola Harman, MD;  Location: [facility] INVASIVE CV LAB;  Service: Cardiovascular;  Laterality: N/A;   scrotum cyst     Family History  Problem Relation Age of Onset   Hypertension Mother    Social History   Socioeconomic History   Marital status: Single    Spouse name: Not on file   Number of children: Not on file   Years of education: Not on file   Highest education level: Not on file  Occupational History   Not on file  Tobacco Use   Smoking status: Some Days    Packs/day: .3    Types: Cigarettes  Last attempt to quit: 02/03/2017    Years since quitting: 5.5   Smokeless tobacco: Never   Tobacco comments:    "Does not inhale"; states working on cutting back  Vaping Use   Vaping Use: Never used  Substance and Sexual Activity   Alcohol use: Yes    Comment: occasional   Drug use: No   Sexual activity: Yes    Partners: Male    Birth control/protection: None    Comment: declined condoms  Other Topics Concern   Not on file  Social History Narrative   Not on file   Social Determinants of Health    Financial Resource Strain: Low Risk  (09/06/2022)   Overall Financial Resource Strain (CARDIA)    Difficulty of Paying Living Expenses: Not hard at all  Food Insecurity: No Food Insecurity (09/06/2022)   Hunger Vital Sign    Worried About Running Out of Food in the Last Year: Never true    Ran Out of Food in the Last Year: Never true  Transportation Needs: No Transportation Needs (09/06/2022)   PRAPARE - Administrator, Civil Service (Medical): No    Lack of Transportation (Non-Medical): No  Physical Activity: Sufficiently Active (09/06/2022)   Exercise Vital Sign    Days of Exercise per Week: 7 days    Minutes of Exercise per Session: 30 min  Stress: No Stress Concern Present (09/06/2022)   [employer] of Occupational Health - Occupational Stress Questionnaire    Feeling of Stress : Not at all  Social Connections: Socially Integrated (09/06/2022)   Social Connection and Isolation Panel [NHANES]    Frequency of Communication with Friends and Family: More than three times a week    Frequency of Social Gatherings with Friends and Family: More than three times a week    Attends Religious Services: More than 4 times per year    Active Member of [employer] or Organizations: Yes    Attends Engineer, structural: More than 4 times per year    Marital Status: Married    Tobacco Counseling Ready to quit: Yes Counseling given: Yes Tobacco comments: "Does not inhale"; states working on cutting back   Clinical Intake:  Pre-visit preparation completed: Yes  Pain : No/denies pain     BMI - recorded: 28.51 Nutritional Status: BMI 25 -29 Overweight Nutritional Risks: None Diabetes: Yes CBG done?: No (telehealth visit) Did pt. bring in CBG monitor from home?: No  How often do you need to have someone help you when you read instructions, pamphlets, or other written materials from your doctor or pharmacy?: 1 - Never  Diabetic?yes  Nutrition Risk Assessment:  Has the  patient had any N/V/D within the last 2 months?  No  Does the patient have any non-healing wounds?  No  Has the patient had any unintentional weight loss or weight gain?  No   Diabetes:  Is the patient diabetic?  Yes  If diabetic, was a CBG obtained today?  No  Did the patient bring in their glucometer from home?  No  How often do you monitor your CBG's? He doesn't check them at home.   Financial Strains and Diabetes Management:  Are you having any financial strains with the device, your supplies or your medication? No .  Does the patient want to be seen by Chronic Care Management for management of their diabetes?  No  Would the patient like to be referred to a Nutritionist or for Diabetic Management?  No  Diabetic Exams:  Diabetic Eye Exam: Completed patient states it has been within the last year but doesn't recall exact date Diabetic Foot Exam: Completed 02/08/2022   Interpreter Needed?: No  Information entered by ::  Tamitha Norell, CMA   Activities of Daily Living    09/06/2022    3:48 PM  In your present state of health, do you have any difficulty performing the following activities:  Hearing? 0  Vision? 0  Difficulty concentrating or making decisions? 0  Walking or climbing stairs? 0  Dressing or bathing? 0  Doing errands, shopping? 0  Preparing Food and eating ? N  Using the Toilet? N  In the past six months, have you accidently leaked urine? N  Do you have problems with loss of bowel control? N  Managing your Medications? N  Managing your Finances? N  Housekeeping or managing your Housekeeping? N    Patient Care Team: Grayce Sessions, NP as PCP - General (Internal Medicine)  Indicate any recent Medical Services you may have received from other than [facility] providers in the past year (date may be approximate).     Assessment:   This is a routine wellness examination for Rolla.  Hearing/Vision screen Hearing Screening - Comments:: Patient denies any  hearing difficulties.   Vision Screening - Comments:: Patient states they wear reading glasses only.    Dietary issues and exercise activities discussed: Current Exercise Habits: Home exercise routine, Type of exercise: walking, Time (Minutes): 40, Frequency (Times/Week): 7, Weekly Exercise (Minutes/Week): 280, Intensity: Mild, Exercise limited by: None identified   Goals Addressed             This Visit's Progress    Patient Stated       Patient states he wants to work on having better health.        Depression Screen    09/06/2022    3:45 PM 08/25/2022    2:39 PM 08/10/2022    2:11 PM 03/23/2022    1:45 PM 02/08/2022    2:22 PM 02/01/2022    2:38 PM 08/05/2021    1:39 PM  PHQ 2/9 Scores  PHQ - 2 Score 0 0 0 0 0 0 0    Fall Risk    09/06/2022    3:44 PM 08/25/2022    2:39 PM 08/10/2022    2:11 PM 03/23/2022    1:45 PM 02/08/2022    2:20 PM  Fall Risk   Falls in the past year? 0 0 0 0 0  Number falls in past yr: 0 0 0 0 0  Injury with Fall? 0 0 0 0 0  Risk for fall due to : No Fall Risks No Fall Risks No Fall Risks No Fall Risks No Fall Risks  Follow up Falls prevention discussed Falls evaluation completed  Falls evaluation completed     FALL RISK PREVENTION PERTAINING TO THE HOME:  Any stairs in or around the home? No  If so, are there any without handrails? No  Home free of loose throw rugs in walkways, pet beds, electrical cords, etc? Yes  Adequate lighting in your home to reduce risk of falls? Yes   ASSISTIVE DEVICES UTILIZED TO PREVENT FALLS:  Life alert? No  Use of a cane, walker or w/c? No  Grab bars in the bathroom? No  Shower chair or bench in shower? No  Elevated toilet seat or a handicapped toilet? No   TIMED UP AND GO:  Was the test performed? No .  Cognitive Function:        09/06/2022    3:48 PM 11/29/2020   11:25 AM  6CIT Screen  What Year? 0 points 0 points  What month? 0 points 0 points  What time? 0 points 0 points  Count back from 20  0 points 0 points  Months in reverse 0 points 0 points  Repeat phrase 0 points 10 points  Total Score 0 points 10 points    Immunizations Immunization History  Administered Date(s) Administered   Fluad Quad(high Dose 65+) 04/14/2020, 02/01/2022   Hepatitis A, Adult 08/25/2022   Hepb-cpg 08/25/2022   Influenza,inj,Quad PF,6+ Mos 05/19/2017, 04/20/2018, 11/30/2018, 12/09/2020   PFIZER Comirnaty(Gray Top)Covid-19 Tri-Sucrose Vaccine 05/15/2020   PFIZER(Purple Top)SARS-COV-2 Vaccination 06/21/2019, 07/14/2019, 11/21/2019   PPD Test 11/19/2019, 11/26/2020   Pneumococcal Conjugate-13 10/24/2017   Pneumococcal Polysaccharide-23 01/17/2018   Tdap 11/30/2018   Zoster Recombinat (Shingrix) 08/12/2022    TDAP status: Up to date  Flu Vaccine status: Up to date  Pneumococcal vaccine status: Up to date  Covid-19 vaccine status: Information provided on how to obtain vaccines.   Qualifies for Shingles Vaccine? Yes   Zostavax completed Yes   Shingrix Completed?: Yes  Screening Tests Health Maintenance  Topic Date Due   OPHTHALMOLOGY EXAM  Never done   Diabetic kidney evaluation - Urine ACR  Never done   COVID-19 Vaccine (5 - 2023-24 season) 12/04/2021   Zoster Vaccines- Shingrix (2 of 2) 10/07/2022   INFLUENZA VACCINE  11/04/2022   FOOT EXAM  02/09/2023   HEMOGLOBIN A1C  02/10/2023   Diabetic kidney evaluation - eGFR measurement  08/25/2023   Medicare Annual Wellness (AWV)  09/06/2023   Fecal DNA (Cologuard)  12/27/2023   DTaP/Tdap/Td (2 - Td or Tdap) 11/29/2028   Pneumonia Vaccine 25+ Years old  Completed   Hepatitis C Screening  Completed   HPV VACCINES  Aged Out    Health Maintenance  Health Maintenance Due  Topic Date Due   OPHTHALMOLOGY EXAM  Never done   Diabetic kidney evaluation - Urine ACR  Never done   COVID-19 Vaccine (5 - 2023-24 season) 12/04/2021    Colorectal cancer screening: Type of screening: Cologuard. Completed 12/26/20. Repeat every 3 years  Lung  Cancer Screening: (Low Dose CT Chest recommended if Age 19-80 years, 30 pack-year currently smoking OR have quit w/in 15years.) does not qualify.   Lung Cancer Screening Referral: N/A  Additional Screening:  Hepatitis C Screening: does qualify; Completed 04/06/2017  Vision Screening: Recommended annual ophthalmology exams for early detection of glaucoma and other disorders of the eye. Is the patient up to date with their annual eye exam?  Yes  Who is the provider or what is the name of the office in which the patient attends annual eye exams? [facility]  Dental Screening: Recommended annual dental exams for proper oral hygiene  Community Resource Referral / Chronic Care Management: CRR required this visit?  No   CCM required this visit?  No      Plan:     I have personally reviewed and noted the following in the patient's chart:   Medical and social history Use of alcohol, tobacco or illicit drugs  Current medications and supplements including opioid prescriptions. Patient is not currently taking opioid prescriptions. Functional ability and status Nutritional status Physical activity Advanced directives List of other physicians Hospitalizations, surgeries, and ER visits in previous 12 months Vitals Screenings to include cognitive, depression, and falls Referrals and appointments  In addition,  I have reviewed and discussed with patient certain preventive protocols, quality metrics, and best practice recommendations. A written personalized care plan for preventive services as well as general preventive health recommendations were provided to patient.   Due to this being a telephonic visit, the after visit summary with patients personalized plan was offered to patient via mail or my-chart. Patient would like to access their AVS via my-chart    Jordan Hawks Indianna Boran, CMA   09/06/2022   Nurse Notes:

## 2022-09-23 ENCOUNTER — Other Ambulatory Visit: Payer: Self-pay

## 2022-09-23 ENCOUNTER — Ambulatory Visit (INDEPENDENT_AMBULATORY_CARE_PROVIDER_SITE_OTHER): Payer: 59

## 2022-09-23 DIAGNOSIS — Z23 Encounter for immunization: Secondary | ICD-10-CM | POA: Diagnosis not present

## 2022-10-04 ENCOUNTER — Other Ambulatory Visit (INDEPENDENT_AMBULATORY_CARE_PROVIDER_SITE_OTHER): Payer: Self-pay | Admitting: Primary Care

## 2022-10-06 NOTE — Telephone Encounter (Signed)
Labs in date  Requested Prescriptions  Pending Prescriptions Disp Refills   atorvastatin (LIPITOR) 40 MG tablet [Pharmacy Med Name: ATORVASTATIN 40MG  TABLETS] 90 tablet 0    Sig: TAKE 1 TABLET(40 MG) BY MOUTH DAILY     Cardiovascular:  Antilipid - Statins Failed - 10/04/2022  6:25 PM      Failed - Lipid Panel in normal range within the last 12 months    Cholesterol, Total  Date Value Ref Range Status  05/08/2021 159 100 - 199 mg/dL Final   Cholesterol  Date Value Ref Range Status  02/01/2022 195 <200 mg/dL Final   LDL Cholesterol (Calc)  Date Value Ref Range Status  02/01/2022 124 (H) mg/dL (calc) Final    Comment:    Reference range: <100 . Desirable range <100 mg/dL for primary prevention;   <70 mg/dL for patients with CHD or diabetic patients  with > or = 2 CHD risk factors. Marland Kitchen LDL-C is now calculated using the Martin-Hopkins  calculation, which is a validated novel method providing  better accuracy than the Friedewald equation in the  estimation of LDL-C.  Horald Pollen et al. Lenox Ahr. 1610;960(45): 2061-2068  (http://education.[facility].com/faq/FAQ164)    HDL  Date Value Ref Range Status  02/01/2022 41 > OR = 40 mg/dL Final  40/98/1191 31 (L) >39 mg/dL Final   Triglycerides  Date Value Ref Range Status  02/01/2022 182 (H) <150 mg/dL Final         Passed - Patient is not pregnant      Passed - Valid encounter within last 12 months    Recent Outpatient Visits           1 month ago Type 2 diabetes mellitus without complication, without long-term current use of insulin (HCC)   [place] [facility] Grayce Sessions, NP   8 months ago Essential hypertension   [place] [facility] Grayce Sessions, NP   1 year ago Diabetes mellitus due to underlying condition with hyperosmolarity without coma, without long-term current use of insulin (HCC)   [place] [facility] Grayce Sessions, NP   1 year ago  Hyperlipidemia associated with type 2 diabetes mellitus (HCC)   [place] [facility] Grayce Sessions, NP   1 year ago Prediabetes   [place] [facility] Grayce Sessions, NP       Future Appointments             In 1 month Randa Evens, Kinnie Scales, NP [place] [facility]

## 2022-10-21 ENCOUNTER — Other Ambulatory Visit: Payer: Self-pay

## 2022-10-21 ENCOUNTER — Ambulatory Visit: Payer: 59

## 2022-11-01 ENCOUNTER — Other Ambulatory Visit (INDEPENDENT_AMBULATORY_CARE_PROVIDER_SITE_OTHER): Payer: Self-pay | Admitting: Primary Care

## 2022-11-01 DIAGNOSIS — E119 Type 2 diabetes mellitus without complications: Secondary | ICD-10-CM

## 2022-11-01 NOTE — Telephone Encounter (Signed)
Luke as I refilling medication it came up with interaction to  [facility] can I still refill?

## 2022-11-10 ENCOUNTER — Encounter (INDEPENDENT_AMBULATORY_CARE_PROVIDER_SITE_OTHER): Payer: Self-pay | Admitting: Primary Care

## 2022-11-10 ENCOUNTER — Ambulatory Visit (INDEPENDENT_AMBULATORY_CARE_PROVIDER_SITE_OTHER): Payer: 59 | Admitting: Primary Care

## 2022-11-10 VITALS — BP 123/71 | HR 70 | Resp 16 | Wt 181.0 lb

## 2022-11-10 DIAGNOSIS — I10 Essential (primary) hypertension: Secondary | ICD-10-CM

## 2022-11-10 DIAGNOSIS — Z7984 Long term (current) use of oral hypoglycemic drugs: Secondary | ICD-10-CM

## 2022-11-10 DIAGNOSIS — E119 Type 2 diabetes mellitus without complications: Secondary | ICD-10-CM | POA: Diagnosis not present

## 2022-11-10 DIAGNOSIS — E782 Mixed hyperlipidemia: Secondary | ICD-10-CM

## 2022-11-10 MED ORDER — LISINOPRIL 40 MG PO TABS
ORAL_TABLET | ORAL | 1 refills | Status: DC
Start: 2022-11-10 — End: 2023-08-16

## 2022-11-10 MED ORDER — METFORMIN HCL ER 500 MG PO TB24
500.0000 mg | ORAL_TABLET | Freq: Every day | ORAL | 1 refills | Status: DC
Start: 2022-11-10 — End: 2023-08-16

## 2022-11-10 MED ORDER — ATORVASTATIN CALCIUM 40 MG PO TABS: 40.0000 mg | ORAL_TABLET | Freq: Every day | ORAL | 1 refills | Status: DC

## 2022-11-14 NOTE — Progress Notes (Signed)
[facility]  Juan Johnston, is a 72 y.o. male  TCY:[id number]  PET:[id number]  DOB - 09/05/1950  Chief Complaint  Patient presents with   Hypertension   Diabetes       Subjective:   Juan Johnston is a 72 y.o. male here today for a follow up visit. HTN-Patient has No headache, No chest pain, No abdominal pain - No Nausea, No new weakness tingling or numbness, No Cough - shortness of breath T2D-Denies polyuria, polydipsia, polyphasia or vision changes.  Does not check blood sugars at home.  No problems updated.  No Known Allergies  Past Medical History:  Diagnosis Date   HIV infection (HCC)    Thyroid goiter     Current Outpatient Medications on File Prior to Visit  Medication Sig Dispense Refill   aspirin 81 MG chewable tablet Chew 1 tablet (81 mg total) by mouth daily. 30 tablet 0   bictegravir-emtricitabine-tenofovir AF (BIKTARVY) 50-200-25 MG TABS tablet Take 1 tablet by mouth daily. 30 tablet 6   triamcinolone ointment (KENALOG) 0.5 % Apply 1 Application topically 2 (two) times daily. 30 g 0   Zoster Vaccine Adjuvanted [facility]) injection Inject 0.5 mLs into the muscle every 8 (eight) weeks. (Patient not taking: Reported on 08/25/2022) 0.5 mL 1   No current facility-administered medications on file prior to visit.    Objective:   Vitals:   11/10/22 1355  BP: 123/71  Pulse: 70  Resp: 16  SpO2: 98%  Weight: 181 lb (82.1 kg)    Comprehensive ROS Pertinent positive and negative noted in HPI   Exam General appearance : Awake, alert, not in any distress. Speech Clear. Not toxic looking HEENT: Atraumatic and Normocephalic, pupils equally reactive to light and accomodation Neck: Supple, no JVD. No cervical lymphadenopathy.  Chest: Good air entry bilaterally, no added sounds  CVS: S1 S2 regular, no murmurs.  Abdomen: Bowel sounds present, Non tender and not distended with no gaurding, rigidity or rebound. Extremities: B/L Lower Ext shows no  edema, both legs are warm to touch Neurology: Awake alert, and oriented X 3, CN II-XII intact, Non focal Skin: No Rash  Data Review Lab Results  Component Value Date   HGBA1C 6.5 08/10/2022   HGBA1C 6.3 (A) 08/05/2021   HGBA1C 6.8 (A) 04/10/2021   Blood Pressure 123/71   Pulse 70   Respiration 16   Weight 181 lb (82.1 kg)   Oxygen Saturation 98%   Body Mass Index 28.35 kg/m   Assessment & Plan   [name] was seen today for hypertension and diabetes.  Diagnoses and all orders for this visit:  Mixed hyperlipidemia -     atorvastatin (LIPITOR) 40 MG tablet; Take 1 tablet (40 mg total) by mouth daily.  Essential hypertension Well controlled on dash diet excercising and adherent with medication  -     lisinopril (ZESTRIL) 40 MG tablet; TAKE 1 TABLET(40 MG) BY MOUTH DAILY  Type 2 diabetes mellitus without complication, without long-term current use of insulin (HCC) - educated on lifestyle modifications, including but not limited to diet choices and adding exercise to daily routine.   -     metFORMIN (GLUCOPHAGE-XR) 500 MG 24 hr tablet; Take 1 tablet (500 mg total) by mouth daily with breakfast.     Patient have been counseled extensively about nutrition and exercise. Other issues discussed during this visit include: low cholesterol diet, weight control and daily exercise, foot care, annual eye examinations at Ophthalmology, importance of adherence with medications and regular follow-up.  We also discussed long term complications of uncontrolled diabetes and hypertension.   Return in about 3 months (around 02/10/2023) for DM/HTN.  The patient was given clear instructions to go to ER or return to medical center if symptoms don't improve, worsen or new problems develop. The patient verbalized understanding. The patient was told to call to get lab results if they haven't heard anything in the next week.   This note has been created with Personnel officer. Any transcriptional errors are unintentional.   Juan Sessions, NP 11/14/2022, 11:48 PM

## 2022-12-14 ENCOUNTER — Ambulatory Visit (INDEPENDENT_AMBULATORY_CARE_PROVIDER_SITE_OTHER): Payer: 59

## 2022-12-14 ENCOUNTER — Encounter (INDEPENDENT_AMBULATORY_CARE_PROVIDER_SITE_OTHER): Payer: Self-pay

## 2022-12-14 DIAGNOSIS — Z111 Encounter for screening for respiratory tuberculosis: Secondary | ICD-10-CM | POA: Diagnosis not present

## 2022-12-14 NOTE — Progress Notes (Unsigned)
Tuberculin skin test applied to left ventral forearm.  Patient informed to schedule appt for nurse visit in 48-72 hours to have site read.  Herbert Deaner, RMA

## 2022-12-14 NOTE — Patient Instructions (Signed)
Cholecystostomy Cholecystostomy is a procedure to drain fluid from the gallbladder using a flexible drainage tube (catheter). The gallbladder is a pear-shaped organ that lies beneath the liver on the right side of the body. The gallbladder stores a fluid that helps the body digest fats (bile). You may need this procedure: If your gallbladder is irritated and swollen (cholecystitis). This condition may be caused by stones or lumps that form in the gallbladder (gallstones). Gallstones can block the tube (duct) that carries bile out of your gallbladder. To control a gallbladder infection if you cannot have gallbladder surgery. Tell a health care provider about: Any allergies you have. All medicines you are taking, including vitamins, herbs, eye drops, creams, and over-the-counter medicines. Any problems you or family members have had with anesthetic medicines. Any bleeding problems you have. Any surgeries you have had. Any medical conditions you have. Whether you are pregnant or may be pregnant. What are the risks? Generally, this is a safe procedure. However, problems may occur, including: The catheter moving out of place or becoming clogged. Infection at the incision site or inside the abdomen (peritonitis). Internal bleeding. Leakage of bile from the gallbladder. Damage to other structures or organs. Low blood pressure and slowed heart rate. Allergic reactions to medicines or dyes. What happens before the procedure? When to stop eating and drinking Follow instructions from your health care provider about what you may eat and drink before your procedure. These may include: 8 hours before your procedure Stop eating most foods. Do not eat meat, fried foods, or fatty foods. Eat only light foods, such as toast or crackers. All liquids are okay except energy drinks and alcohol. 6 hours before your procedure Stop eating. Drink only clear liquids, such as water, clear fruit juice, black coffee,  plain tea, and sports drinks. Do not drink energy drinks or alcohol. 2 hours before your procedure Stop drinking all liquids. You may be allowed to take medicines with small sips of water. If you do not follow your health care provider's instructions, your procedure may be delayed or canceled. Medicines Ask your health care provider about: Changing or stopping your regular medicines. This is especially important if you are taking diabetes medicines or blood thinners. Taking medicines such as aspirin and ibuprofen. These medicines can thin your blood. Do not take these medicines unless your health care provider tells you to take them. Taking over-the-counter medicines, vitamins, herbs, and supplements. General instructions You may have an exam or testing, including: Imaging studies of your abdomen and gallbladder. Blood tests. Do not use any products that contain nicotine or tobacco. These products include cigarettes, chewing tobacco, and vaping devices, such as e-cigarettes. If you need help quitting, ask your health care provider. If you will be going home right after the procedure, plan to have a responsible adult: Take you home from the hospital or clinic. You will not be allowed to drive. Care for you for the time you are told. Ask your health care provider: How your surgery site will be marked. What steps will be taken to help prevent infection. These steps may include: Removing hair at the surgery site. Washing skin with a germ-killing soap. Taking antibiotic medicine. What happens during the procedure? An IV will be inserted into one of your veins. You will be given one or more of the following: A medicine to help you relax (sedative). A medicine to numb the area (local anesthetic). A medicine to make you fall asleep (general anesthetic). A small incision will   be made in your abdomen, above your gallbladder. A long needle or a wide puncturing tool (trocar) will be put through  the incision. Your health care provider will use an imaging study (ultrasound or CT scan) to guide the needle or trocar into your gallbladder. After the needle or trocar is in your gallbladder, a small amount of dye may be injected. An X-ray may be taken to make sure that the needle is in the correct place. A catheter will be placed into your gallbladder. Your health care provider may remove any gallstones, if they are present. The needle or trocar will be removed. The catheter will be secured to your skin with stitches (sutures). The catheter will be connected to a drainage bag. Bile will drain from the gallbladder into the bag. Some bile may be sent to the lab to be tested. A bandage (dressing) will be placed over the incision site where the catheter was placed. The procedure may vary among health care providers and hospitals. What happens after the procedure? Your blood pressure, heart rate, breathing rate, and blood oxygen level will be monitored until you leave the hospital or clinic. Dye may be injected through your catheter to check the catheter and your gallbladder. Your gallbladder may be flushed out (irrigated) through the catheter. Your catheter and drainage bag may need to stay in place for several weeks or as told by your health care provider. You will be given IV fluids. Summary Cholecystostomy is a procedure to drain bile from the gallbladder using a flexible drainage tube (catheter). Generally, this is a safe procedure. However, problems may occur, including bleeding, infection, clogging or dislodging of the catheter, damage to other structures or organs, or low blood pressure and slowed heart rate. Follow instructions before the procedure. You will be told when to stop all food and drink, whether to change or stop any medicines, and what tests need to be done. After the procedure, you will be monitored in the hospital or clinic, and you may have a catheter and drainage bag when  you go home. This information is not intended to replace advice given to you by your health care provider. Make sure you discuss any questions you have with your health care provider. Document Revised: 09/23/2020 Document Reviewed: 09/23/2020 Elsevier Patient Education  2024 [employer].

## 2022-12-16 ENCOUNTER — Ambulatory Visit (INDEPENDENT_AMBULATORY_CARE_PROVIDER_SITE_OTHER): Payer: 59

## 2022-12-16 DIAGNOSIS — Z111 Encounter for screening for respiratory tuberculosis: Secondary | ICD-10-CM

## 2022-12-16 LAB — TB SKIN TEST
Induration: 0 mm
TB Skin Test: NEGATIVE

## 2022-12-25 ENCOUNTER — Ambulatory Visit (HOSPITAL_COMMUNITY)
Admission: EM | Admit: 2022-12-25 | Discharge: 2022-12-25 | Disposition: A | Discharge status: Home / Self Care | Payer: 59 | Attending: Internal Medicine | Admitting: Internal Medicine

## 2022-12-25 ENCOUNTER — Encounter (HOSPITAL_COMMUNITY): Payer: Self-pay

## 2022-12-25 DIAGNOSIS — R21 Rash and other nonspecific skin eruption: Secondary | ICD-10-CM | POA: Diagnosis present

## 2022-12-25 LAB — COMPREHENSIVE METABOLIC PANEL
ALT: 32 U/L (ref 0–44)
AST: 23 U/L (ref 15–41)
Albumin: 3.7 g/dL (ref 3.5–5.0)
Alkaline Phosphatase: 74 U/L (ref 38–126)
Anion gap: 8 (ref 5–15)
BUN: 19 mg/dL (ref 8–23)
CO2: 25 mmol/L (ref 22–32)
Calcium: 9.5 mg/dL (ref 8.9–10.3)
Chloride: 105 mmol/L (ref 98–111)
Creatinine, Ser: 1.32 mg/dL — ABNORMAL HIGH (ref 0.61–1.24)
GFR, Estimated: 57 mL/min — ABNORMAL LOW (ref >60)
Glucose, Bld: 144 mg/dL — ABNORMAL HIGH (ref 70–99)
Potassium: 3.8 mmol/L (ref 3.5–5.1)
Sodium: 138 mmol/L (ref 135–145)
Total Bilirubin: 0.7 mg/dL (ref 0.3–1.2)
Total Protein: 6.4 g/dL — ABNORMAL LOW (ref 6.5–8.1)

## 2022-12-25 LAB — CBC WITH DIFFERENTIAL/PLATELET
Abs Immature Granulocytes: 0.02 10*3/uL (ref 0.00–0.07)
Basophils Absolute: 0 10*3/uL (ref 0.0–0.1)
Basophils Relative: 1 %
Eosinophils Absolute: 0.2 10*3/uL (ref 0.0–0.5)
Eosinophils Relative: 3 %
HCT: 41.6 % (ref 39.0–52.0)
Hemoglobin: 14.1 g/dL (ref 13.0–17.0)
Immature Granulocytes: 0 %
Lymphocytes Relative: 25 %
Lymphs Abs: 1.8 10*3/uL (ref 0.7–4.0)
MCH: 32.3 pg (ref 26.0–34.0)
MCHC: 33.9 g/dL (ref 30.0–36.0)
MCV: 95.2 fL (ref 80.0–100.0)
Monocytes Absolute: 0.4 10*3/uL (ref 0.1–1.0)
Monocytes Relative: 6 %
Neutro Abs: 4.7 10*3/uL (ref 1.7–7.7)
Neutrophils Relative %: 65 %
Platelets: 181 10*3/uL (ref 150–400)
RBC: 4.37 MIL/uL (ref 4.22–5.81)
RDW: 13.1 % (ref 11.5–15.5)
WBC: 7.2 10*3/uL (ref 4.0–10.5)
nRBC: 0 % (ref 0.0–0.2)

## 2022-12-25 LAB — SEDIMENTATION RATE: Sed Rate: 6 mm/hr (ref 0–16)

## 2022-12-25 LAB — C-REACTIVE PROTEIN: CRP: 0.5 mg/dL (ref <1.0)

## 2022-12-25 MED ORDER — TRIAMCINOLONE ACETONIDE 0.5 % EX OINT: 1.0000 | TOPICAL_OINTMENT | Freq: Two times a day (BID) | CUTANEOUS | 0 refills | Status: DC

## 2022-12-25 NOTE — ED Triage Notes (Signed)
Pt is here for bilateral foot pain and swelling. Pt states he has blisters on the bottom of his feet.

## 2022-12-25 NOTE — ED Provider Notes (Signed)
[facility]    CSN: [id number] Arrival date & time: 12/25/22  1532      History   Chief Complaint Chief Complaint  Patient presents with   Foot Pain    HPI Juan Johnston is a 72 y.o. male.   72 year old male who presents to urgent care with complaints of painful blisters on the plantar aspects of both feet.  He reports that this has been ongoing for at least 8 to 9 months.  He has seen his primary care physician who prescribed him Augmentin and a cream that did not help much.  It did help the symptoms temporarily but they returned.  He denies any history of syphilis.  He does have diabetes but this is well-controlled.     Foot Pain Pertinent negatives include no chest pain, no abdominal pain and no shortness of breath.    Past Medical History:  Diagnosis Date   HIV infection (HCC)    Thyroid goiter     Patient Active Problem List   Diagnosis Date Noted   Shingles 03/23/2022   Contact dermatitis and eczema 03/23/2022   Essential hypertension 11/10/2020   Erectile dysfunction due to diseases classified elsewhere 03/26/2019   Rash and nonspecific skin eruption 12/12/2018   Hyperlipidemia associated with type 2 diabetes mellitus (HCC) 05/11/2018   Leg DVT (deep venous thromboembolism), chronic, right (HCC) 07/20/2017   Asymptomatic HIV infection (HCC) 07/19/2017   GERD (gastroesophageal reflux disease) 07/19/2017   Cough 07/15/2017   Thyroid goiter 05/19/2017   HIV disease (HCC) 04/21/2017   Healthcare maintenance 04/21/2017   Hyperlipidemia 04/21/2017    Past Surgical History:  Procedure Laterality Date   CORONARY ULTRASOUND/IVUS N/A 07/21/2017   Procedure: Intravascular Ultrasound/IVUS;  Surgeon: Maeola Harman, MD;  Location: [facility] INVASIVE CV LAB;  Service: Cardiovascular;  Laterality: N/A;   PERIPHERAL VASCULAR THROMBECTOMY N/A 07/20/2017   Procedure: PERIPHERAL VASCULAR THROMBECTOMY;  Surgeon: Nada Libman, MD;  Location: [facility] INVASIVE CV  LAB;  Service: Cardiovascular;  Laterality: N/A;   PERIPHERAL VASCULAR THROMBECTOMY N/A 07/21/2017   Procedure: PERIPHERAL VASCULAR THROMBECTOMY - LYSIS RECHECK;  Surgeon: Maeola Harman, MD;  Location: [facility] INVASIVE CV LAB;  Service: Cardiovascular;  Laterality: N/A;   scrotum cyst         Home Medications    Prior to Admission medications   Medication Sig Start Date End Date Taking? Authorizing Provider  aspirin 81 MG chewable tablet Chew 1 tablet (81 mg total) by mouth daily. 07/23/17  Yes Alison Murray, MD  atorvastatin (LIPITOR) 40 MG tablet Take 1 tablet (40 mg total) by mouth daily. 11/10/22  Yes Grayce Sessions, NP  bictegravir-emtricitabine-tenofovir AF (BIKTARVY) 50-200-25 MG TABS tablet Take 1 tablet by mouth daily. 08/25/22  Yes Veryl Speak, FNP  lisinopril (ZESTRIL) 40 MG tablet TAKE 1 TABLET(40 MG) BY MOUTH DAILY 11/10/22  Yes Grayce Sessions, NP  metFORMIN (GLUCOPHAGE-XR) 500 MG 24 hr tablet Take 1 tablet (500 mg total) by mouth daily with breakfast. 11/10/22  Yes Grayce Sessions, NP  triamcinolone ointment (KENALOG) 0.5 % Apply 1 Application topically 2 (two) times daily. 03/23/22   Blanchard Kelch, NP  Zoster Vaccine Adjuvanted [facility]) injection Inject 0.5 mLs into the muscle every 8 (eight) weeks. Patient not taking: Reported on 08/25/2022 04/20/22   Blanchard Kelch, NP    Family History Family History  Problem Relation Age of Onset   Hypertension Mother     Social History Social History   Tobacco  Use   Smoking status: Some Days    Current packs/day: 0.00    Types: Cigarettes    Last attempt to quit: 02/03/2017    Years since quitting: 5.8   Smokeless tobacco: Never   Tobacco comments:    "Does not inhale"; states working on cutting back  Vaping Use   Vaping status: Never Used  Substance Use Topics   Alcohol use: Yes    Comment: occasional   Drug use: No     Allergies   Patient has no known allergies.   Review of  Systems Review of Systems  Constitutional:  Negative for chills and fever.  HENT:  Negative for ear pain and sore throat.   Eyes:  Negative for pain and visual disturbance.  Respiratory:  Negative for cough and shortness of breath.   Cardiovascular:  Negative for chest pain and palpitations.  Gastrointestinal:  Negative for abdominal pain and vomiting.  Genitourinary:  Negative for dysuria and hematuria.  Musculoskeletal:  Negative for arthralgias and back pain.  Skin:  Positive for color change and rash.  Neurological:  Negative for seizures and syncope.  All other systems reviewed and are negative.    Physical Exam Triage Vital Signs ED Triage Vitals [12/25/22 1556]  Encounter Vitals Group     BP (!) 160/80     Systolic BP Percentile      Diastolic BP Percentile      Pulse Rate 72     Resp 16     Temp 98.2 F (36.8 C)     Temp Source Oral     SpO2 98 %     Weight      Height      Head Circumference      Peak Flow      Pain Score      Pain Loc      Pain Education      Exclude from Growth Chart    No data found.  Updated Vital Signs BP (!) 160/80 (BP Location: Left Arm)   Pulse 72   Temp 98.2 F (36.8 C) (Oral)   Resp 16   SpO2 98%   Visual Acuity Right Eye Distance:   Left Eye Distance:   Bilateral Distance:    Right Eye Near:   Left Eye Near:    Bilateral Near:     Physical Exam Vitals and nursing note reviewed.  Constitutional:      General: He is not in acute distress.    Appearance: He is well-developed.  HENT:     Head: Normocephalic and atraumatic.  Eyes:     Conjunctiva/sclera: Conjunctivae normal.  Cardiovascular:     Rate and Rhythm: Normal rate and regular rhythm.     Heart sounds: No murmur heard. Pulmonary:     Effort: Pulmonary effort is normal. No respiratory distress.     Breath sounds: Normal breath sounds.  Abdominal:     Palpations: Abdomen is soft.     Tenderness: There is no abdominal tenderness.  Musculoskeletal:         General: No swelling.     Cervical back: Neck supple.  Skin:    General: Skin is warm and dry.     Capillary Refill: Capillary refill takes less than 2 seconds.     Comments: Fluid-filled blisters on the plantar aspects of both feet with the left being much worse than the right.  The areas are tender to the touch.  Neurological:     Mental  Status: He is alert.  Psychiatric:        Mood and Affect: Mood normal.          UC Treatments / Results  Labs (all labs ordered are listed, but only abnormal results are displayed) Labs Reviewed - No data to display  EKG   Radiology No results found.  Procedures Procedures (including critical care time)  Medications Ordered in UC Medications - No data to display  Initial Impression / Assessment and Plan / UC Course  I have reviewed the triage vital signs and the nursing notes.  Pertinent labs & imaging results that were available during my care of the patient were reviewed by me and considered in my medical decision making (see chart for details).     Skin eruption/blisters of the plantar aspects of both  feet: After looking through the patient's chart, it does appear that he had something similar to this in 2020 and was seen at podiatry where he had a biopsy showing inflammatory skin eruptions.  He was treated at that time with triamcinolone which did improve the symptoms.  The patient does not recall this.  Today we will draw lab work on the patient including inflammatory markers, CBC and CMP.  Will start him on triamcinolone ointment twice daily and place referral to podiatry who has seen him in the past for something similar.  Patient should return to urgent care if his symptoms worsen or fail to improve. Final Clinical Impressions(s) / UC Diagnoses   Final diagnoses:  None   Discharge Instructions   None    ED Prescriptions   None    PDMP not reviewed this encounter.   Landis Martins, [place] 12/25/22 1706

## 2022-12-25 NOTE — Discharge Instructions (Signed)
Use triamcinolone ointment twice daily on plantar feet.  Make follow up appointment with Podiatry (referral placed but may need to call their office if they do not call you).  Labwork taken today and these results will take a few days to result. We will contact you with abnormal results. Negative results will be available on your mychart.

## 2022-12-26 LAB — RPR: RPR Ser Ql: NONREACTIVE

## 2023-01-05 ENCOUNTER — Encounter: Payer: Self-pay | Admitting: Podiatry

## 2023-01-05 ENCOUNTER — Ambulatory Visit (INDEPENDENT_AMBULATORY_CARE_PROVIDER_SITE_OTHER): Payer: 59 | Admitting: Podiatry

## 2023-01-05 DIAGNOSIS — L259 Unspecified contact dermatitis, unspecified cause: Secondary | ICD-10-CM

## 2023-01-05 MED ORDER — TRIAMCINOLONE ACETONIDE 0.5 % EX OINT: 1.0000 | TOPICAL_OINTMENT | Freq: Two times a day (BID) | CUTANEOUS | 0 refills | Status: DC

## 2023-01-05 MED ORDER — METHYLPREDNISOLONE 4 MG PO TBPK: ORAL_TABLET | ORAL | 0 refills | Status: DC

## 2023-01-05 NOTE — Progress Notes (Signed)
  Subjective:  Patient ID: Juan Johnston, male    DOB: 1951-01-23,   MRN: [id number]  No chief complaint on file.   72 y.o. male presents for concern of eruptions in his feet that started several weeks ago. Relates he has had in the past and Dr. Samuella Cota has treated with a cream. He was recently seen in urgent  care and started on triamcinolone cream. Relates helping a bit but still present. Relates they have been painful. History of HIV . Denies any other pedal complaints. Denies n/v/f/c.   Past Medical History:  Diagnosis Date   HIV infection (HCC)    Thyroid goiter     Objective:  Physical Exam: Vascular: DP/PT pulses 2/4 bilateral. CFT <3 seconds. Normal hair growth on digits. No edema.  Skin. No lacerations or abrasions bilateral feet. Multiple erythemetous scaling areas with bliaster and pain to palpation. Improving on left but still prominent on the right foot.  Musculoskeletal: MMT 5/5 bilateral lower extremities in DF, PF, Inversion and Eversion. Deceased ROM in DF of ankle joint.  Neurological: Sensation intact to light touch.   Assessment:   1. Contact dermatitis and eczema      Plan:  Patient was evaluated and treated and all questions answered. -Examined patient -Discussed treatment options for inflammatory dermatitis.  -Discussed continued with triamcinolone and refill provided.  -Discussed trying oral medrol dose pack to see if we can improve symptoms further.  -Patient to return in 3 weeks for recechk.    Louann Sjogren, DPM

## 2023-01-26 ENCOUNTER — Encounter: Payer: Self-pay | Admitting: Podiatry

## 2023-01-26 ENCOUNTER — Ambulatory Visit (INDEPENDENT_AMBULATORY_CARE_PROVIDER_SITE_OTHER): Payer: 59 | Admitting: Podiatry

## 2023-01-26 DIAGNOSIS — L259 Unspecified contact dermatitis, unspecified cause: Secondary | ICD-10-CM

## 2023-01-26 MED ORDER — TRIAMCINOLONE ACETONIDE 0.5 % EX OINT: 1.0000 | TOPICAL_OINTMENT | Freq: Two times a day (BID) | CUTANEOUS | 6 refills | Status: AC

## 2023-01-26 NOTE — Progress Notes (Signed)
  Subjective:  Patient ID: Juan Johnston, male    DOB: 12-18-1950,   MRN: [id number]  No chief complaint on file.   72 y.o. male presents for follow-up of dermatitis on both feet. Relates medrol dose pack helped and the cream has bee helping. Relates he is out of cream. History of HIV . Denies any other pedal complaints. Denies n/v/f/c.   Past Medical History:  Diagnosis Date   HIV infection (HCC)    Thyroid goiter     Objective:  Physical Exam: Vascular: DP/PT pulses 2/4 bilateral. CFT <3 seconds. Normal hair growth on digits. No edema.  Skin. No lacerations or abrasions bilateral feet. Multiple erythemetous scaling areas with bliaster and pain to palpation. Improving on both feet.   Musculoskeletal: MMT 5/5 bilateral lower extremities in DF, PF, Inversion and Eversion. Deceased ROM in DF of ankle joint.  Neurological: Sensation intact to light touch.   Assessment:   1. Contact dermatitis and eczema       Plan:  Patient was evaluated and treated and all questions answered. -Examined patient -Discussed treatment options for inflammatory dermatitis.  -Discussed continued with triamcinolone and refill provided.  -Feel we are nearly resolved and should do well from now on . Marland Kitchen    Louann Sjogren, DPM

## 2023-02-02 ENCOUNTER — Other Ambulatory Visit: Payer: Self-pay | Admitting: Family

## 2023-02-02 DIAGNOSIS — B20 Human immunodeficiency virus [HIV] disease: Secondary | ICD-10-CM

## 2023-02-08 ENCOUNTER — Ambulatory Visit (INDEPENDENT_AMBULATORY_CARE_PROVIDER_SITE_OTHER): Payer: 59 | Admitting: Family

## 2023-02-08 ENCOUNTER — Other Ambulatory Visit: Payer: Self-pay

## 2023-02-08 ENCOUNTER — Encounter: Payer: Self-pay | Admitting: Family

## 2023-02-08 VITALS — BP 118/74 | HR 72 | Temp 97.9°F | Wt 180.0 lb

## 2023-02-08 DIAGNOSIS — B20 Human immunodeficiency virus [HIV] disease: Secondary | ICD-10-CM

## 2023-02-08 DIAGNOSIS — Z0000 Encounter for general adult medical examination without abnormal findings: Secondary | ICD-10-CM

## 2023-02-08 MED ORDER — BIKTARVY 50-200-25 MG PO TABS: 1.0000 | ORAL_TABLET | Freq: Every day | ORAL | 6 refills | Status: DC

## 2023-02-08 NOTE — Assessment & Plan Note (Signed)
Mr. Sweitzer continues to have well-controlled virus with good adherence and tolerance to Biktarvy.  Reviewed previous lab work and discussed plan of care and U equals U.  Check blood work.  Continue current dose of Biktarvy.  Plan for follow-up in 6 months or sooner if needed with lab work on the same day.

## 2023-02-08 NOTE — Patient Instructions (Signed)
Nice to see you. ? ?We will check your lab work today. ? ?Continue to take your medication daily as prescribed. ? ?Refills have been sent to the pharmacy. ? ?Plan for follow up in 6 months or sooner if needed with lab work on the same day. ? ?Have a great day and stay safe! ? ?

## 2023-02-08 NOTE — Progress Notes (Signed)
Brief Narrative   Patient ID: Juan Johnston, male    DOB: 1950-05-12, 72 y.o.   MRN: [id number]  Juan Johnston is a 72 year old African-American male diagnosed with HIV disease in December 2018 with risk factor of MSM.  Initial CD4 count of 70 8 and viral load of 1.2 million.  Initial genotype performed on 04/21/2017 with K103 (efavirenz and nevirapine) and E138G (rilpivirine).  [id number] negative.  Initial regimen of Symtuza.  Entered care at [facility] stage III.  Now on Biktarvy.   Subjective:    Chief Complaint  Patient presents with   Follow-up    HPI:  Juan Johnston is a 72 y.o. male with HIV disease last seen on 08/25/22 with well controlled virus and good adherence and tolerance to Biktarvy. Viral load was undetectable and CD4 count 454. Kidney function, liver function, and electrolytes within normal ranges. Here today for follow up.   Juan Johnston has been doing okay since his last office visit and continues to take [facility] as prescribed with no adverse side effects or problems obtaining medication from the pharmacy.  Has had some challenges picking up other medications including lisinopril and metformin.  Continues to stay busy with yard work and assisting family members getting to work and appointments.  No new concerns/complaints.  Condoms and STD testing offered.  Vaccinations reviewed.  Healthcare maintenance reviewed.  Denies fevers, chills, night sweats, headaches, changes in vision, neck pain/stiffness, nausea, diarrhea, vomiting, lesions or rashes.  Lab Results  Component Value Date   CD4TCELL 29 (L) 08/25/2022   CD4TABS 454 02/01/2022   Lab Results  Component Value Date   HIV1RNAQUANT 25 (H) 08/25/2022     No Known Allergies    Outpatient Medications Prior to Visit  Medication Sig Dispense Refill   aspirin 81 MG chewable tablet Chew 1 tablet (81 mg total) by mouth daily. 30 tablet 0   atorvastatin (LIPITOR) 40 MG tablet Take 1 tablet (40 mg total) by mouth  daily. 90 tablet 1   lisinopril (ZESTRIL) 40 MG tablet TAKE 1 TABLET(40 MG) BY MOUTH DAILY 90 tablet 1   metFORMIN (GLUCOPHAGE-XR) 500 MG 24 hr tablet Take 1 tablet (500 mg total) by mouth daily with breakfast. 90 tablet 1   methylPREDNISolone (MEDROL DOSEPAK) 4 MG TBPK tablet Take as directed 21 tablet 0   triamcinolone ointment (KENALOG) 0.5 % Apply 1 Application topically 2 (two) times daily. 45 g 6   bictegravir-emtricitabine-tenofovir AF (BIKTARVY) 50-200-25 MG TABS tablet Take 1 tablet by mouth daily. 30 tablet 6   Zoster Vaccine Adjuvanted [facility]) injection Inject 0.5 mLs into the muscle every 8 (eight) weeks. (Patient not taking: Reported on 08/25/2022) 0.5 mL 1   No facility-administered medications prior to visit.     Past Medical History:  Diagnosis Date   HIV infection (HCC)    Thyroid goiter      Past Surgical History:  Procedure Laterality Date   CORONARY ULTRASOUND/IVUS N/A 07/21/2017   Procedure: Intravascular Ultrasound/IVUS;  Surgeon: Maeola Harman, MD;  Location: [facility] INVASIVE CV LAB;  Service: Cardiovascular;  Laterality: N/A;   PERIPHERAL VASCULAR THROMBECTOMY N/A 07/20/2017   Procedure: PERIPHERAL VASCULAR THROMBECTOMY;  Surgeon: Nada Libman, MD;  Location: [facility] INVASIVE CV LAB;  Service: Cardiovascular;  Laterality: N/A;   PERIPHERAL VASCULAR THROMBECTOMY N/A 07/21/2017   Procedure: PERIPHERAL VASCULAR THROMBECTOMY - LYSIS RECHECK;  Surgeon: Maeola Harman, MD;  Location: [facility] INVASIVE CV LAB;  Service: Cardiovascular;  Laterality: N/A;   scrotum cyst  Review of Systems  Constitutional:  Negative for appetite change, chills, fatigue, fever and unexpected weight change.  Eyes:  Negative for visual disturbance.  Respiratory:  Negative for cough, chest tightness, shortness of breath and wheezing.   Cardiovascular:  Negative for chest pain and leg swelling.  Gastrointestinal:  Negative for abdominal pain, constipation, diarrhea,  nausea and vomiting.  Genitourinary:  Negative for dysuria, flank pain, frequency, genital sores, hematuria and urgency.  Skin:  Negative for rash.  Allergic/Immunologic: Negative for immunocompromised state.  Neurological:  Negative for dizziness and headaches.      Objective:    BP 118/74   Pulse 72   Temp 97.9 F (36.6 C) (Oral)   Wt 180 lb (81.6 kg)   SpO2 95%   BMI 28.19 kg/m  Nursing note and vital signs reviewed.  Physical Exam Constitutional:      General: He is not in acute distress.    Appearance: He is well-developed.  Eyes:     Conjunctiva/sclera: Conjunctivae normal.  Cardiovascular:     Rate and Rhythm: Normal rate and regular rhythm.     Heart sounds: Normal heart sounds. No murmur heard.    No friction rub. No gallop.  Pulmonary:     Effort: Pulmonary effort is normal. No respiratory distress.     Breath sounds: Normal breath sounds. No wheezing or rales.  Chest:     Chest wall: No tenderness.  Abdominal:     General: Bowel sounds are normal.     Palpations: Abdomen is soft.     Tenderness: There is no abdominal tenderness.  Musculoskeletal:     Cervical back: Neck supple.  Lymphadenopathy:     Cervical: No cervical adenopathy.  Skin:    General: Skin is warm and dry.     Findings: No rash.  Neurological:     Mental Status: He is alert and oriented to person, place, and time.  Psychiatric:        Behavior: Behavior normal.        Thought Content: Thought content normal.        Judgment: Judgment normal.         02/08/2023    2:39 PM 02/08/2023    2:38 PM 11/10/2022    1:55 PM 09/06/2022    3:45 PM 08/25/2022    2:39 PM  Depression screen PHQ 2/9  Decreased Interest 0 0 0 0 0  Down, Depressed, Hopeless 0 0 0 0 0  PHQ - 2 Score 0 0 0 0 0       Assessment & Plan:    Patient Active Problem List   Diagnosis Date Noted   Shingles 03/23/2022   Contact dermatitis and eczema 03/23/2022   Essential hypertension 11/10/2020   Erectile  dysfunction due to diseases classified elsewhere 03/26/2019   Rash and nonspecific skin eruption 12/12/2018   Hyperlipidemia associated with type 2 diabetes mellitus (HCC) 05/11/2018   Leg DVT (deep venous thromboembolism), chronic, right (HCC) 07/20/2017   Asymptomatic HIV infection (HCC) 07/19/2017   GERD (gastroesophageal reflux disease) 07/19/2017   Cough 07/15/2017   Thyroid goiter 05/19/2017   HIV disease (HCC) 04/21/2017   Healthcare maintenance 04/21/2017   Hyperlipidemia 04/21/2017     Problem List Items Addressed This Visit       Other   HIV disease [facility])    [name] continues to have well-controlled virus with good adherence and tolerance to Biktarvy.  Reviewed previous lab work and discussed plan of care and U  equals U.  Check blood work.  Continue current dose of Biktarvy.  Plan for follow-up in 6 months or sooner if needed with lab work on the same day.      Relevant Medications   bictegravir-emtricitabine-tenofovir AF (BIKTARVY) 50-200-25 MG TABS tablet   Other Relevant Orders   COMPLETE METABOLIC PANEL WITH GFR   HIV-1 RNA quant-no reflex-bld   T-helper cell (CD4)- ([facility] clinic only)   Healthcare maintenance - Primary    Discussed importance of safe sexual practice and condom use. Condoms and STD testing offered.  Vaccinations reviewed and up to date.  Colon cancer screening up to date per recommendations. Routine dental care up to date         I am having Juan Johnston maintain his aspirin, Zoster Vaccine Adjuvanted, atorvastatin, lisinopril, metFORMIN, methylPREDNISolone, triamcinolone ointment, and Biktarvy.   Meds ordered this encounter  Medications   bictegravir-emtricitabine-tenofovir AF (BIKTARVY) 50-200-25 MG TABS tablet    Sig: Take 1 tablet by mouth daily.    Dispense:  30 tablet    Refill:  6    Order Specific Question:   Supervising Provider    Answer:   Judyann Munson [[id number]]     Follow-up: Return in about 6 months (around  08/08/2023). or sooner if needed.    Marcos Eke, MSN, FNP-C Nurse Practitioner [facility] for Infectious Disease [facility] Main number: [phone]

## 2023-02-08 NOTE — Assessment & Plan Note (Signed)
Discussed importance of safe sexual practice and condom use. Condoms and STD testing offered.  Vaccinations reviewed and up to date.  Colon cancer screening up to date per recommendations. Routine dental care up to date

## 2023-02-09 LAB — T-HELPER CELL (CD4) - (RCID CLINIC ONLY)
CD4 % Helper T Cell: 30 % — ABNORMAL LOW (ref 33–65)
CD4 T Cell Abs: 490 /uL (ref 400–1790)

## 2023-02-10 ENCOUNTER — Other Ambulatory Visit (INDEPENDENT_AMBULATORY_CARE_PROVIDER_SITE_OTHER): Payer: Self-pay | Admitting: Family Medicine

## 2023-02-10 DIAGNOSIS — E119 Type 2 diabetes mellitus without complications: Secondary | ICD-10-CM

## 2023-02-10 LAB — COMPLETE METABOLIC PANEL WITH GFR
AG Ratio: 1.7 (calc) (ref 1.0–2.5)
ALT: 23 U/L (ref 9–46)
AST: 18 U/L (ref 10–35)
Albumin: 4.1 g/dL (ref 3.6–5.1)
Alkaline phosphatase (APISO): 78 U/L (ref 35–144)
BUN: 12 mg/dL (ref 7–25)
CO2: 25 mmol/L (ref 20–32)
Calcium: 9.4 mg/dL (ref 8.6–10.3)
Chloride: 104 mmol/L (ref 98–110)
Creat: 0.91 mg/dL (ref 0.70–1.28)
Globulin: 2.4 g/dL (ref 1.9–3.7)
Glucose, Bld: 116 mg/dL — ABNORMAL HIGH (ref 65–99)
Potassium: 4.3 mmol/L (ref 3.5–5.3)
Sodium: 139 mmol/L (ref 135–146)
Total Bilirubin: 0.5 mg/dL (ref 0.2–1.2)
Total Protein: 6.5 g/dL (ref 6.1–8.1)
eGFR: 90 mL/min/{1.73_m2} (ref >=60)

## 2023-02-10 LAB — HIV-1 RNA QUANT-NO REFLEX-BLD
HIV 1 RNA Quant: NOT DETECTED {copies}/mL
HIV-1 RNA Quant, Log: NOT DETECTED {Log_copies}/mL

## 2023-08-16 ENCOUNTER — Encounter: Payer: Self-pay | Admitting: Family

## 2023-08-16 ENCOUNTER — Other Ambulatory Visit: Payer: Self-pay

## 2023-08-16 ENCOUNTER — Ambulatory Visit: Payer: 59 | Admitting: Family

## 2023-08-16 VITALS — BP 136/76 | HR 71 | Temp 98.0°F | Ht 67.0 in | Wt 177.0 lb

## 2023-08-16 DIAGNOSIS — E119 Type 2 diabetes mellitus without complications: Secondary | ICD-10-CM

## 2023-08-16 DIAGNOSIS — E782 Mixed hyperlipidemia: Secondary | ICD-10-CM

## 2023-08-16 DIAGNOSIS — Z0000 Encounter for general adult medical examination without abnormal findings: Secondary | ICD-10-CM

## 2023-08-16 DIAGNOSIS — Z7984 Long term (current) use of oral hypoglycemic drugs: Secondary | ICD-10-CM | POA: Diagnosis not present

## 2023-08-16 DIAGNOSIS — B20 Human immunodeficiency virus [HIV] disease: Secondary | ICD-10-CM

## 2023-08-16 DIAGNOSIS — E785 Hyperlipidemia, unspecified: Secondary | ICD-10-CM | POA: Diagnosis not present

## 2023-08-16 DIAGNOSIS — I10 Essential (primary) hypertension: Secondary | ICD-10-CM

## 2023-08-16 DIAGNOSIS — N521 Erectile dysfunction due to diseases classified elsewhere: Secondary | ICD-10-CM

## 2023-08-16 MED ORDER — BIKTARVY 50-200-25 MG PO TABS: 1.0000 | ORAL_TABLET | Freq: Every day | ORAL | 6 refills | Status: DC

## 2023-08-16 MED ORDER — ATORVASTATIN CALCIUM 40 MG PO TABS
40.0000 mg | ORAL_TABLET | Freq: Every day | ORAL | 0 refills | Status: DC
Start: 2023-08-16 — End: 2023-11-15

## 2023-08-16 MED ORDER — LISINOPRIL 40 MG PO TABS
ORAL_TABLET | ORAL | 0 refills | Status: DC
Start: 2023-08-16 — End: 2023-11-15

## 2023-08-16 MED ORDER — METFORMIN HCL ER 500 MG PO TB24
500.0000 mg | ORAL_TABLET | Freq: Every day | ORAL | 0 refills | Status: DC
Start: 2023-08-16 — End: 2023-11-01

## 2023-08-16 NOTE — Assessment & Plan Note (Signed)
 Blood pressure appears adequately controlled with current regimen of lisinopril . No neurological/ophthalmologic signs/symptoms. Encouraged to monitor blood pressure at home. Continue current dose of lisinopril .

## 2023-08-16 NOTE — Assessment & Plan Note (Signed)
 Juan Johnston continues to have well controlled virus with good adherence and tolerance to Biktarvy .  Reviewed lab work and discussed plan of care, U equals U, and family planning. Social determinants of health reviewed and no interventions indicated. Check lab work. Continue current dose of Biktarvy . Plan for follow up in  6 months or sooner if needed with lab work on the same day.Juan Johnston

## 2023-08-16 NOTE — Assessment & Plan Note (Signed)
 Increased risk for cardiovascular disease and tolerating atorvastatin  with no adverse side effects or myalgias. Counseled on recommendation to continue atorvastatin  to reduce cardiovascular disease risk and HIV associated inflammation. Continue current dose of atorvastatin .

## 2023-08-16 NOTE — Assessment & Plan Note (Signed)
 Discussed importance of safe sexual practice and condom use. Condoms and site specific STD testing offered.  Vaccinations reviewed  Due for dental care - scheduling information provided in AVS.  Colon cancer screening up to date.

## 2023-08-16 NOTE — Patient Instructions (Addendum)
 Nice to see you.  We will check your lab work today.  Continue to take your medication daily as prescribed.  Refills have been sent to the pharmacy.  Please call [facility]) to schedule/follow up on your dental care at [phone]  Plan for follow up in 6 months or sooner if needed with lab work on the same day.  Have a great day and stay safe!    Smoking Cessation: QuitlineNC 1-800-QUIT-NOW (931)110-4708); Espaol: 1-855-Djelo-Ya (1-2030234790) http://carroll-castaneda.info/

## 2023-08-16 NOTE — Progress Notes (Signed)
 Brief Narrative   Patient ID: Juan Johnston, male    DOB: [birth year], 73 y.o.   MRN: [id number]  [name] is a 73 year old African-American male diagnosed with HIV disease in December 2018 with risk factor of MSM.  Initial CD4 count of 70 8 and viral load of 1.2 million.  Initial genotype performed on 04/21/2017 with K103 (efavirenz and nevirapine) and E138G (rilpivirine).  [id number] negative.  Initial regimen of Symtuza.  Entered care at [facility] stage III.  Now on Biktarvy .   Subjective:   Chief Complaint  Patient presents with   Follow-up    HPI:  Juan Johnston is a 73 y.o. male with HIV disease last seen on 02/08/2023 with well-controlled virus and good adherence and tolerance to Biktarvy .  Viral load was undetectable with CD4 count 490.  Kidney function, liver function, electrolytes within normal ranges.  ASCVD risk score of 59%.  Here today for routine follow-up.  Mr. Herzfeld has been doing well since his last office visit and continues to take Biktarvy  as prescribed with no adverse side effects or problems obtaining medication from the pharmacy.  Covered by [employer].  No new concerns/complaints.  Continues to smoke tobacco daily.  Healthcare maintenance reviewed.  Due for routine dental care.  Condoms and site-specific STD testing offered.   Denies fevers, chills, night sweats, headaches, changes in vision, neck pain/stiffness, nausea, diarrhea, vomiting, lesions or rashes.  Lab Results  Component Value Date   CD4TCELL 30 (L) 02/08/2023   CD4TABS 490 02/08/2023   Lab Results  Component Value Date   HIV1RNAQUANT Not Detected 02/08/2023     No Known Allergies    Outpatient Medications Prior to Visit  Medication Sig Dispense Refill   aspirin  81 MG chewable tablet Chew 1 tablet (81 mg total) by mouth daily. 30 tablet 0   triamcinolone  ointment (KENALOG ) 0.5 % Apply 1 Application topically 2 (two) times daily. 45 g 6   atorvastatin  (LIPITOR) 40  MG tablet Take 1 tablet (40 mg total) by mouth daily. 90 tablet 1   bictegravir-emtricitabine -tenofovir  AF (BIKTARVY ) 50-200-25 MG TABS tablet Take 1 tablet by mouth daily. 30 tablet 6   lisinopril  (ZESTRIL ) 40 MG tablet TAKE 1 TABLET(40 MG) BY MOUTH DAILY 90 tablet 1   metFORMIN  (GLUCOPHAGE -XR) 500 MG 24 hr tablet Take 1 tablet (500 mg total) by mouth daily with breakfast. 90 tablet 1   methylPREDNISolone  (MEDROL  DOSEPAK) 4 MG TBPK tablet Take as directed (Patient not taking: Reported on 08/16/2023) 21 tablet 0   Zoster Vaccine Adjuvanted [facility]) injection Inject 0.5 mLs into the muscle every 8 (eight) weeks. (Patient not taking: Reported on 08/16/2023) 0.5 mL 1   No facility-administered medications prior to visit.     Past Medical History:  Diagnosis Date   HIV infection (HCC)    Thyroid  goiter      Past Surgical History:  Procedure Laterality Date   CORONARY ULTRASOUND/IVUS N/A 07/21/2017   Procedure: Intravascular Ultrasound/IVUS;  Surgeon: Adine Hoof, MD;  Location: [facility] INVASIVE CV LAB;  Service: Cardiovascular;  Laterality: N/A;   PERIPHERAL VASCULAR THROMBECTOMY N/A 07/20/2017   Procedure: PERIPHERAL VASCULAR THROMBECTOMY;  Surgeon: Margherita Shell, MD;  Location: [facility] INVASIVE CV LAB;  Service: Cardiovascular;  Laterality: N/A;   PERIPHERAL VASCULAR THROMBECTOMY N/A 07/21/2017   Procedure: PERIPHERAL VASCULAR THROMBECTOMY - LYSIS RECHECK;  Surgeon: Adine Hoof, MD;  Location: [facility] INVASIVE CV LAB;  Service: Cardiovascular;  Laterality: N/A;   scrotum cyst  Review of Systems  Constitutional:  Negative for appetite change, chills, fatigue, fever and unexpected weight change.  Eyes:  Negative for visual disturbance.  Respiratory:  Negative for cough, chest tightness, shortness of breath and wheezing.   Cardiovascular:  Negative for chest pain and leg swelling.  Gastrointestinal:  Negative for abdominal pain, constipation, diarrhea, nausea and  vomiting.  Genitourinary:  Negative for dysuria, flank pain, frequency, genital sores, hematuria and urgency.  Skin:  Negative for rash.  Allergic/Immunologic: Negative for immunocompromised state.  Neurological:  Negative for dizziness and headaches.     Objective:   BP 136/76   Pulse 71   Temp 98 F (36.7 C) (Temporal)   Ht 5\' 7"  (1.702 m)   Wt 177 lb (80.3 kg)   SpO2 95%   BMI 27.72 kg/m  Nursing note and vital signs reviewed.  Physical Exam Constitutional:      General: He is not in acute distress.    Appearance: He is well-developed.  Eyes:     Conjunctiva/sclera: Conjunctivae normal.  Cardiovascular:     Rate and Rhythm: Normal rate and regular rhythm.     Heart sounds: Normal heart sounds. No murmur heard.    No friction rub. No gallop.  Pulmonary:     Effort: Pulmonary effort is normal. No respiratory distress.     Breath sounds: Normal breath sounds. No wheezing or rales.  Chest:     Chest wall: No tenderness.  Abdominal:     General: Bowel sounds are normal.     Palpations: Abdomen is soft.     Tenderness: There is no abdominal tenderness.  Musculoskeletal:     Cervical back: Neck supple.  Lymphadenopathy:     Cervical: No cervical adenopathy.  Skin:    General: Skin is warm and dry.     Findings: No rash.  Neurological:     Mental Status: He is alert and oriented to person, place, and time.  Psychiatric:        Behavior: Behavior normal.        Thought Content: Thought content normal.        Judgment: Judgment normal.          08/16/2023    1:41 PM 02/08/2023    2:39 PM 02/08/2023    2:38 PM 11/10/2022    1:55 PM 09/06/2022    3:45 PM  Depression screen PHQ 2/9  Decreased Interest 0 0 0 0 0  Down, Depressed, Hopeless 0 0 0 0 0  PHQ - 2 Score 0 0 0 0 0  Altered sleeping 0      Tired, decreased energy 0      Change in appetite 0      Feeling bad or failure about yourself  0      Trouble concentrating 0      Moving slowly or fidgety/restless 0       Suicidal thoughts 0      PHQ-9 Score 0            08/16/2023    1:41 PM 11/10/2022    1:55 PM 08/10/2022    2:11 PM 02/08/2022    2:22 PM  GAD 7 : Generalized Anxiety Score  Nervous, Anxious, on Edge 0 0 0 0  Control/stop worrying 0 0 0 0  Worry too much - different things 0 0 0 0  Trouble relaxing 0 0 0 0  Restless 0 0 0 0  Easily annoyed or irritable 0 0 0 0  Afraid - awful might happen 0 0 0 0  Total GAD 7 Score 0 0 0 0     The 10-year ASCVD risk score (Arnett DK, et al., 2019) is: 59.9%   Values used to calculate the score:     Age: 6 years     Sex: Male     Is Non-Hispanic African American: Yes     Diabetic: Yes     Tobacco smoker: Yes     Systolic Blood Pressure: 136 mmHg     Is BP treated: Yes     HDL Cholesterol: 41 mg/dL     Total Cholesterol: 195 mg/dL      Assessment & Plan:    Patient Active Problem List   Diagnosis Date Noted   Shingles 03/23/2022   Contact dermatitis and eczema 03/23/2022   Essential hypertension 11/10/2020   Erectile dysfunction due to diseases classified elsewhere 03/26/2019   Rash and nonspecific skin eruption 12/12/2018   Hyperlipidemia associated with type 2 diabetes mellitus (HCC) 05/11/2018   Leg DVT (deep venous thromboembolism), chronic, right (HCC) 07/20/2017   Asymptomatic HIV infection (HCC) 07/19/2017   GERD (gastroesophageal reflux disease) 07/19/2017   Cough 07/15/2017   Thyroid  goiter 05/19/2017   HIV disease (HCC) 04/21/2017   Healthcare maintenance 04/21/2017   Hyperlipidemia 04/21/2017     Problem List Items Addressed This Visit       Cardiovascular and Mediastinum   Essential hypertension   Blood pressure appears adequately controlled with current regimen of lisinopril . No neurological/ophthalmologic signs/symptoms. Encouraged to monitor blood pressure at home. Continue current dose of lisinopril .       Relevant Medications   atorvastatin  (LIPITOR) 40 MG tablet   lisinopril  (ZESTRIL ) 40 MG tablet      Other   HIV disease (HCC)   Mr. Ganci continues to have well controlled virus with good adherence and tolerance to Biktarvy .  Reviewed lab work and discussed plan of care, U equals U, and family planning. Social determinants of health reviewed and no interventions indicated. Check lab work. Continue current dose of Biktarvy . Plan for follow up in  6 months or sooner if needed with lab work on the same day..       Relevant Medications   bictegravir-emtricitabine -tenofovir  AF (BIKTARVY ) 50-200-25 MG TABS tablet   Other Relevant Orders   COMPLETE METABOLIC PANEL WITHOUT GFR   HIV-1 RNA quant-no reflex-bld   T-helper cells (CD4) count (not at [facility])   Healthcare maintenance   Discussed importance of safe sexual practice and condom use. Condoms and site specific STD testing offered.  Vaccinations reviewed  Due for dental care - scheduling information provided in AVS.  Colon cancer screening up to date.       Hyperlipidemia   Increased risk for cardiovascular disease and tolerating atorvastatin  with no adverse side effects or myalgias. Counseled on recommendation to continue atorvastatin  to reduce cardiovascular disease risk and HIV associated inflammation. Continue current dose of atorvastatin .       Relevant Medications   atorvastatin  (LIPITOR) 40 MG tablet   lisinopril  (ZESTRIL ) 40 MG tablet   Erectile dysfunction due to diseases classified elsewhere - Primary   Other Visit Diagnoses       Type 2 diabetes mellitus without complication, without long-term current use of insulin (HCC)       Relevant Medications   atorvastatin  (LIPITOR) 40 MG tablet   lisinopril  (ZESTRIL ) 40 MG tablet   metFORMIN  (GLUCOPHAGE -XR) 500 MG 24 hr tablet   Other Relevant Orders  HgB A1c        I have discontinued Myrtie Atkinson Weist's Zoster Vaccine Adjuvanted and methylPREDNISolone . I am also having him maintain his aspirin , triamcinolone  ointment, Biktarvy , atorvastatin , lisinopril , and  metFORMIN .   Meds ordered this encounter  Medications   bictegravir-emtricitabine -tenofovir  AF (BIKTARVY ) 50-200-25 MG TABS tablet    Sig: Take 1 tablet by mouth daily.    Dispense:  30 tablet    Refill:  6    Supervising Provider:   Liane Redman [phone]    Prescription Type::   Renewal   atorvastatin  (LIPITOR) 40 MG tablet    Sig: Take 1 tablet (40 mg total) by mouth daily.    Dispense:  90 tablet    Refill:  0    Supervising Provider:   SNIDER, CYNTHIA [[id number]]   lisinopril  (ZESTRIL ) 40 MG tablet    Sig: TAKE 1 TABLET(40 MG) BY MOUTH DAILY    Dispense:  90 tablet    Refill:  0    Supervising Provider:   SNIDER, CYNTHIA [[id number]]   metFORMIN  (GLUCOPHAGE -XR) 500 MG 24 hr tablet    Sig: Take 1 tablet (500 mg total) by mouth daily with breakfast.    Dispense:  90 tablet    Refill:  0    Supervising Provider:   Liane Redman [[id number]]     Follow-up: Return in about 6 months (around 02/16/2024). or sooner if needed.    Marlan Silva, MSN, FNP-C Nurse Practitioner [facility] for Infectious Disease [facility] Main number: [phone]

## 2023-08-18 ENCOUNTER — Ambulatory Visit: Payer: Self-pay | Admitting: Family

## 2023-08-18 LAB — COMPLETE METABOLIC PANEL WITHOUT GFR
AG Ratio: 1.8 (calc) (ref 1.0–2.5)
ALT: 23 U/L (ref 9–46)
AST: 19 U/L (ref 10–35)
Albumin: 4.2 g/dL (ref 3.6–5.1)
Alkaline phosphatase (APISO): 71 U/L (ref 35–144)
BUN: 14 mg/dL (ref 7–25)
CO2: 28 mmol/L (ref 20–32)
Calcium: 9.4 mg/dL (ref 8.6–10.3)
Chloride: 104 mmol/L (ref 98–110)
Creat: 0.97 mg/dL (ref 0.70–1.28)
Globulin: 2.3 g/dL (ref 1.9–3.7)
Glucose, Bld: 124 mg/dL — ABNORMAL HIGH (ref 65–99)
Potassium: 4.2 mmol/L (ref 3.5–5.3)
Sodium: 138 mmol/L (ref 135–146)
Total Bilirubin: 0.5 mg/dL (ref 0.2–1.2)
Total Protein: 6.5 g/dL (ref 6.1–8.1)

## 2023-08-18 LAB — T-HELPER CELLS (CD4) COUNT (NOT AT ARMC)
Absolute CD4: 542 {cells}/uL (ref 490–1740)
CD4 T Helper %: 31 % (ref 30–61)
Total lymphocyte count: 1775 {cells}/uL (ref 850–3900)

## 2023-08-18 LAB — HIV-1 RNA QUANT-NO REFLEX-BLD
HIV 1 RNA Quant: NOT DETECTED {copies}/mL
HIV-1 RNA Quant, Log: NOT DETECTED {Log_copies}/mL

## 2023-08-18 LAB — HEMOGLOBIN A1C
Hgb A1c MFr Bld: 6.6 % — ABNORMAL HIGH (ref <5.7)
Mean Plasma Glucose: 143 mg/dL
eAG (mmol/L): 7.9 mmol/L

## 2023-09-12 ENCOUNTER — Encounter (INDEPENDENT_AMBULATORY_CARE_PROVIDER_SITE_OTHER): Payer: 59

## 2023-09-13 ENCOUNTER — Other Ambulatory Visit: Payer: Self-pay | Admitting: Family

## 2023-09-13 DIAGNOSIS — B20 Human immunodeficiency virus [HIV] disease: Secondary | ICD-10-CM

## 2023-09-16 ENCOUNTER — Encounter (INDEPENDENT_AMBULATORY_CARE_PROVIDER_SITE_OTHER)

## 2023-09-16 ENCOUNTER — Encounter (INDEPENDENT_AMBULATORY_CARE_PROVIDER_SITE_OTHER): Payer: Self-pay

## 2023-09-19 ENCOUNTER — Telehealth: Payer: Self-pay

## 2023-09-19 NOTE — Telephone Encounter (Signed)
 Please reschedule

## 2023-09-19 NOTE — Telephone Encounter (Signed)
 Copied from CRM [id number]. Topic: Appointments - Scheduling Inquiry for Clinic >> Sep 16, 2023  5:06 PM Sophia H wrote: Reason for CRM: Patient states he was on the phone doing his AWV and the line disconnected, he is calling back in to know if he completed that appointment or if he needs to reschedule? Please advise, [phone]

## 2023-09-19 NOTE — Telephone Encounter (Signed)
 Could you reschedule pt medicare visit

## 2023-09-20 ENCOUNTER — Other Ambulatory Visit: Payer: Self-pay | Admitting: Family

## 2023-09-20 DIAGNOSIS — B20 Human immunodeficiency virus [HIV] disease: Secondary | ICD-10-CM

## 2023-09-22 LAB — HM DIABETES EYE EXAM

## 2023-09-23 ENCOUNTER — Telehealth: Payer: Self-pay

## 2023-09-23 NOTE — Telephone Encounter (Signed)
 Patient called office stating he is having trouble filling his Biktarvy . Was told they did not have refills on file. Refills sent in May with 6 refills.  Called and spoke with pharmacy tech at [facility]. States they have prescription on file and will have it ready today.  Julien Odor, RMA

## 2023-10-20 ENCOUNTER — Other Ambulatory Visit: Payer: Self-pay

## 2023-10-20 ENCOUNTER — Ambulatory Visit

## 2023-11-01 ENCOUNTER — Other Ambulatory Visit: Payer: Self-pay | Admitting: Family

## 2023-11-01 DIAGNOSIS — E119 Type 2 diabetes mellitus without complications: Secondary | ICD-10-CM

## 2023-11-15 ENCOUNTER — Other Ambulatory Visit: Payer: Self-pay | Admitting: Family

## 2023-11-15 DIAGNOSIS — E782 Mixed hyperlipidemia: Secondary | ICD-10-CM

## 2023-11-15 DIAGNOSIS — I10 Essential (primary) hypertension: Secondary | ICD-10-CM

## 2023-12-20 ENCOUNTER — Ambulatory Visit: Attending: Primary Care

## 2023-12-20 ENCOUNTER — Telehealth: Payer: Self-pay

## 2023-12-20 DIAGNOSIS — Z111 Encounter for screening for respiratory tuberculosis: Secondary | ICD-10-CM | POA: Diagnosis not present

## 2023-12-20 NOTE — Progress Notes (Signed)
 Tuberculin skin test applied to right ventral forearm.

## 2023-12-20 NOTE — Telephone Encounter (Signed)
 Copied from CRM [id number]. Topic: Appointments - Scheduling Inquiry for Clinic >> Dec 19, 2023  2:53 PM Rosaria E wrote: Reason for CRM: Pt wants TB test this week, please advise    ----------------------------------------------------------------------- From previous Reason for Contact - Scheduling: Patient/patient representative is calling to schedule an appointment. Refer to attachments for appointment information.     Resolved: Called patient and informed him that the office is closed, he agreed to come to [facility] to get test done. Address was given and appointment made for today.

## 2023-12-22 ENCOUNTER — Ambulatory Visit: Attending: Family Medicine

## 2023-12-22 LAB — TB SKIN TEST
Induration: 0 mm
TB Skin Test: NEGATIVE

## 2023-12-29 ENCOUNTER — Other Ambulatory Visit (HOSPITAL_BASED_OUTPATIENT_CLINIC_OR_DEPARTMENT_OTHER): Payer: Self-pay

## 2024-01-03 ENCOUNTER — Ambulatory Visit (INDEPENDENT_AMBULATORY_CARE_PROVIDER_SITE_OTHER): Admitting: Primary Care

## 2024-01-09 ENCOUNTER — Ambulatory Visit (INDEPENDENT_AMBULATORY_CARE_PROVIDER_SITE_OTHER): Admitting: Primary Care

## 2024-01-18 NOTE — Progress Notes (Signed)
 Sue Mcalexander                                          MRN: [id number]   01/18/2024   The VBCI Quality Team Specialist reviewed this patient medical record for the purposes of chart review for care gap closure. The following were reviewed: abstraction for care gap closure-glycemic status assessment.Richard Ritchey                                          MRN: [id number]   01/18/2024   The VBCI Quality Team Specialist reviewed this patient medical record for the purposes of chart review for care gap closure. The following were reviewed: chart review for care gap closure-kidney health evaluation for diabetes:eGFR  and uACR.    VBCI Quality Team      VBCI Quality Team

## 2024-01-26 ENCOUNTER — Other Ambulatory Visit: Payer: Self-pay | Admitting: Family

## 2024-01-26 DIAGNOSIS — E119 Type 2 diabetes mellitus without complications: Secondary | ICD-10-CM

## 2024-02-07 ENCOUNTER — Other Ambulatory Visit: Payer: Self-pay

## 2024-02-07 ENCOUNTER — Encounter: Payer: Self-pay | Admitting: Family

## 2024-02-07 ENCOUNTER — Ambulatory Visit (INDEPENDENT_AMBULATORY_CARE_PROVIDER_SITE_OTHER): Admitting: Family

## 2024-02-07 VITALS — BP 139/74 | HR 64 | Temp 98.0°F | Ht 67.0 in | Wt 175.0 lb

## 2024-02-07 DIAGNOSIS — E119 Type 2 diabetes mellitus without complications: Secondary | ICD-10-CM

## 2024-02-07 DIAGNOSIS — Z23 Encounter for immunization: Secondary | ICD-10-CM

## 2024-02-07 DIAGNOSIS — B20 Human immunodeficiency virus [HIV] disease: Secondary | ICD-10-CM | POA: Diagnosis not present

## 2024-02-07 DIAGNOSIS — I10 Essential (primary) hypertension: Secondary | ICD-10-CM

## 2024-02-07 DIAGNOSIS — E782 Mixed hyperlipidemia: Secondary | ICD-10-CM | POA: Diagnosis not present

## 2024-02-07 DIAGNOSIS — Z9189 Other specified personal risk factors, not elsewhere classified: Secondary | ICD-10-CM

## 2024-02-07 DIAGNOSIS — Z0000 Encounter for general adult medical examination without abnormal findings: Secondary | ICD-10-CM

## 2024-02-07 MED ORDER — LISINOPRIL 40 MG PO TABS
ORAL_TABLET | ORAL | 1 refills | Status: AC
Start: 2024-02-07 — End: ?

## 2024-02-07 MED ORDER — METFORMIN HCL ER 500 MG PO TB24: 500.0000 mg | ORAL_TABLET | Freq: Every day | ORAL | 1 refills | Status: AC

## 2024-02-07 MED ORDER — BIKTARVY 50-200-25 MG PO TABS
1.0000 | ORAL_TABLET | Freq: Every day | ORAL | 6 refills | Status: AC
Start: 2024-02-07 — End: ?

## 2024-02-07 MED ORDER — ATORVASTATIN CALCIUM 40 MG PO TABS
40.0000 mg | ORAL_TABLET | Freq: Every day | ORAL | 1 refills | Status: AC
Start: 2024-02-07 — End: ?

## 2024-02-07 NOTE — Assessment & Plan Note (Signed)
 Mr. Wardlow continues to have well-controlled virus and good adherence and tolerance to Biktarvy .  Reviewed previous lab work and discussed plan of care and U equals U.  No problems obtaining medication from the pharmacy and covered by [employer].  Social determinants of health reviewed with no interventions indicated.  Continue current dose of Biktarvy .  Check blood work.  Plan for follow-up in 6 months or sooner if needed with lab work on the same day.

## 2024-02-07 NOTE — Progress Notes (Signed)
 Brief Narrative   Patient ID: Juan Johnston, male    DOB: 05-04-1950, 73 y.o.   MRN: [id number]  Juan Johnston is a 73 year old African-American male diagnosed with HIV disease in December 2018 with risk factor of MSM.  Initial CD4 count of 70 8 and viral load of 1.2 million.  Initial genotype performed on 04/21/2017 with K103 (efavirenz and nevirapine) and E138G (rilpivirine).  [id number] negative.  Initial regimen of Symtuza.  Entered care at [facility] stage III.  Now on Biktarvy .   Subjective:   Chief Complaint  Patient presents with   Follow-up    B20 f/u, no concerns    HPI:  Juan Johnston is a 73 y.o. male with HIV disease last seen on 08/16/2023 with well-controlled virus and good adherence and tolerance to Biktarvy .  Viral load was undetectable with CD4 count 542.  Kidney function, liver function, electrolytes within normal ranges.  Here today for routine follow-up.  Juan Johnston been doing okay since his last office visit and continues to take Biktarvy  as prescribed with no adverse side effects or problems obtaining medication from the pharmacy.  Covered by [employer].  No new concerns/complaints.  Housing, transportation, and access to food stable.  Healthcare maintenance reviewed.  Condoms and site-specific STD testing offered.  Denies fevers, chills, night sweats, headaches, changes in vision, neck pain/stiffness, nausea, diarrhea, vomiting, lesions or rashes.  Lab Results  Component Value Date   CD4TCELL 31 08/16/2023   CD4TABS 490 02/08/2023   Lab Results  Component Value Date   HIV1RNAQUANT NOT DETECTED 08/16/2023     No Known Allergies    Outpatient Medications Prior to Visit  Medication Sig Dispense Refill   aspirin  81 MG chewable tablet Chew 1 tablet (81 mg total) by mouth daily. 30 tablet 0   triamcinolone  ointment (KENALOG ) 0.5 % Apply 1 Application topically 2 (two) times daily. 45 g 6   atorvastatin  (LIPITOR) 40 MG tablet TAKE 1  TABLET(40 MG) BY MOUTH DAILY 90 tablet 1   bictegravir-emtricitabine -tenofovir  AF (BIKTARVY ) 50-200-25 MG TABS tablet Take 1 tablet by mouth daily. 30 tablet 6   lisinopril  (ZESTRIL ) 40 MG tablet TAKE 1 TABLET(40 MG) BY MOUTH DAILY 90 tablet 1   metFORMIN  (GLUCOPHAGE -XR) 500 MG 24 hr tablet TAKE 1 TABLET(500 MG) BY MOUTH DAILY WITH BREAKFAST 90 tablet 0   No facility-administered medications prior to visit.     Past Medical History:  Diagnosis Date   HIV infection (HCC)    Thyroid  goiter      Past Surgical History:  Procedure Laterality Date   CORONARY ULTRASOUND/IVUS N/A 07/21/2017   Procedure: Intravascular Ultrasound/IVUS;  Surgeon: Sheree Penne Bruckner, MD;  Location: [facility] INVASIVE CV LAB;  Service: Cardiovascular;  Laterality: N/A;   PERIPHERAL VASCULAR THROMBECTOMY N/A 07/20/2017   Procedure: PERIPHERAL VASCULAR THROMBECTOMY;  Surgeon: Serene Gaile ORN, MD;  Location: [facility] INVASIVE CV LAB;  Service: Cardiovascular;  Laterality: N/A;   PERIPHERAL VASCULAR THROMBECTOMY N/A 07/21/2017   Procedure: PERIPHERAL VASCULAR THROMBECTOMY - LYSIS RECHECK;  Surgeon: Sheree Penne Bruckner, MD;  Location: [facility] INVASIVE CV LAB;  Service: Cardiovascular;  Laterality: N/A;   scrotum cyst          Review of Systems  Constitutional:  Negative for appetite change, chills, fatigue, fever and unexpected weight change.  Eyes:  Negative for visual disturbance.  Respiratory:  Negative for cough, chest tightness, shortness of breath and wheezing.   Cardiovascular:  Negative for chest pain and leg swelling.  Gastrointestinal:  Negative  for abdominal pain, constipation, diarrhea, nausea and vomiting.  Genitourinary:  Negative for dysuria, flank pain, frequency, genital sores, hematuria and urgency.  Skin:  Negative for rash.  Allergic/Immunologic: Negative for immunocompromised state.  Neurological:  Negative for dizziness and headaches.     Objective:   BP 139/74   Pulse 64   Temp 98 F  (36.7 C) (Temporal)   Ht 5' 7 (1.702 m)   Wt 175 lb (79.4 kg)   SpO2 96%   BMI 27.41 kg/m  Nursing note and vital signs reviewed.  Physical Exam Constitutional:      General: He is not in acute distress.    Appearance: He is well-developed.  Eyes:     Conjunctiva/sclera: Conjunctivae normal.  Cardiovascular:     Rate and Rhythm: Normal rate and regular rhythm.     Heart sounds: Normal heart sounds. No murmur heard.    No friction rub. No gallop.  Pulmonary:     Effort: Pulmonary effort is normal. No respiratory distress.     Breath sounds: Normal breath sounds. No wheezing or rales.  Chest:     Chest wall: No tenderness.  Abdominal:     General: Bowel sounds are normal.     Palpations: Abdomen is soft.     Tenderness: There is no abdominal tenderness.  Musculoskeletal:     Cervical back: Neck supple.  Lymphadenopathy:     Cervical: No cervical adenopathy.  Skin:    General: Skin is warm and dry.     Findings: No rash.  Neurological:     Mental Status: He is alert and oriented to person, place, and time.          08/16/2023    1:41 PM 02/08/2023    2:39 PM 02/08/2023    2:38 PM 11/10/2022    1:55 PM 09/06/2022    3:45 PM  Depression screen PHQ 2/9  Decreased Interest 0 0 0 0 0  Down, Depressed, Hopeless 0 0 0 0 0  PHQ - 2 Score 0 0 0 0 0  Altered sleeping 0      Tired, decreased energy 0      Change in appetite 0      Feeling bad or failure about yourself  0      Trouble concentrating 0      Moving slowly or fidgety/restless 0      Suicidal thoughts 0      PHQ-9 Score 0            08/16/2023    1:41 PM 11/10/2022    1:55 PM 08/10/2022    2:11 PM 02/08/2022    2:22 PM  GAD 7 : Generalized Anxiety Score  Nervous, Anxious, on Edge 0 0 0 0  Control/stop worrying 0 0 0 0  Worry too much - different things 0 0 0 0  Trouble relaxing 0 0 0 0  Restless 0 0 0 0  Easily annoyed or irritable 0 0 0 0  Afraid - awful might happen 0 0 0 0  Total GAD 7 Score 0 0 0 0      The 10-year ASCVD risk score (Arnett DK, et al., 2019) is: 61.4%   Values used to calculate the score:     Age: 73 years     Clincally relevant sex: Male     Is Non-Hispanic African American: Yes     Diabetic: Yes     Tobacco smoker: Yes     Systolic Blood  Pressure: 139 mmHg     Is BP treated: Yes     HDL Cholesterol: 41 mg/dL     Total Cholesterol: 195 mg/dL      Assessment & Plan:    Patient Active Problem List   Diagnosis Date Noted   Shingles 03/23/2022   Contact dermatitis and eczema 03/23/2022   Essential hypertension 11/10/2020   Erectile dysfunction due to diseases classified elsewhere 03/26/2019   Rash and nonspecific skin eruption 12/12/2018   Hyperlipidemia associated with type 2 diabetes mellitus (HCC) 05/11/2018   Leg DVT (deep venous thromboembolism), chronic, right (HCC) 07/20/2017   Asymptomatic HIV infection (HCC) 07/19/2017   GERD (gastroesophageal reflux disease) 07/19/2017   Cough 07/15/2017   Thyroid  goiter 05/19/2017   HIV disease (HCC) 04/21/2017   Healthcare maintenance 04/21/2017   Hyperlipidemia 04/21/2017     Problem List Items Addressed This Visit       Cardiovascular and Mediastinum   Essential hypertension   Blood pressure adequately controlled and below goal 140/90 with current dose of lisinopril .  No neurologic/ophthalmologic signs/symptoms.  Does not currently check blood pressure at home.  Encouraged follow low-sodium/Dash type diet.  Monitor blood pressure at home as able.  Continue current dose of lisinopril .      Relevant Medications   lisinopril  (ZESTRIL ) 40 MG tablet   atorvastatin  (LIPITOR) 40 MG tablet     Other   HIV disease (HCC) - Primary   Juan Johnston continues to have well-controlled virus and good adherence and tolerance to Biktarvy .  Reviewed previous lab work and discussed plan of care and U equals U.  No problems obtaining medication from the pharmacy and covered by [employer].  Social  determinants of health reviewed with no interventions indicated.  Continue current dose of Biktarvy .  Check blood work.  Plan for follow-up in 6 months or sooner if needed with lab work on the same day.      Relevant Medications   bictegravir-emtricitabine -tenofovir  AF (BIKTARVY ) 50-200-25 MG TABS tablet   Other Relevant Orders   Comprehensive metabolic panel with GFR   HIV-1 RNA quant-no reflex-bld   T-helper cell (CD4)- ([facility] clinic only)   Healthcare maintenance   Discussed importance of safe sexual practice and condom use. Condoms and site specific STD testing offered.  Vaccinations reviewed and influenza updated following counseling. Due for colon cancer screening with cologuard ordered.  Due for dental care.       Hyperlipidemia   Juan Johnston is tolerating atorvastatin  as prescribed with no adverse side effects or myalgias.  Discussed plan of care to continue current dose of atorvastatin  to reduce cardiovascular disease risk as well as HIV associated inflammation.      Relevant Medications   lisinopril  (ZESTRIL ) 40 MG tablet   atorvastatin  (LIPITOR) 40 MG tablet   Other Visit Diagnoses       At risk for colon cancer       Relevant Orders   Cologuard     Type 2 diabetes mellitus without complication, without long-term current use of insulin (HCC)       Relevant Medications   lisinopril  (ZESTRIL ) 40 MG tablet   atorvastatin  (LIPITOR) 40 MG tablet   metFORMIN  (GLUCOPHAGE -XR) 500 MG 24 hr tablet     Encounter for immunization       Relevant Orders   Flu vaccine HIGH DOSE PF(Fluzone Trivalent) (Completed)        I have changed Juan Johnston's atorvastatin  and metFORMIN . I am also having him maintain his  aspirin , triamcinolone  ointment, Biktarvy , and lisinopril .   Meds ordered this encounter  Medications   bictegravir-emtricitabine -tenofovir  AF (BIKTARVY ) 50-200-25 MG TABS tablet    Sig: Take 1 tablet by mouth daily.    Dispense:  30 tablet    Refill:  6     Supervising Provider:   SNIDER, CYNTHIA [phone]    Prescription Type::   Renewal   lisinopril  (ZESTRIL ) 40 MG tablet    Sig: TAKE 1 TABLET(40 MG) BY MOUTH DAILY    Dispense:  90 tablet    Refill:  1    Supervising Provider:   SNIDER, CYNTHIA [[id number]]   atorvastatin  (LIPITOR) 40 MG tablet    Sig: Take 1 tablet (40 mg total) by mouth daily.    Dispense:  90 tablet    Refill:  1    Supervising Provider:   SNIDER, CYNTHIA [[id number]]   metFORMIN  (GLUCOPHAGE -XR) 500 MG 24 hr tablet    Sig: Take 1 tablet (500 mg total) by mouth daily with breakfast.    Dispense:  90 tablet    Refill:  1    Supervising Provider:   LUIZ CHANNEL [[id number]]     Follow-up: Return in about 6 months (around 08/06/2024). or sooner if needed.    Cathlyn July, MSN, FNP-C Nurse Practitioner [facility] Main number: [phone]

## 2024-02-07 NOTE — Assessment & Plan Note (Signed)
 Mr. No is tolerating atorvastatin  as prescribed with no adverse side effects or myalgias.  Discussed plan of care to continue current dose of atorvastatin  to reduce cardiovascular disease risk as well as HIV associated inflammation.

## 2024-02-07 NOTE — Assessment & Plan Note (Signed)
 Blood pressure adequately controlled and below goal 140/90 with current dose of lisinopril .  No neurologic/ophthalmologic signs/symptoms.  Does not currently check blood pressure at home.  Encouraged follow low-sodium/Dash type diet.  Monitor blood pressure at home as able.  Continue current dose of lisinopril .

## 2024-02-07 NOTE — Assessment & Plan Note (Signed)
 Discussed importance of safe sexual practice and condom use. Condoms and site specific STD testing offered.  Vaccinations reviewed and influenza updated following counseling. Due for colon cancer screening with cologuard ordered.  Due for dental care.

## 2024-02-07 NOTE — Patient Instructions (Addendum)
 Nice to see you.  We will check your lab work today.  Continue to take your medication daily as prescribed.  Refills have been sent to the pharmacy.  Plan for follow up in 6 months or sooner if needed with lab work on the same day.  Have a great day and stay safe!   Smoking Cessation: QuitlineNC 1-800-QUIT-NOW (248)712-7172); Espaol: 1-855-Djelo-Ya (1-954-718-9200) http://carroll-castaneda.info/

## 2024-02-08 LAB — T-HELPER CELL (CD4) - (RCID CLINIC ONLY)
CD4 % Helper T Cell: 30 % — ABNORMAL LOW (ref 33–65)
CD4 T Cell Abs: 472 /uL (ref 400–1790)

## 2024-02-10 LAB — COMPREHENSIVE METABOLIC PANEL WITH GFR
AG Ratio: 1.9 (calc) (ref 1.0–2.5)
ALT: 22 U/L (ref 9–46)
AST: 16 U/L (ref 10–35)
Albumin: 4.3 g/dL (ref 3.6–5.1)
Alkaline phosphatase (APISO): 64 U/L (ref 35–144)
BUN: 17 mg/dL (ref 7–25)
CO2: 29 mmol/L (ref 20–32)
Calcium: 9.4 mg/dL (ref 8.6–10.3)
Chloride: 104 mmol/L (ref 98–110)
Creat: 1.03 mg/dL (ref 0.70–1.28)
Globulin: 2.3 g/dL (ref 1.9–3.7)
Glucose, Bld: 98 mg/dL (ref 65–99)
Potassium: 4.4 mmol/L (ref 3.5–5.3)
Sodium: 140 mmol/L (ref 135–146)
Total Bilirubin: 0.7 mg/dL (ref 0.2–1.2)
Total Protein: 6.6 g/dL (ref 6.1–8.1)
eGFR: 77 mL/min/1.73m2 (ref >=60)

## 2024-02-10 LAB — HIV-1 RNA QUANT-NO REFLEX-BLD
HIV 1 RNA Quant: NOT DETECTED {copies}/mL
HIV-1 RNA Quant, Log: NOT DETECTED {Log_copies}/mL

## 2024-02-13 ENCOUNTER — Ambulatory Visit: Payer: Self-pay | Admitting: Family

## 2024-02-22 ENCOUNTER — Telehealth: Payer: Self-pay

## 2024-02-22 NOTE — Telephone Encounter (Signed)
 Received call from [facility] requesting new ICD code for Cologuard screening. Provided verbal to attach Z12.11 for encounter for screening for malignant neoplasm of colon.  Khyle Goodell, BSN, RN

## 2024-03-03 LAB — COLOGUARD: COLOGUARD: NEGATIVE

## 2024-03-05 ENCOUNTER — Ambulatory Visit (INDEPENDENT_AMBULATORY_CARE_PROVIDER_SITE_OTHER): Admitting: Primary Care

## 2024-03-05 ENCOUNTER — Encounter (INDEPENDENT_AMBULATORY_CARE_PROVIDER_SITE_OTHER): Payer: Self-pay | Admitting: Primary Care

## 2024-03-05 VITALS — BP 150/78 | HR 67 | Resp 16 | Ht 67.0 in | Wt 177.0 lb

## 2024-03-05 DIAGNOSIS — I10 Essential (primary) hypertension: Secondary | ICD-10-CM

## 2024-03-05 DIAGNOSIS — E782 Mixed hyperlipidemia: Secondary | ICD-10-CM | POA: Diagnosis not present

## 2024-03-05 DIAGNOSIS — E119 Type 2 diabetes mellitus without complications: Secondary | ICD-10-CM

## 2024-03-05 DIAGNOSIS — Z7984 Long term (current) use of oral hypoglycemic drugs: Secondary | ICD-10-CM

## 2024-03-05 LAB — POCT GLYCOSYLATED HEMOGLOBIN (HGB A1C): HbA1c, POC (controlled diabetic range): 6 % (ref 0.0–7.0)

## 2024-03-05 NOTE — Progress Notes (Signed)
 [facility]  Juan Johnston, is a 73 y.o. male  RDW:[id number]  FMW:[id number]  DOB - 16-Aug-1950  Chief Complaint  Patient presents with   Diabetes   Hypertension   Hyperlipidemia       Subjective:   Juan Johnston is a 73 y.o. male here today for a follow up visit.  Hypertension patient has No headache, No chest pain, No abdominal pain - No Nausea, No new weakness tingling or numbness, No Cough - shortness of breath.  Checks BP at home and ranges from systolic 120s and 135 diastolic 70-82 s diabetes will check her A1c today last 1 6 months ago was 6.6 on metformin  XR daily prescribed by outside provider On atorvastatin  40 mg prescribed by outside provider will get a lipid panel when patient comes in fasting orders are done for right Probably needs No problems updated.  Comprehensive ROS Pertinent positive and negative noted in HPI   No Known Allergies  Past Medical History:  Diagnosis Date   HIV infection (HCC)    Thyroid  goiter     Current Outpatient Medications on File Prior to Visit  Medication Sig Dispense Refill   aspirin  81 MG chewable tablet Chew 1 tablet (81 mg total) by mouth daily. 30 tablet 0   atorvastatin  (LIPITOR) 40 MG tablet Take 1 tablet (40 mg total) by mouth daily. 90 tablet 1   bictegravir-emtricitabine -tenofovir  AF (BIKTARVY ) 50-200-25 MG TABS tablet Take 1 tablet by mouth daily. 30 tablet 6   lisinopril  (ZESTRIL ) 40 MG tablet TAKE 1 TABLET(40 MG) BY MOUTH DAILY 90 tablet 1   metFORMIN  (GLUCOPHAGE -XR) 500 MG 24 hr tablet Take 1 tablet (500 mg total) by mouth daily with breakfast. 90 tablet 1   triamcinolone  ointment (KENALOG ) 0.5 % Apply 1 Application topically 2 (two) times daily. 45 g 6   No current facility-administered medications on file prior to visit.   Health Maintenance  Topic Date Due   Yearly kidney health urinalysis for diabetes  Never done   Complete foot exam   02/09/2023   Medicare Annual Wellness Visit   09/06/2023   COVID-19 Vaccine (6 - 2025-26 season) 12/05/2023   Hemoglobin A1C  09/03/2024   Eye exam for diabetics  09/21/2024   Yearly kidney function blood test for diabetes  02/06/2025   Cologuard (Stool DNA test)  02/27/2027   DTaP/Tdap/Td vaccine (2 - Td or Tdap) 11/29/2028   Pneumococcal Vaccine for age over 72  Completed   Flu Shot  Completed   Hepatitis C Screening  Completed   Zoster (Shingles) Vaccine  Completed   Meningitis B Vaccine  Aged Out   Hepatitis B Vaccine  Discontinued    Objective:   Vitals:   03/05/24 1612 03/05/24 1622  BP: (!) 173/90 (!) 150/78  Pulse: 67   Resp: 16   SpO2: 97%   Weight: 177 lb (80.3 kg)   Height: 5' 7 (1.702 m)      Physical Exam Vitals reviewed.  HENT:     Head: Normocephalic.     Right Ear: Tympanic membrane and external ear normal.     Left Ear: Tympanic membrane, ear canal and external ear normal.     Ears:     Comments: Red     Nose: Nose normal.  Eyes:     Extraocular Movements: Extraocular movements intact.     Pupils: Pupils are equal, round, and reactive to light.  Neck:     Comments: goiter Cardiovascular:     Rate and  Rhythm: Normal rate and regular rhythm.  Pulmonary:     Effort: Pulmonary effort is normal.     Breath sounds: Normal breath sounds.  Abdominal:     General: Bowel sounds are normal. There is distension.     Palpations: Abdomen is soft.  Musculoskeletal:        General: Normal range of motion.     Cervical back: Normal range of motion.  Skin:    General: Skin is warm and dry.  Neurological:     Mental Status: He is alert and oriented to person, place, and time.  Psychiatric:        Mood and Affect: Mood normal.        Behavior: Behavior normal.        Thought Content: Thought content normal.        Judgment: Judgment normal.    Assessment & Plan  Juan Johnston was seen today for diabetes, hypertension and hyperlipidemia.  Diagnoses and all orders for this visit:  Type 2 diabetes mellitus  without complication, without long-term current use of insulin (HCC) -     POCT glycosylated hemoglobin (Hb A1C)  Essential hypertension BP goal - <140/90 Explained that having normal blood pressure is the goal and medications are helping to get to goal and maintain normal blood pressure. DIET: Limit salt intake, read nutrition labels to check salt content, limit fried and high fatty foods  Avoid using multisymptom OTC cold preparations that generally contain sudafed which can rise BP. Consult with pharmacist on best cold relief products to use for persons with HTN EXERCISE Discussed incorporating exercise such as walking - 30 minutes most days of the week and can do in 10 minute intervals     Mixed hyperlipidemia -     Lipid panel; Future    Patient have been counseled extensively about nutrition and exercise. Other issues discussed during this visit include: low cholesterol diet, weight control and daily exercise, foot care, annual eye examinations at Ophthalmology, importance of adherence with medications and regular follow-up. We also discussed long term complications of uncontrolled diabetes and hypertension.   Return in about 3 months (around 06/03/2024) for fasting labs.  The patient was given clear instructions to go to ER or return to medical center if symptoms don't improve, worsen or new problems develop. The patient verbalized understanding. The patient was told to call to get lab results if they haven't heard anything in the next week.   This note has been created with Education officer, environmental. Any transcriptional errors are unintentional.   Juan Juan Bohr, NP 03/05/2024, 5:26 PM

## 2024-04-24 ENCOUNTER — Other Ambulatory Visit: Payer: Self-pay | Admitting: Family

## 2024-04-24 DIAGNOSIS — B20 Human immunodeficiency virus [HIV] disease: Secondary | ICD-10-CM

## 2024-05-29 ENCOUNTER — Ambulatory Visit (INDEPENDENT_AMBULATORY_CARE_PROVIDER_SITE_OTHER): Payer: Self-pay

## 2024-06-04 ENCOUNTER — Ambulatory Visit (INDEPENDENT_AMBULATORY_CARE_PROVIDER_SITE_OTHER): Admitting: Primary Care

## 2024-08-14 ENCOUNTER — Ambulatory Visit: Admitting: Family
